# Patient Record
Sex: Female | Born: 1965
Health system: Southern US, Community
[De-identification: ages and names within clinical notes are randomized; demographics above are authoritative.]

## PROBLEM LIST (undated history)

## (undated) DIAGNOSIS — M48061 Spinal stenosis, lumbar region without neurogenic claudication: Secondary | ICD-10-CM

## (undated) DIAGNOSIS — E785 Hyperlipidemia, unspecified: Secondary | ICD-10-CM

## (undated) DIAGNOSIS — A159 Respiratory tuberculosis unspecified: Secondary | ICD-10-CM

## (undated) DIAGNOSIS — M1990 Unspecified osteoarthritis, unspecified site: Secondary | ICD-10-CM

## (undated) DIAGNOSIS — I10 Essential (primary) hypertension: Secondary | ICD-10-CM

## (undated) DIAGNOSIS — G709 Myoneural disorder, unspecified: Secondary | ICD-10-CM

## (undated) DIAGNOSIS — E119 Type 2 diabetes mellitus without complications: Secondary | ICD-10-CM

## (undated) DIAGNOSIS — K219 Gastro-esophageal reflux disease without esophagitis: Secondary | ICD-10-CM

## (undated) DIAGNOSIS — Z9109 Other allergy status, other than to drugs and biological substances: Secondary | ICD-10-CM

## (undated) DIAGNOSIS — I499 Cardiac arrhythmia, unspecified: Secondary | ICD-10-CM

## (undated) HISTORY — DX: Hyperlipidemia, unspecified: E78.5

## (undated) HISTORY — DX: Unspecified osteoarthritis, unspecified site: M19.90

## (undated) HISTORY — DX: Type 2 diabetes mellitus without complications: E11.9

## (undated) HISTORY — PX: HAND SURGERY: SHX662

## (undated) HISTORY — DX: Spinal stenosis, lumbar region without neurogenic claudication: M48.061

## (undated) HISTORY — PX: ANKLE SURGERY: SHX546

---

## 2001-04-02 ENCOUNTER — Emergency Department (HOSPITAL_COMMUNITY): Admission: EM | Admit: 2001-04-02 | Discharge: 2001-04-02 | Payer: Self-pay | Admitting: Emergency Medicine

## 2001-07-13 ENCOUNTER — Other Ambulatory Visit: Admission: RE | Admit: 2001-07-13 | Discharge: 2001-07-13 | Payer: Self-pay | Admitting: Family Medicine

## 2001-12-16 ENCOUNTER — Emergency Department (HOSPITAL_COMMUNITY): Admission: EM | Admit: 2001-12-16 | Discharge: 2001-12-16 | Payer: Self-pay | Admitting: Emergency Medicine

## 2001-12-16 ENCOUNTER — Encounter: Payer: Self-pay | Admitting: Emergency Medicine

## 2001-12-29 ENCOUNTER — Emergency Department (HOSPITAL_COMMUNITY): Admission: EM | Admit: 2001-12-29 | Discharge: 2001-12-29 | Payer: Self-pay | Admitting: Emergency Medicine

## 2002-01-26 ENCOUNTER — Emergency Department (HOSPITAL_COMMUNITY): Admission: EM | Admit: 2002-01-26 | Discharge: 2002-01-26 | Payer: Self-pay | Admitting: Emergency Medicine

## 2002-02-13 ENCOUNTER — Emergency Department (HOSPITAL_COMMUNITY): Admission: EM | Admit: 2002-02-13 | Discharge: 2002-02-13 | Payer: Self-pay | Admitting: Emergency Medicine

## 2002-02-25 ENCOUNTER — Emergency Department (HOSPITAL_COMMUNITY): Admission: EM | Admit: 2002-02-25 | Discharge: 2002-02-25 | Payer: Self-pay | Admitting: Emergency Medicine

## 2002-08-30 ENCOUNTER — Ambulatory Visit (HOSPITAL_COMMUNITY): Admission: RE | Admit: 2002-08-30 | Discharge: 2002-08-30 | Payer: Self-pay | Admitting: Neurology

## 2002-12-10 ENCOUNTER — Encounter: Payer: Self-pay | Admitting: Emergency Medicine

## 2002-12-10 ENCOUNTER — Emergency Department (HOSPITAL_COMMUNITY): Admission: EM | Admit: 2002-12-10 | Discharge: 2002-12-10 | Payer: Self-pay | Admitting: Emergency Medicine

## 2003-03-15 ENCOUNTER — Emergency Department (HOSPITAL_COMMUNITY): Admission: EM | Admit: 2003-03-15 | Discharge: 2003-03-15 | Payer: Self-pay | Admitting: Emergency Medicine

## 2003-06-10 ENCOUNTER — Emergency Department (HOSPITAL_COMMUNITY): Admission: EM | Admit: 2003-06-10 | Discharge: 2003-06-10 | Payer: Self-pay | Admitting: Emergency Medicine

## 2003-07-16 ENCOUNTER — Emergency Department (HOSPITAL_COMMUNITY): Admission: EM | Admit: 2003-07-16 | Discharge: 2003-07-16 | Payer: Self-pay | Admitting: Emergency Medicine

## 2003-08-07 ENCOUNTER — Emergency Department (HOSPITAL_COMMUNITY): Admission: EM | Admit: 2003-08-07 | Discharge: 2003-08-07 | Payer: Self-pay | Admitting: Emergency Medicine

## 2003-08-17 ENCOUNTER — Encounter: Admission: RE | Admit: 2003-08-17 | Discharge: 2003-11-15 | Payer: Self-pay | Admitting: *Deleted

## 2003-11-07 ENCOUNTER — Emergency Department (HOSPITAL_COMMUNITY): Admission: EM | Admit: 2003-11-07 | Discharge: 2003-11-07 | Payer: Self-pay | Admitting: Emergency Medicine

## 2003-11-19 ENCOUNTER — Emergency Department (HOSPITAL_COMMUNITY): Admission: EM | Admit: 2003-11-19 | Discharge: 2003-11-19 | Payer: Self-pay | Admitting: Emergency Medicine

## 2004-01-28 ENCOUNTER — Emergency Department (HOSPITAL_COMMUNITY): Admission: EM | Admit: 2004-01-28 | Discharge: 2004-01-28 | Payer: Self-pay | Admitting: Emergency Medicine

## 2004-06-11 ENCOUNTER — Emergency Department (HOSPITAL_COMMUNITY): Admission: EM | Admit: 2004-06-11 | Discharge: 2004-06-11 | Payer: Self-pay | Admitting: Emergency Medicine

## 2004-12-15 ENCOUNTER — Emergency Department (HOSPITAL_COMMUNITY): Admission: EM | Admit: 2004-12-15 | Discharge: 2004-12-15 | Payer: Self-pay | Admitting: Emergency Medicine

## 2005-11-28 ENCOUNTER — Encounter: Admission: RE | Admit: 2005-11-28 | Discharge: 2005-11-28 | Payer: Self-pay | Admitting: Otolaryngology

## 2005-12-03 ENCOUNTER — Encounter: Admission: RE | Admit: 2005-12-03 | Discharge: 2005-12-03 | Payer: Self-pay | Admitting: Otolaryngology

## 2006-01-11 ENCOUNTER — Emergency Department (HOSPITAL_COMMUNITY): Admission: EM | Admit: 2006-01-11 | Discharge: 2006-01-11 | Payer: Self-pay | Admitting: Emergency Medicine

## 2006-01-13 ENCOUNTER — Emergency Department (HOSPITAL_COMMUNITY): Admission: EM | Admit: 2006-01-13 | Discharge: 2006-01-13 | Payer: Self-pay | Admitting: Emergency Medicine

## 2006-03-04 HISTORY — PX: FOOT SURGERY: SHX648

## 2006-03-06 ENCOUNTER — Emergency Department (HOSPITAL_COMMUNITY): Admission: EM | Admit: 2006-03-06 | Discharge: 2006-03-06 | Payer: Self-pay | Admitting: Family Medicine

## 2006-03-17 ENCOUNTER — Emergency Department (HOSPITAL_COMMUNITY): Admission: EM | Admit: 2006-03-17 | Discharge: 2006-03-17 | Payer: Self-pay | Admitting: Emergency Medicine

## 2006-09-03 ENCOUNTER — Ambulatory Visit (HOSPITAL_BASED_OUTPATIENT_CLINIC_OR_DEPARTMENT_OTHER): Admission: RE | Admit: 2006-09-03 | Discharge: 2006-09-04 | Payer: Self-pay | Admitting: Orthopedic Surgery

## 2006-11-28 ENCOUNTER — Encounter: Admission: RE | Admit: 2006-11-28 | Discharge: 2006-11-28 | Payer: Self-pay | Admitting: Internal Medicine

## 2007-03-18 ENCOUNTER — Emergency Department (HOSPITAL_COMMUNITY): Admission: EM | Admit: 2007-03-18 | Discharge: 2007-03-18 | Payer: Self-pay | Admitting: *Deleted

## 2008-03-04 HISTORY — PX: KNEE SURGERY: SHX244

## 2008-04-07 ENCOUNTER — Encounter: Admission: RE | Admit: 2008-04-07 | Discharge: 2008-04-07 | Payer: Self-pay | Admitting: Cardiology

## 2008-06-02 ENCOUNTER — Encounter: Admission: RE | Admit: 2008-06-02 | Discharge: 2008-06-02 | Payer: Self-pay | Admitting: Orthopedic Surgery

## 2008-07-26 ENCOUNTER — Encounter: Admission: RE | Admit: 2008-07-26 | Discharge: 2008-10-24 | Payer: Self-pay | Admitting: Orthopedic Surgery

## 2009-03-01 ENCOUNTER — Ambulatory Visit (HOSPITAL_COMMUNITY): Admission: RE | Admit: 2009-03-01 | Discharge: 2009-03-01 | Payer: Self-pay | Admitting: Cardiology

## 2009-03-18 ENCOUNTER — Emergency Department (HOSPITAL_COMMUNITY): Admission: EM | Admit: 2009-03-18 | Discharge: 2009-03-18 | Payer: Self-pay | Admitting: Emergency Medicine

## 2009-05-26 ENCOUNTER — Encounter (HOSPITAL_COMMUNITY): Admission: RE | Admit: 2009-05-26 | Discharge: 2009-08-02 | Payer: Self-pay | Admitting: Cardiology

## 2009-10-25 ENCOUNTER — Emergency Department (HOSPITAL_COMMUNITY): Admission: EM | Admit: 2009-10-25 | Discharge: 2009-10-26 | Payer: Self-pay | Admitting: Emergency Medicine

## 2009-12-13 ENCOUNTER — Emergency Department (HOSPITAL_COMMUNITY): Admission: EM | Admit: 2009-12-13 | Discharge: 2009-12-13 | Payer: Self-pay | Admitting: Emergency Medicine

## 2010-03-17 ENCOUNTER — Emergency Department (HOSPITAL_COMMUNITY)
Admission: EM | Admit: 2010-03-17 | Discharge: 2010-03-17 | Payer: Self-pay | Source: Home / Self Care | Admitting: Emergency Medicine

## 2010-03-19 LAB — URINALYSIS, ROUTINE W REFLEX MICROSCOPIC
Bilirubin Urine: NEGATIVE
Ketones, ur: 15 mg/dL — AB
Leukocytes, UA: NEGATIVE
Nitrite: NEGATIVE
Protein, ur: NEGATIVE mg/dL
Specific Gravity, Urine: 1.028 (ref 1.005–1.030)
Urine Glucose, Fasting: >1000 mg/dL — AB
Urobilinogen, UA: 0.2 mg/dL (ref 0.0–1.0)
pH: 6 (ref 5.0–8.0)

## 2010-03-19 LAB — URINE MICROSCOPIC-ADD ON

## 2010-05-17 LAB — GLUCOSE, CAPILLARY: Glucose-Capillary: 145 mg/dL — ABNORMAL HIGH (ref 70–99)

## 2010-05-20 LAB — URINALYSIS, ROUTINE W REFLEX MICROSCOPIC
Bilirubin Urine: NEGATIVE
Glucose, UA: >1000 mg/dL — AB
Hgb urine dipstick: NEGATIVE
Ketones, ur: NEGATIVE mg/dL
Leukocytes, UA: NEGATIVE
Nitrite: NEGATIVE
Protein, ur: NEGATIVE mg/dL
Specific Gravity, Urine: 1.025 (ref 1.005–1.030)
Urobilinogen, UA: 1 mg/dL (ref 0.0–1.0)
pH: 7 (ref 5.0–8.0)

## 2010-05-20 LAB — DIFFERENTIAL
Basophils Absolute: 0 10*3/uL (ref 0.0–0.1)
Basophils Relative: 0 % (ref 0–1)
Eosinophils Relative: 2 % (ref 0–5)
Lymphocytes Relative: 23 % (ref 12–46)
Monocytes Absolute: 0.7 10*3/uL (ref 0.1–1.0)
Monocytes Relative: 5 % (ref 3–12)

## 2010-05-20 LAB — BASIC METABOLIC PANEL
CO2: 24 mEq/L (ref 19–32)
Glucose, Bld: 328 mg/dL — ABNORMAL HIGH (ref 70–99)
Potassium: 3.8 mEq/L (ref 3.5–5.1)
Sodium: 132 mEq/L — ABNORMAL LOW (ref 135–145)

## 2010-05-20 LAB — CBC
HCT: 40.8 % (ref 36.0–46.0)
Hemoglobin: 13.8 g/dL (ref 12.0–15.0)
MCHC: 33.8 g/dL (ref 30.0–36.0)
RDW: 13.6 % (ref 11.5–15.5)

## 2010-05-20 LAB — GLUCOSE, CAPILLARY
Glucose-Capillary: 209 mg/dL — ABNORMAL HIGH (ref 70–99)
Glucose-Capillary: 251 mg/dL — ABNORMAL HIGH (ref 70–99)
Glucose-Capillary: 313 mg/dL — ABNORMAL HIGH (ref 70–99)

## 2010-06-03 ENCOUNTER — Emergency Department (HOSPITAL_COMMUNITY)
Admission: EM | Admit: 2010-06-03 | Discharge: 2010-06-04 | Disposition: A | Discharge status: Home / Self Care | Payer: Managed Care, Other (non HMO) | Attending: Emergency Medicine | Admitting: Emergency Medicine

## 2010-06-03 ENCOUNTER — Emergency Department (HOSPITAL_COMMUNITY): Payer: Managed Care, Other (non HMO)

## 2010-06-03 DIAGNOSIS — M1990 Unspecified osteoarthritis, unspecified site: Secondary | ICD-10-CM | POA: Insufficient documentation

## 2010-06-03 DIAGNOSIS — M25469 Effusion, unspecified knee: Secondary | ICD-10-CM | POA: Insufficient documentation

## 2010-06-03 DIAGNOSIS — M25569 Pain in unspecified knee: Secondary | ICD-10-CM | POA: Insufficient documentation

## 2010-06-03 DIAGNOSIS — I10 Essential (primary) hypertension: Secondary | ICD-10-CM | POA: Insufficient documentation

## 2010-06-03 DIAGNOSIS — E119 Type 2 diabetes mellitus without complications: Secondary | ICD-10-CM | POA: Insufficient documentation

## 2010-06-03 DIAGNOSIS — M779 Enthesopathy, unspecified: Secondary | ICD-10-CM | POA: Insufficient documentation

## 2010-06-04 LAB — COMPREHENSIVE METABOLIC PANEL
ALT: 22 U/L (ref 0–35)
AST: 17 U/L (ref 0–37)
Albumin: 4.1 g/dL (ref 3.5–5.2)
Alkaline Phosphatase: 89 U/L (ref 39–117)
BUN: 10 mg/dL (ref 6–23)
Chloride: 100 mEq/L (ref 96–112)
Potassium: 4 mEq/L (ref 3.5–5.1)
Sodium: 137 mEq/L (ref 135–145)
Total Bilirubin: 0.6 mg/dL (ref 0.3–1.2)
Total Protein: 7.2 g/dL (ref 6.0–8.3)

## 2010-06-04 LAB — HEMOGLOBIN A1C: Mean Plasma Glucose: 200 mg/dL

## 2010-06-04 LAB — CBC
HCT: 41.3 % (ref 36.0–46.0)
Platelets: 379 10*3/uL (ref 150–400)
WBC: 14.3 10*3/uL — ABNORMAL HIGH (ref 4.0–10.5)

## 2010-06-04 LAB — LIPID PANEL
HDL: 35 mg/dL — ABNORMAL LOW (ref >39)
Total CHOL/HDL Ratio: 4.2 RATIO
Triglycerides: 102 mg/dL (ref <150)
VLDL: 20 mg/dL (ref 0–40)

## 2010-07-20 NOTE — Op Note (Signed)
Joy Solis, [name]               ACCOUNT NO.:  [id number]   MEDICAL RECORD NO.:  [id number]          PATIENT TYPE:  AMB   LOCATION:  DSC                          FACILITY:  [facility]   PHYSICIAN:  Leonides Grills, M.D.     DATE OF BIRTH:  Jun 07, 1965   DATE OF PROCEDURE:  08/04/2006  DATE OF DISCHARGE:                               OPERATIVE REPORT   PREOPERATIVE DIAGNOSES:  1. Right hindfoot valgus malalignment.  2. Right tarsal tunnel syndrome.  3. Right tight gastroc.  4. Right grade 2 posterior tibial tendonitis/insufficiency.   POSTOPERATIVE DIAGNOSES:  1. Right hindfoot valgus malalignment.  2. Right tarsal tunnel syndrome.  3. Right tight gastroc.  4. Right grade 2 posterior tibial tendonitis/insufficiency.   OPERATION:  1. Right tarsal tunnel release.  2. Right lengthening calcaneal osteotomy with femoral head allograft.  3. Right FDL to navicular tendon transfer.  4. Right gastroc slide.  5. Stress x-rays right foot.   ANESTHESIA:  General.   SURGEON:  Leonides Grills, MD.   ASSISTANT:  Evlyn Kanner, PA-C.   ESTIMATED BLOOD LOSS:  Minimal.   TOURNIQUET TIME:  Approximately 10 minutes.   COMPLICATIONS:  None.   DISPOSITION:  Stable to PR.   INDICATIONS:  This is a 44 year old female who is obese and has had  prolonged right posteromedial ankle pain that is interfering with her  life __________  despite conservative management.  She was consented for  the above procedure.  All risks which include infection, neurovessel  injury, nonunion, malunion, hardware rotation, hardware failure,  stiffness, arthritis, persistent pain, worsening pain, prolonged  recovery, lateral column pain, lateral foot numbness specifically were  all explained, questions were encouraged and answered.   DESCRIPTION OF PROCEDURE:  The patient was brought to the operating  room, placed in supine position initially after adequate general  endotracheal tube anesthesia was administered with  popliteal block as  well as Ancef 1 gram IV piggyback.  A bump was placed on the right  ipsilateral hip using a beanbag, all bony prominences well padded and  the right lower extremity was then prepped and draped in a sterile  manner over  a proximally placed thigh tourniquet. The limb was gravity  exsanguinated, tourniquet was elevated to 290 mmHg. A longitudinal  incision on the medial aspect of the gastrocnemius muscle tendon  junction was then made and dissection was carried down through the ski,  hemostasis was obtained. The conjoined region was then developed between  the gastroc and soleus, soft tissue was then elevated off the posterior  aspect of the gastrocnemius, sural nerve was identified and protected  throughout the case.  The gastrocnemius was then released with the  curved Mayo scissors.  This had excellent release tight gastroc due to  the venous tourniquet.  The tourniquet was deflated.  Hemostasis was  obtained.  Bleeding stopped immediately. The area was copiously  irrigated with normal saline, subcu was closed with 3-0 Vicryl.  Skin  was closed with 4-0 Monocryl subcuticular stitch.  Steri-Strips were  applied.  We then made a curvilinear incision over the posteromedial  aspect of the right ankle and dissection was carried down through the  skin and hemostasis was obtained.  Flexor retinaculum was identified and  this was carefully dissected out releasing the tarsal tunnel.  Once this  was completely released, we then identified the FDL tendon, incised the  sheath longitudinally and there was a large amount of synovitis in the  tendon sheath and a formal tenosynovectomy was then performed of the  posterior tibial tendon . the FDL tendon was then identified and traced  to the knot of Henry and then tenotomized as distal as possible. A 3.5  to 4.5 mm drill hole was then placed in the navicular tuberosity and  then using a Swanson suture passer with 2-0 fiber loop stitch  in the FDL  tendon, the FDL tendon was then transferred to the navicular from  plantar to dorsal through the drill hole.  This was sewn to the  posterior tibial tendon and reconstructed and transferred with 2-0  FiberWire stitch and this had outstanding repair. We then made a  longitudinal incision over the right calcaneal neck area.  Dissection  was carried down through the skin, hemostasis was obtained.  EDB was  then elevated off the superior aspect of the calcaneal neck area and CC  joint was identified. The peroneal tendons were also identified and  protected with Homan retractors.  1.2 cm proximal to the CC joint, this  was marked and an osteotomy was then made perpendicular to the lateral  wall of the calcaneus parallel to the CC joint using a sagittal saw.  Once the calcaneus was osteotomized, a  laminar spreader was then placed  and then we essentially dialed in the arch height and measured this to  be about 8 mm. Then using a femoral head allograft, an 8 mm based  trapezoidal shaped graft was then fashioned on the back table.  This was  then tamped into place with the aid of a lamina spreader and retractors.  The peroneal tendons were obviously out of harm's way.  Once this was  tamped into place and the distal aspect of the calcaneus was in an  anatomic position as well and the graft was flushed with the lateral  wall of the calcaneus and palpated through the sinus tarsi area to make  sure that there was no prominence superiorly.  This was then fixed with  a 3.5-mm fully threaded cortical set screw using a 2.5-mm drill hole  respectively.  This was drilled from the anterior process of the  calcaneus across the graft site.  This had excellent purchase and  maintenance of the lengthening.  Final stress x-rays were obtained in  the AP and lateral planes as well as axial views and show no gross  motion across the osteotomy site, fixation and proposition excellent  alignment as  well.  AP talocalcaneal angle was restored to normal.  We  also restored the medial coverage of the talar head and the calcaneal  pitch was also restored to normal as well.  The area was copiously  irrigated with  normal saline. The flexor retinaculum over the posterior  tibial tendon was closed with a 2-0 Vicryl, subcu was closed with 3-0  Vicryl over all wounds.  The skin was closed with 4-0 nylon.  A sterile  dressing was applied. A modified Jones dressing was applied with the  ankle in neutral dorsiflexion.  The patient was stable to the PR.      Leonides Grills, M.D.  Electronically Signed     PB/MEDQ  D:  09/03/2006  T:  09/03/2006  Job:  [id number]

## 2010-10-23 ENCOUNTER — Emergency Department (HOSPITAL_COMMUNITY)
Admission: EM | Admit: 2010-10-23 | Discharge: 2010-10-23 | Disposition: A | Discharge status: Home / Self Care | Payer: Managed Care, Other (non HMO) | Attending: Emergency Medicine | Admitting: Emergency Medicine

## 2010-10-23 DIAGNOSIS — M79609 Pain in unspecified limb: Secondary | ICD-10-CM | POA: Insufficient documentation

## 2010-10-23 DIAGNOSIS — Z794 Long term (current) use of insulin: Secondary | ICD-10-CM | POA: Insufficient documentation

## 2010-10-23 DIAGNOSIS — M1990 Unspecified osteoarthritis, unspecified site: Secondary | ICD-10-CM | POA: Insufficient documentation

## 2010-10-23 DIAGNOSIS — I10 Essential (primary) hypertension: Secondary | ICD-10-CM | POA: Insufficient documentation

## 2010-10-23 DIAGNOSIS — E7800 Pure hypercholesterolemia, unspecified: Secondary | ICD-10-CM | POA: Insufficient documentation

## 2010-10-23 DIAGNOSIS — E119 Type 2 diabetes mellitus without complications: Secondary | ICD-10-CM | POA: Insufficient documentation

## 2010-10-23 DIAGNOSIS — M25559 Pain in unspecified hip: Secondary | ICD-10-CM | POA: Insufficient documentation

## 2010-10-23 DIAGNOSIS — M76899 Other specified enthesopathies of unspecified lower limb, excluding foot: Secondary | ICD-10-CM | POA: Insufficient documentation

## 2010-11-23 ENCOUNTER — Emergency Department (HOSPITAL_COMMUNITY)
Admission: EM | Admit: 2010-11-23 | Discharge: 2010-11-23 | Disposition: A | Discharge status: Home / Self Care | Payer: Self-pay | Attending: Emergency Medicine | Admitting: Emergency Medicine

## 2010-11-23 DIAGNOSIS — Z794 Long term (current) use of insulin: Secondary | ICD-10-CM | POA: Insufficient documentation

## 2010-11-23 DIAGNOSIS — I10 Essential (primary) hypertension: Secondary | ICD-10-CM | POA: Insufficient documentation

## 2010-11-23 DIAGNOSIS — E119 Type 2 diabetes mellitus without complications: Secondary | ICD-10-CM | POA: Insufficient documentation

## 2010-11-23 DIAGNOSIS — M5430 Sciatica, unspecified side: Secondary | ICD-10-CM | POA: Insufficient documentation

## 2010-11-23 DIAGNOSIS — M5412 Radiculopathy, cervical region: Secondary | ICD-10-CM | POA: Insufficient documentation

## 2010-11-30 ENCOUNTER — Ambulatory Visit (HOSPITAL_COMMUNITY): Payer: Managed Care, Other (non HMO)

## 2010-11-30 ENCOUNTER — Inpatient Hospital Stay (INDEPENDENT_AMBULATORY_CARE_PROVIDER_SITE_OTHER)
Admission: RE | Admit: 2010-11-30 | Discharge: 2010-11-30 | Disposition: A | Discharge status: Home / Self Care | Payer: Self-pay | Source: Ambulatory Visit | Attending: Family Medicine | Admitting: Family Medicine

## 2010-11-30 DIAGNOSIS — L03019 Cellulitis of unspecified finger: Secondary | ICD-10-CM

## 2010-12-02 ENCOUNTER — Inpatient Hospital Stay (INDEPENDENT_AMBULATORY_CARE_PROVIDER_SITE_OTHER)
Admission: RE | Admit: 2010-12-02 | Discharge: 2010-12-02 | Disposition: A | Discharge status: Home / Self Care | Payer: Managed Care, Other (non HMO) | Source: Ambulatory Visit | Attending: Emergency Medicine | Admitting: Emergency Medicine

## 2010-12-02 DIAGNOSIS — IMO0002 Reserved for concepts with insufficient information to code with codable children: Secondary | ICD-10-CM

## 2010-12-02 DIAGNOSIS — L03019 Cellulitis of unspecified finger: Secondary | ICD-10-CM

## 2010-12-18 LAB — POCT HEMOGLOBIN-HEMACUE
Hemoglobin: 14.3
Operator id: 123881

## 2011-05-17 ENCOUNTER — Emergency Department (INDEPENDENT_AMBULATORY_CARE_PROVIDER_SITE_OTHER)
Admission: EM | Admit: 2011-05-17 | Discharge: 2011-05-17 | Disposition: A | Discharge status: Home / Self Care | Payer: Managed Care, Other (non HMO) | Source: Home / Self Care

## 2011-05-17 ENCOUNTER — Encounter (HOSPITAL_COMMUNITY): Payer: Self-pay | Admitting: *Deleted

## 2011-05-17 DIAGNOSIS — J019 Acute sinusitis, unspecified: Secondary | ICD-10-CM

## 2011-05-17 DIAGNOSIS — J209 Acute bronchitis, unspecified: Secondary | ICD-10-CM

## 2011-05-17 HISTORY — DX: Essential (primary) hypertension: I10

## 2011-05-17 HISTORY — DX: Other allergy status, other than to drugs and biological substances: Z91.09

## 2011-05-17 MED ORDER — GUAIFENESIN-CODEINE 100-10 MG/5ML PO SYRP: ORAL_SOLUTION | ORAL | Status: AC

## 2011-05-17 MED ORDER — AMOXICILLIN-POT CLAVULANATE 875-125 MG PO TABS: 1.0000 | ORAL_TABLET | Freq: Two times a day (BID) | ORAL | Status: AC

## 2011-05-17 NOTE — ED Provider Notes (Signed)
History     CSN: [id number]  Arrival date & time 05/17/11  1822   None     Chief Complaint  Patient presents with  . Cough  . Nasal Congestion  . Sinusitis  . Generalized Body Aches    (Consider location/radiation/quality/duration/timing/severity/associated sxs/prior treatment) HPI Comments: Patient presents today with complaints of 5 days of sinus pressure, and yellow nasal congestion. She states since onset  it has moved down to her chest and she now has a productive cough with yellowish sputum. She is also beginning to develop some right ear discomfort. She has myalgias and headache. No fever. She has tried various over-the-counter cough and cold medications without relief.   Past Medical History  Diagnosis Date  . Hypertension   . Diabetes mellitus   . Environmental allergies     Past Surgical History  Procedure Date  . Knee surgery   . Hand surgery   . Ankle surgery     History reviewed. No pertinent family history.  History  Substance Use Topics  . Smoking status: Current Everyday Smoker -- 1.0 packs/day  . Smokeless tobacco: Not on file  . Alcohol Use: No    OB History    Grav Para Term Preterm Abortions TAB SAB Ect Mult Living                  Review of Systems  Constitutional: Negative for fever, chills, appetite change and fatigue.  HENT: Positive for ear pain, congestion, rhinorrhea, postnasal drip and sinus pressure. Negative for sore throat.   Respiratory: Positive for cough. Negative for shortness of breath and wheezing.   Cardiovascular: Negative for chest pain.  Gastrointestinal: Negative for nausea, vomiting and diarrhea.    Allergies  Review of patient's allergies indicates no known allergies.  Home Medications   Current Outpatient Rx  Name Route Sig Dispense Refill  . ASPIRIN 81 MG PO TABS Oral Take 81 mg by mouth daily.    . OMEGA-3 FATTY ACIDS 1000 MG PO CAPS Oral Take 2 g by mouth daily.    Marland Kitchen GLIMEPIRIDE 2 MG PO TABS Oral Take 2  mg by mouth daily before breakfast.    . INSULIN GLARGINE 100 UNIT/ML [place] SOLN Subcutaneous Inject into the skin at bedtime.    Marland Kitchen LIRAGLUTIDE [place] Subcutaneous Inject into the skin.    Marland Kitchen METFORMIN HCL PO Oral Take by mouth.    Marland Kitchen PAROXETINE HCL 20 MG PO TABS Oral Take 20 mg by mouth every morning.    Marland Kitchen UNKNOWN TO PATIENT  HTN med    . AMOXICILLIN-POT CLAVULANATE 875-125 MG PO TABS Oral Take 1 tablet by mouth 2 (two) times daily with a meal. 20 tablet 0  . GUAIFENESIN-CODEINE 100-10 MG/5ML PO SYRP  1-2 tsp every 6 hrs prn cough 120 mL 0    BP 138/87  Pulse 119  Temp(Src) 99.6 F (37.6 C) (Oral)  Resp 18  SpO2 97%  LMP 05/05/2011  Physical Exam  Nursing note and vitals reviewed. Constitutional: She appears well-developed and well-nourished. No distress.  HENT:  Head: Normocephalic and atraumatic.  Right Ear: Tympanic membrane, external ear and ear canal normal.  Left Ear: Tympanic membrane, external ear and ear canal normal.  Nose: Right sinus exhibits maxillary sinus tenderness. Left sinus exhibits maxillary sinus tenderness.  Mouth/Throat: Uvula is midline, oropharynx is clear and moist and mucous membranes are normal. No oropharyngeal exudate, posterior oropharyngeal edema or posterior oropharyngeal erythema.  Neck: Neck supple.  Cardiovascular: Normal rate, regular  rhythm and normal heart sounds.   Pulmonary/Chest: Effort normal and breath sounds normal. No respiratory distress.  Lymphadenopathy:    She has no cervical adenopathy.  Neurological: She is alert.  Skin: Skin is warm and dry.  Psychiatric: She has a normal mood and affect.    ED Course  Procedures (including critical care time)  Labs Reviewed - No data to display No results found.   1. Acute sinusitis   2. Acute bronchitis       MDM          Melody Comas, [place] 05/17/11 2053

## 2011-05-17 NOTE — ED Notes (Signed)
C/O head congestion, HA, earache, eye pain x 5 days, which has progressed into sore throat, productive cough, body aches.  Has been taking Alka Seltzer Severe Sinus & Cold, Robitussin DM, phenylephrine, diphenhydramine without relief.

## 2011-05-17 NOTE — Discharge Instructions (Signed)
Increase fluids and rest. Avoid caffeinated beverages. Tylenol or Ibuprofen as needed for discomfort. Do not drive or operate machinery while taking the prescription cough medication.   Acute Bronchitis Bronchitis is when the organs and tissues involved in breathing get puffy (swollen) and can leak fluid. This makes it harder for air to get in and out of the lungs. You may cough a lot and produce thick spit (mucus). Acute means the illness started suddenly. HOME CARE  Rest.   Drink enough fluids to keep the pee (urine) clear or pale yellow.   Medicines may be given that will open up your airways to help you breathe better. Only take medicine as told by your doctor.   Use a cool mist vaporizer. This will help to thin any thick spit.   Do not smoke. Avoid secondhand smoke.  GET HELP RIGHT AWAY IF:   You have a temperature by mouth above 102 F (38.9 C), not controlled by medicine.   You have chills.   You develop severe shortness of breath or chest pain.   You have bloody spit mixed with mucus (sputum).   You throw up (vomit) often.   You lose too much body fluid (dehydrated).   You have a severe headache.   You feel faint.   You do not improve after 1 week of treatment.  MAKE SURE YOU:   Understand these instructions.   Will watch your condition.   Will get help right away if you are not doing well or get worse.  Document Released: 08/07/2007 Document Revised: 02/07/2011 Document Reviewed: 03/08/2009 [facility] Patient Information 2012 [place], [place]. Sinusitis Sinusitis an infection of the air pockets (sinuses) in your face. This can cause puffiness (swelling). It can also cause drainage from your sinuses.  HOME CARE   Only take medicine as told by your doctor.   Drink enough fluids to keep your pee (urine) clear or pale yellow.   Apply moist heat or ice packs for pain relief.   Use salt (saline) nose sprays. The spray will wet the thick fluid in the nose. This  can help the sinuses drain.  GET HELP RIGHT AWAY IF:   You have a fever.   Your baby is older than 3 months with a rectal temperature of 102 F (38.9 C) or higher.   Your baby is 99 months old or younger with a rectal temperature of 100.4 F (38 C) or higher.   The pain gets worse.   You get a very bad headache.   You keep throwing up (vomiting).   Your face gets puffy.  MAKE SURE YOU:   Understand these instructions.   Will watch your condition.   Will get help right away if you are not doing well or get worse.  Document Released: 08/07/2007 Document Revised: 02/07/2011 Document Reviewed: 08/07/2007 [facility] Patient Information 2012 [place], [place].

## 2011-05-18 NOTE — ED Provider Notes (Signed)
Medical screening examination/treatment/procedure(s) were performed by non-physician practitioner and as supervising physician I was immediately available for consultation/collaboration.   MORENO-COLL,Mariabelen Pressly; MD   Marthann Abshier Moreno-Coll, MD 05/18/11 1127 

## 2011-07-18 ENCOUNTER — Ambulatory Visit (HOSPITAL_BASED_OUTPATIENT_CLINIC_OR_DEPARTMENT_OTHER)
Admission: RE | Admit: 2011-07-18 | Payer: Managed Care, Other (non HMO) | Source: Ambulatory Visit | Admitting: Orthopedic Surgery

## 2011-07-18 ENCOUNTER — Encounter (HOSPITAL_BASED_OUTPATIENT_CLINIC_OR_DEPARTMENT_OTHER): Admission: RE | Payer: Self-pay | Source: Ambulatory Visit

## 2011-07-18 SURGERY — CARPAL TUNNEL RELEASE
Anesthesia: Monitor Anesthesia Care | Laterality: Left

## 2011-11-01 ENCOUNTER — Emergency Department (INDEPENDENT_AMBULATORY_CARE_PROVIDER_SITE_OTHER)
Admission: EM | Admit: 2011-11-01 | Discharge: 2011-11-01 | Disposition: A | Discharge status: Home / Self Care | Payer: 59 | Source: Home / Self Care | Attending: Emergency Medicine | Admitting: Emergency Medicine

## 2011-11-01 ENCOUNTER — Encounter (HOSPITAL_COMMUNITY): Payer: Self-pay

## 2011-11-01 ENCOUNTER — Emergency Department (INDEPENDENT_AMBULATORY_CARE_PROVIDER_SITE_OTHER): Payer: 59

## 2011-11-01 DIAGNOSIS — M5430 Sciatica, unspecified side: Secondary | ICD-10-CM

## 2011-11-01 DIAGNOSIS — M779 Enthesopathy, unspecified: Secondary | ICD-10-CM

## 2011-11-01 MED ORDER — TRAMADOL HCL 50 MG PO TABS
100.0000 mg | ORAL_TABLET | Freq: Three times a day (TID) | ORAL | Status: AC | PRN
Start: 2011-11-01 — End: 2011-11-11

## 2011-11-01 MED ORDER — METHYLPREDNISOLONE ACETATE 80 MG/ML IJ SUSP
INTRAMUSCULAR | Status: AC
  Filled 2011-11-01: qty 1

## 2011-11-01 MED ORDER — METHYLPREDNISOLONE ACETATE 80 MG/ML IJ SUSP
80.0000 mg | Freq: Once | INTRAMUSCULAR | Status: AC
  Administered 2011-11-01: 80 mg via INTRAMUSCULAR

## 2011-11-01 MED ORDER — PREDNISONE 5 MG PO KIT: 1.0000 | PACK | Freq: Every day | ORAL | Status: DC

## 2011-11-01 MED ORDER — MELOXICAM 15 MG PO TABS: 15.0000 mg | ORAL_TABLET | Freq: Every day | ORAL | Status: AC

## 2011-11-01 NOTE — ED Provider Notes (Signed)
Chief Complaint  Patient presents with  . Hip Pain  . Ankle Pain    History of Present Illness:   Joy Solis is a 46 year old female who has had a two-week history of left lateral ankle pain. She denies any injury. There has been swelling and numbness. It hurts when she walks and is better when she gets off her feet. She denies any pain or swelling medially over the foot. She also has a 3-4 day history of symptoms of sciatica with pain that begins in the left buttock and radiates down to the calf. She's had this off and on for the past year. She's been to the emergency room a couple times for it. She usually gets relief with a corticosteroid injection plus a prednisone taper. Her entire left leg feels weak and numb. She denies any bladder or bowel dysfunction. She's had no fever, chills, abdominal pain, unintentional weight loss. She does have a history of diabetes and high blood pressure.  Review of Systems:  Other than noted above, the patient denies any of the following symptoms: Systemic:  No fevers, chills, sweats, or aches.  No fatigue or tiredness. Musculoskeletal:  No joint pain, arthritis, bursitis, swelling, back pain, or neck pain. Neurological:  No muscular weakness, paresthesias, headache, or trouble with speech or coordination.  No dizziness.  PMFSH:  Past medical history, family history, social history, meds, and allergies were reviewed.  Physical Exam:   Vital signs:  BP 134/90  Pulse 90  Temp 98.5 F (36.9 C) (Oral)  Resp 20  SpO2 98%  LMP 10/20/2011 Gen:  Alert and oriented times 3.  In no distress. Musculoskeletal: Exam of the ankle reveals a little bit of puffiness over the lateral malleolus and pain over the malleolus and anterior to it but not posterior to it. The ankle has a full range of motion but with pain on movement. Otherwise, all joints had a full a ROM with no swelling, bruising or deformity.  No edema, pulses full. Extremities were warm and pink.  Capillary refill  was brisk.  Back: There is pain to palpation over the left buttock and straight leg raising was negative. Lasegue's sign and popliteal compression are negative. DTRs were normal for the knee reflexes been unobtainable for ankle reflexes. Muscle strength and sensation are normal. Skin:  Clear, warm and dry.  No rash. Neuro:  Alert and oriented times 3.  Muscle strength was normal.  Sensation was intact to light touch.   Radiology:  Dg Ankle Complete Left  11/01/2011  *RADIOLOGY REPORT*  Clinical Data: Left ankle pain laterally.  LEFT ANKLE COMPLETE - 3+ VIEW  Comparison: 04/07/2008 examination from [facility].  Findings: Small well corticated ossicles noted below the medial malleolus, better shown than on the prior exam.  No malleolar fracture noted.  Plafond and talar dome appear intact. Base the fifth metatarsal appears normal.  Achilles calcaneal spur noted.  IMPRESSION:  1.  Achilles calcaneal spur.   Otherwise, no significant abnormality identified.   Original Report Authenticated By: Dellia Cloud, M.D.    I reviewed the images independently and personally and concur with the radiologist's findings.  Course in Urgent Care Center:   She was given an ASO brace and crutches and told to avoid weight bearing and rest for the next 5 days. If no better I recommended she see Dr. Jerl Santos for followup. She was given Depo-Medrol 80 mg IM. She tolerated this well without any immediate side effects.  Assessment:  The  primary encounter diagnosis was Tendonitis. A diagnosis of Sciatica was also pertinent to this visit.  Plan:   1.  The following meds were prescribed:   New Prescriptions   MELOXICAM (MOBIC) 15 MG TABLET    Take 1 tablet (15 mg total) by mouth daily.   PREDNISONE 5 MG KIT    Take 1 kit (5 mg total) by mouth daily after breakfast. Prednisone 5 mg 6 day dosepack.  Take as directed.   TRAMADOL (ULTRAM) 50 MG TABLET    Take 2 tablets (100 mg total) by mouth every 8 (eight)  hours as needed for pain.   2.  The patient was instructed in symptomatic care, including rest and activity, elevation, application of ice and compression.  Appropriate handouts were given. 3.  The patient was told to return if becoming worse in any way, if no better in 3 or 4 days, and given some red flag symptoms that would indicate earlier return.   4.  The patient was told to follow up with Dr. Marcene Corning in 2 weeks if no better.   Reuben Likes, MD 11/01/11 2147

## 2011-11-01 NOTE — ED Notes (Signed)
C/o pain in hip for several weeks- states she has had this before.  States for several weeks she has had pain and swelling to lt ankle- states worse with weight bearing. Denies injury.

## 2012-03-17 ENCOUNTER — Emergency Department (INDEPENDENT_AMBULATORY_CARE_PROVIDER_SITE_OTHER)
Admission: EM | Admit: 2012-03-17 | Discharge: 2012-03-17 | Disposition: A | Discharge status: Home / Self Care | Payer: Self-pay | Source: Home / Self Care | Attending: Family Medicine | Admitting: Family Medicine

## 2012-03-17 ENCOUNTER — Encounter (HOSPITAL_COMMUNITY): Payer: Self-pay | Admitting: Emergency Medicine

## 2012-03-17 ENCOUNTER — Other Ambulatory Visit (HOSPITAL_COMMUNITY)
Admission: RE | Admit: 2012-03-17 | Discharge: 2012-03-17 | Disposition: A | Discharge status: Home / Self Care | Payer: Self-pay | Source: Ambulatory Visit | Attending: Family Medicine | Admitting: Family Medicine

## 2012-03-17 DIAGNOSIS — B3731 Acute candidiasis of vulva and vagina: Secondary | ICD-10-CM

## 2012-03-17 DIAGNOSIS — M549 Dorsalgia, unspecified: Secondary | ICD-10-CM

## 2012-03-17 DIAGNOSIS — N760 Acute vaginitis: Secondary | ICD-10-CM | POA: Insufficient documentation

## 2012-03-17 DIAGNOSIS — B373 Candidiasis of vulva and vagina: Secondary | ICD-10-CM

## 2012-03-17 LAB — POCT URINALYSIS DIP (DEVICE)
Nitrite: NEGATIVE
Protein, ur: 30 mg/dL — AB
Urobilinogen, UA: 0.2 mg/dL (ref 0.0–1.0)
pH: 5.5 (ref 5.0–8.0)

## 2012-03-17 MED ORDER — FLUCONAZOLE 150 MG PO TABS: 150.0000 mg | ORAL_TABLET | Freq: Once | ORAL | Status: DC

## 2012-03-17 MED ORDER — HYDROCODONE-ACETAMINOPHEN 5-325 MG PO TABS: 2.0000 | ORAL_TABLET | ORAL | Status: AC | PRN

## 2012-03-17 NOTE — ED Provider Notes (Signed)
History     CSN: [id number]  Arrival date & time 03/17/12  4098   First MD Initiated Contact with Patient 03/17/12 1834      Chief Complaint  Patient presents with  . Back Pain    (Consider location/radiation/quality/duration/timing/severity/associated sxs/prior treatment) Patient is a 47 y.o. female presenting with vaginal discharge.  Vaginal Discharge This is a new problem. The current episode started more than 1 week ago. The problem occurs constantly. The problem has been gradually worsening. Pertinent negatives include no abdominal pain. Nothing aggravates the symptoms. Nothing relieves the symptoms. Treatments tried: otc yeast medication. The treatment provided no relief.  Pt complains of low back pain.   Pt reports she has disc problems.  Past Medical History  Diagnosis Date  . Hypertension   . Diabetes mellitus   . Environmental allergies     Past Surgical History  Procedure Date  . Knee surgery   . Hand surgery   . Ankle surgery     No family history on file.  History  Substance Use Topics  . Smoking status: Current Every Day Smoker -- 1.0 packs/day  . Smokeless tobacco: Not on file  . Alcohol Use: No    OB History    Grav Para Term Preterm Abortions TAB SAB Ect Mult Living                  Review of Systems  Gastrointestinal: Negative for abdominal pain.  Genitourinary: Positive for vaginal discharge.  All other systems reviewed and are negative.    Allergies  Review of patient's allergies indicates no known allergies.  Home Medications   Current Outpatient Rx  Name  Route  Sig  Dispense  Refill  . GLIMEPIRIDE 2 MG PO TABS   Oral   Take 2 mg by mouth daily before breakfast.         . METFORMIN HCL PO   Oral   Take by mouth.         . ASPIRIN 81 MG PO TABS   Oral   Take 81 mg by mouth daily.         . OMEGA-3 FATTY ACIDS 1000 MG PO CAPS   Oral   Take 2 g by mouth daily.         . INSULIN GLARGINE 100 UNIT/ML [place] SOLN  Subcutaneous   Inject into the skin at bedtime.         Marland Kitchen LIRAGLUTIDE [place]   Subcutaneous   Inject into the skin.         Marland Kitchen MELOXICAM 15 MG PO TABS   Oral   Take 1 tablet (15 mg total) by mouth daily.   15 tablet   0   . PAROXETINE HCL 20 MG PO TABS   Oral   Take 20 mg by mouth every morning.         Marland Kitchen PREDNISONE 5 MG PO KIT   Oral   Take 1 kit (5 mg total) by mouth daily after breakfast. Prednisone 5 mg 6 day dosepack.  Take as directed.   1 kit   0   . UNKNOWN TO PATIENT      HTN med           BP 128/85  Pulse 92  Temp 97 F (36.1 C) (Oral)  Resp 16  SpO2 97%  LMP 03/07/2012  Physical Exam  Nursing note and vitals reviewed. Constitutional: She appears well-developed and well-nourished.  HENT:  Head: Normocephalic.  Right  Ear: External ear normal.  Mouth/Throat: Oropharynx is clear and moist.  Neck: Normal range of motion.  Cardiovascular: Normal rate and normal heart sounds.   Pulmonary/Chest: Effort normal and breath sounds normal.  Abdominal: Soft.  Genitourinary: Vaginal discharge found.       Thick white discharge,      ED Course  Procedures (including critical care time)  Labs Reviewed  POCT URINALYSIS DIP (DEVICE) - Abnormal; Notable for the following:    Glucose, UA 100 (*)     Bilirubin Urine SMALL (*)     Ketones, ur 15 (*)     Protein, ur 30 (*)     Leukocytes, UA TRACE (*)  Biochemical Testing Only. Please order routine urinalysis from main lab if confirmatory testing is needed.   All other components within normal limits   No results found.   1. Back pain   2. Yeast vaginitis       MDM  No std risk.  Wet prep sent.  Primary care referrals       Elson Areas, [place] 03/17/12 2029  Lonia Skinner [place], [place] 03/17/12 2029

## 2012-03-17 NOTE — ED Notes (Signed)
Pt c/o lower back x1 months Sx include: abd pain, foul urine odor, vag discharge, occasional discharge, vag irritation Denies: fevers, vomiting, nauseas, diarrhea Hx of pinched nerve on left side  She is alert w/no signs of acute distress.

## 2012-03-19 NOTE — ED Notes (Signed)
Affirm: Candida pos. Pt. adequately treated with Diflucan. Vassie Moselle 03/19/2012

## 2012-03-21 NOTE — ED Provider Notes (Signed)
Medical screening examination/treatment/procedure(s) were performed by resident physician or non-physician practitioner and as supervising physician I was immediately available for consultation/collaboration.   Barkley Bruns MD.    Linna Hoff, MD 03/21/12 1728

## 2012-08-03 ENCOUNTER — Encounter (HOSPITAL_COMMUNITY): Payer: Self-pay | Admitting: *Deleted

## 2012-08-03 ENCOUNTER — Emergency Department (INDEPENDENT_AMBULATORY_CARE_PROVIDER_SITE_OTHER)
Admission: EM | Admit: 2012-08-03 | Discharge: 2012-08-03 | Disposition: A | Discharge status: Home / Self Care | Payer: Self-pay | Source: Home / Self Care

## 2012-08-03 DIAGNOSIS — B029 Zoster without complications: Secondary | ICD-10-CM

## 2012-08-03 DIAGNOSIS — H6990 Unspecified Eustachian tube disorder, unspecified ear: Secondary | ICD-10-CM

## 2012-08-03 DIAGNOSIS — M25559 Pain in unspecified hip: Secondary | ICD-10-CM

## 2012-08-03 DIAGNOSIS — H9209 Otalgia, unspecified ear: Secondary | ICD-10-CM

## 2012-08-03 DIAGNOSIS — M25552 Pain in left hip: Secondary | ICD-10-CM

## 2012-08-03 DIAGNOSIS — H6980 Other specified disorders of Eustachian tube, unspecified ear: Secondary | ICD-10-CM

## 2012-08-03 DIAGNOSIS — H9202 Otalgia, left ear: Secondary | ICD-10-CM

## 2012-08-03 DIAGNOSIS — H6982 Other specified disorders of Eustachian tube, left ear: Secondary | ICD-10-CM

## 2012-08-03 DIAGNOSIS — G8929 Other chronic pain: Secondary | ICD-10-CM

## 2012-08-03 DIAGNOSIS — G500 Trigeminal neuralgia: Secondary | ICD-10-CM

## 2012-08-03 MED ORDER — METHYLPREDNISOLONE 4 MG PO KIT: PACK | ORAL | Status: DC

## 2012-08-03 MED ORDER — VALACYCLOVIR HCL 1 G PO TABS: 1000.0000 mg | ORAL_TABLET | Freq: Three times a day (TID) | ORAL | Status: AC

## 2012-08-03 MED ORDER — TRIAMCINOLONE ACETONIDE 40 MG/ML IJ SUSP
60.0000 mg | Freq: Once | INTRAMUSCULAR | Status: AC
  Administered 2012-08-03: 60 mg via INTRAMUSCULAR

## 2012-08-03 MED ORDER — GABAPENTIN 300 MG PO CAPS: ORAL_CAPSULE | ORAL | Status: DC

## 2012-08-03 MED ORDER — FEXOFENADINE HCL 180 MG PO TABS: 180.0000 mg | ORAL_TABLET | Freq: Every day | ORAL | Status: DC

## 2012-08-03 MED ORDER — HYDROCODONE-ACETAMINOPHEN 7.5-325 MG PO TABS: 1.0000 | ORAL_TABLET | ORAL | Status: DC | PRN

## 2012-08-03 MED ORDER — TRIAMCINOLONE ACETONIDE 40 MG/ML IJ SUSP
INTRAMUSCULAR | Status: AC
  Filled 2012-08-03: qty 5

## 2012-08-03 NOTE — ED Provider Notes (Signed)
Medical screening examination/treatment/procedure(s) were performed by non-physician practitioner and as supervising physician I was immediately available for consultation/collaboration.  Raynald Blend, MD 08/03/12 [phone]

## 2012-08-03 NOTE — ED Provider Notes (Signed)
History     CSN: [id number]  Arrival date & time 08/03/12  1109   First MD Initiated Contact with Patient 08/03/12 1244      Chief Complaint  Patient presents with  . Facial Pain    (Consider location/radiation/quality/duration/timing/severity/associated sxs/prior treatment) HPI Comments: 47 year old mildly obese female complains of pain in the right face intermittently for 10 years. She relates an extensive workup including MRI and nerve conduction studies. She states no one has diagnosed her pain. The pain is sharp but constant. Sometimes it is worse than other times. She rates the pain as a 9-10 out of 10. The pain follow saline from the preauricular area to the corner of the right mouth. Last p.m. she noticed an irritating rash to the posterior right neck. Her husband noticed for small red papules. She states these are tender and painful. Is also complaining of a sensation of water and discomfort of the left ear. She has symptoms of allergies with PND, cough and nasal congestion. She has a long history of chronic intermittent left hip pain which is also been evaluated in the past. 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   Past Medical History  Diagnosis Date  . Hypertension   . Diabetes mellitus   . Environmental allergies     Past Surgical History  Procedure Laterality Date  . Knee surgery    . Hand surgery    . Ankle surgery      History reviewed. No pertinent family history.  History  Substance Use Topics  . Smoking status: Current Every Day Smoker -- 1.00 packs/day  . Smokeless tobacco: Not on file  . Alcohol Use: No    OB History   Grav Para Term Preterm Abortions TAB SAB Ect Mult Living                  Review of Systems  Constitutional: Positive for activity change and fatigue.  HENT: Positive for ear pain, congestion, rhinorrhea, neck pain and postnasal drip. Negative for sore throat, facial swelling, dental problem, sinus pressure, tinnitus and ear discharge.   Eyes:  Negative.   Respiratory: Negative.   Cardiovascular: Negative.   Gastrointestinal: Negative.   Musculoskeletal: Positive for arthralgias.  Neurological: Negative for facial asymmetry and speech difficulty.    Allergies  Review of patient's allergies indicates no known allergies.  Home Medications   Current Outpatient Rx  Name  Route  Sig  Dispense  Refill  . aspirin 81 MG tablet   Oral   Take 81 mg by mouth daily.         . fish oil-omega-3 fatty acids 1000 MG capsule   Oral   Take 2 g by mouth daily.         . fluconazole (DIFLUCAN) 150 MG tablet   Oral   Take 1 tablet (150 mg total) by mouth once.   1 tablet   1   . gabapentin (NEURONTIN) 300 MG capsule      1 tab po at bedtime 1st day, 1 tablet bid second day, then 1 tablet tid   30 capsule   0   . glimepiride (AMARYL) 2 MG tablet   Oral   Take 2 mg by mouth daily before breakfast.         . HYDROcodone-acetaminophen (NORCO) 7.5-325 MG per tablet   Oral   Take 1 tablet by mouth every 4 (four) hours as needed for pain.   15 tablet   0   . insulin glargine (  LANTUS) 100 UNIT/ML injection   Subcutaneous   Inject into the skin at bedtime.         Marland Kitchen LIRAGLUTIDE Surprise   Subcutaneous   Inject into the skin.         . meloxicam (MOBIC) 15 MG tablet   Oral   Take 1 tablet (15 mg total) by mouth daily.   15 tablet   0   . METFORMIN HCL PO   Oral   Take by mouth.         . methylPREDNISolone (MEDROL DOSEPAK) 4 MG tablet      follow package directions   21 tablet   0   . PARoxetine (PAXIL) 20 MG tablet   Oral   Take 20 mg by mouth every morning.         . PredniSONE 5 MG KIT   Oral   Take 1 kit (5 mg total) by mouth daily after breakfast. Prednisone 5 mg 6 day dosepack.  Take as directed.   1 kit   0   . UNKNOWN TO PATIENT      HTN med         . valACYclovir (VALTREX) 1000 MG tablet   Oral   Take 1 tablet (1,000 mg total) by mouth 3 (three) times daily.   21 tablet   0      BP 148/79  Pulse 105  Temp(Src) 98.3 F (36.8 C) (Oral)  Resp 18  SpO2 99%  LMP 08/01/2012  Physical Exam  Nursing note and vitals reviewed. Constitutional: She is oriented to person, place, and time. She appears well-developed and well-nourished.  HENT:  Right TM is normal. Left TM some distortion. No erythema. There is a blue discoloration resembling a tympanostomy tube adjacent to the TM. No signs of infection or fluid in the ear canal or the TM. Oropharynx with cobblestoning and clear PND. Mild erythema. No swelling or inflammation of the time or other intraoral structures. No dental tenderness. Marked, severe tenderness to the right face. His pain includes the skin over the mandible. No lesions observed on the face. No discoloration or rash.  Eyes: Conjunctivae and EOM are normal.  Neck: Normal range of motion.  Cardiovascular: Normal rate, regular rhythm and normal heart sounds.   Pulmonary/Chest: Effort normal and breath sounds normal.  Musculoskeletal: She exhibits no edema.  Lymphadenopathy:    She has cervical adenopathy.  Neurological: She is alert and oriented to person, place, and time.  Skin: Skin is warm and dry.  4 red papules along the right side of the neck in a linear fashion just below the hairline.    ED Course  Procedures (including critical care time)  Labs Reviewed - No data to display No results found.   1. Trigeminal neuralgia pain   2. Herpes zoster   3. Chronic left hip pain       MDM  Regarding need for red papules on the right neck placed a linear fashion differential diagnosis would include a bug bite versus zoster rash. The type of pain that the patient is describing across the face along with the pain and tenderness is consistent with a type of neuralgia. Associating this pain with the 4 papules of erythema suggests that herpes zoster maybe in etiology. She is taking gabapentin in the past and this seemed to work but her prescription  ran out. Allegra 190 mg daily when necessary allergies Recommend Sudafed PE 10 mg every 4-6 hours when necessary congestion Kenalog  40 mg IM Medrol Dosepak start tomorrow Valacyclovir 1 g 3 times a day for 7 days Gabapentin 300 mg capsules escalating dose to 3 times a day. Norco 7.5 every 4 hours when necessary pain #15 Followup with PCP.        Hayden Rasmussen, NP 08/03/12 1319

## 2012-08-03 NOTE — ED Notes (Signed)
Pt  Reports  Pain  r  Side  Face      Since  Last  Pm  Radiating  Towards  r  Ear  -  She  Also  Reports   Pain l       Hip  Chronically  Pt  Also  Reports     Rash on  Back  Of  Neck        Poss  Bug  Bites  - she  Reports  The  Rash itches

## 2012-08-12 ENCOUNTER — Encounter: Payer: Self-pay | Admitting: Family Medicine

## 2012-08-12 ENCOUNTER — Ambulatory Visit: Payer: Self-pay | Attending: Family Medicine | Admitting: Family Medicine

## 2012-08-12 VITALS — BP 130/88 | HR 93 | Temp 98.4°F | Resp 18 | Wt 209.0 lb

## 2012-08-12 DIAGNOSIS — IMO0001 Reserved for inherently not codable concepts without codable children: Secondary | ICD-10-CM

## 2012-08-12 DIAGNOSIS — G500 Trigeminal neuralgia: Secondary | ICD-10-CM | POA: Insufficient documentation

## 2012-08-12 DIAGNOSIS — B029 Zoster without complications: Secondary | ICD-10-CM | POA: Insufficient documentation

## 2012-08-12 LAB — COMPREHENSIVE METABOLIC PANEL
ALT: 14 U/L (ref 0–35)
AST: 9 U/L (ref 0–37)
Alkaline Phosphatase: 91 U/L (ref 39–117)
Creat: 0.65 mg/dL (ref 0.50–1.10)
Total Bilirubin: 0.4 mg/dL (ref 0.3–1.2)

## 2012-08-12 LAB — LIPID PANEL
HDL: 38 mg/dL — ABNORMAL LOW (ref >39)
Total CHOL/HDL Ratio: 5.4 Ratio
VLDL: 34 mg/dL (ref 0–40)

## 2012-08-12 LAB — POCT GLYCOSYLATED HEMOGLOBIN (HGB A1C): Hemoglobin A1C: 9

## 2012-08-12 LAB — TSH: TSH: 1.368 u[IU]/mL (ref 0.350–4.500)

## 2012-08-12 MED ORDER — LISINOPRIL 5 MG PO TABS: 5.0000 mg | ORAL_TABLET | Freq: Every day | ORAL | Status: DC

## 2012-08-12 MED ORDER — KETOCONAZOLE 2 % EX CREA: TOPICAL_CREAM | Freq: Every day | CUTANEOUS | Status: DC

## 2012-08-12 MED ORDER — GABAPENTIN 400 MG PO CAPS: 400.0000 mg | ORAL_CAPSULE | Freq: Three times a day (TID) | ORAL | Status: DC

## 2012-08-12 MED ORDER — METFORMIN HCL ER 500 MG PO TB24: 1000.0000 mg | ORAL_TABLET | Freq: Two times a day (BID) | ORAL | Status: DC

## 2012-08-12 NOTE — Progress Notes (Signed)
Patient complains of right sided facial pain and rash on left and calf. Was diagnosed with shingles. States chronic pain issues.

## 2012-08-12 NOTE — Patient Instructions (Addendum)
Blood Sugar Monitoring, Adult  GLUCOSE METERS FOR SELF-MONITORING OF BLOOD GLUCOSE   It is important to be able to correctly measure your blood sugar (glucose). You can use a blood glucose monitor (a small battery-operated device) to check your glucose level at any time. This allows you and your caregiver to monitor your diabetes and to determine how well your treatment plan is working. The process of monitoring your blood glucose with a glucose meter is called self-monitoring of blood glucose (SMBG). When people with diabetes control their blood sugar, they have better health.  To test for glucose with a typical glucose meter, place the disposable strip in the meter. Then place a small sample of blood on the "test strip." The test strip is coated with chemicals that combine with glucose in blood. The meter measures how much glucose is present. The meter displays the glucose level as a number. Several new models can record and store a number of test results. Some models can connect to personal computers to store test results or print them out.   Newer meters are often easier to use than older models. Some meters allow you to get blood from places other than your fingertip. Some new models have automatic timing, error codes, signals, or barcode readers to help with proper adjustment (calibration). Some meters have a large display screen or spoken instructions for people with visual impairments.   INSTRUCTIONS FOR USING GLUCOSE METERS   Wash your hands with soap and warm water, or clean the area with alcohol. Dry your hands completely.   Prick the side of your fingertip with a lancet (a sharp-pointed tool used by hand).   Hold the hand down and gently milk the finger until a small drop of blood appears. Catch the blood with the test strip.    Follow the instructions for inserting the test strip and using the SMBG meter. Most meters require the meter to be turned on and the test strip to be inserted before applying the blood sample.   Record the test result.   Read the instructions carefully for both the meter and the test strips that go with it. Meter instructions are found in the user manual. Keep this manual to help you solve any problems that may arise. Many meters use "error codes" when there is a problem with the meter, the test strip, or the blood sample on the strip. You will need the manual to understand these error codes and fix the problem.   New devices are available such as laser lancets and meters that can test blood taken from "alternative sites" of the body, other than fingertips. However, you should use standard fingertip testing if your glucose changes rapidly. Also, use standard testing if:   You have eaten, exercised, or taken insulin in the past 2 hours.   You think your glucose is low.   You tend to not feel symptoms of low blood glucose (hypoglycemia).   You are ill or under stress.   Clean the meter as directed by the manufacturer.   Test the meter for accuracy as directed by the manufacturer.   Take your meter with you to your caregiver's office. This way, you can test your glucose in front of your caregiver to make sure you are using the meter correctly. Your caregiver can also take a sample of blood to test using a routine lab method. If values on the glucose meter are close to the lab results, you and your caregiver will see   that your meter is working well and you are using good technique. Your caregiver will advise you about what to do if the results do not match.  FREQUENCY OF TESTING    Your caregiver will tell you how often you should check your blood glucose. This will depend on your type of diabetes, your current level of diabetes control, and your types of medicines. The following are general guidelines, but your care plan may be different. Record all your readings and the time of day you took them for review with your caregiver.    Diabetes type 1.   When you are using insulin with good diabetic control (either multiple daily injections or via a pump), you should check your glucose 4 times a day.   If your diabetes is not well controlled, you may need to monitor more frequently, including before meals and 2 hours after meals, at bedtime, and occasionally between 2 a.m. and 3 a.m.   You should always check your glucose before a dose of insulin or before changing the rate on your insulin pump.   Diabetes type 2.   Guidelines for SMBG in diabetes type 2 are not as well defined.   If you are on insulin, follow the guidelines above.   If you are on medicines, but not insulin, and your glucose is not well controlled, you should test at least twice daily.   If you are not on insulin, and your diabetes is controlled with medicines or diet alone, you should test at least once daily, usually before breakfast.   A weekly profile will help your caregiver advise you on your care plan. The week before your visit, check your glucose before a meal and 2 hours after a meal at least daily. You may want to test before and after a different meal each day so you and your caregiver can tell how well controlled your blood sugars are throughout the course of a 24 hour period.   Gestational diabetes (diabetes during pregnancy).   Frequent testing is often necessary. Accurate timing is important.   If you are not on insulin, check your glucose 4 times a day. Check it before breakfast and 1 hour after the start of each meal.    If you are on insulin, check your glucose 6 times a day. Check it before each meal and 1 hour after the first bite of each meal.   General guidelines.   More frequent testing is required at the start of insulin treatment. Your caregiver will instruct you.   Test your glucose any time you suspect you have low blood sugar (hypoglycemia).   You should test more often when you change medicines, when you have unusual stress or illness, or in other unusual circumstances.  OTHER THINGS TO KNOW ABOUT GLUCOSE METERS   Measurement Range. Most glucose meters are able to read glucose levels over a broad range of values from as low as 0 to as high as 600 mg/dL. If you get an extremely high or low reading from your meter, you should first confirm it with another reading. Report very high or very low readings to your caregiver.   Whole Blood Glucose versus Plasma Glucose. Some older home glucose meters measure glucose in your whole blood. In a lab or when using some newer home glucose meters, the glucose is measured in your plasma (one component of blood). The difference can be important. It is important for you and your caregiver to know whether your meter   gives its results as "whole blood equivalent" or "plasma equivalent."   Display of High and Low Glucose Values. Part of learning how to operate a meter is understanding what the meter results mean. Know how high and low glucose concentrations are displayed on your meter.   Factors that Affect Glucose Meter Performance. The accuracy of your test results depends on many factors and varies depending on the brand and type of meter. These factors include:   Low red blood cell count (anemia).   Substances in your blood (such as uric acid, vitamin C, and others).   Environmental factors (temperature, humidity, altitude).   Name-brand versus generic test strips.    Calibration. Make sure your meter is set up properly. It is a good idea to do a calibration test with a control solution recommended by the manufacturer of your meter whenever you begin using a fresh bottle of test strips. This will help verify the accuracy of your meter.   Improperly stored, expired, or defective test strips. Keep your strips in a dry place with the lid on.   Soiled meter.   Inadequate blood sample.  NEW TECHNOLOGIES FOR GLUCOSE TESTING  Alternative site testing  Some glucose meters allow testing blood from alternative sites. These include the:   Upper arm.   Forearm.   Base of the thumb.   Thigh.  Sampling blood from alternative sites may be desirable. However, it may have some limitations. Blood in the fingertips show changes in glucose levels more quickly than blood in other parts of the body. This means that alternative site test results may be different from fingertip test results, not because of the meter's ability to test accurately, but because the actual glucose concentration can be different.   Continuous Glucose Monitoring  Devices to measure your blood glucose continuously are available, and others are in development. These methods can be more expensive than self-monitoring with a glucose meter. However, it is uncertain how effective and reliable these devices are. Your caregiver will advise you if this approach makes sense for you.  IF BLOOD SUGARS ARE CONTROLLED, PEOPLE WITH DIABETES REMAIN HEALTHIER.   SMBG is an important part of the treatment plan of patients with diabetes mellitus. Below are reasons for using SMBG:    It confirms that your glucose is at a specific, healthy level.   It detects hypoglycemia and severe hyperglycemia.   It allows you and your caregiver to make adjustments in response to changes in lifestyle for individuals requiring medicine.    It determines the need for starting insulin therapy in temporary diabetes that happens during pregnancy (gestational diabetes).  Document Released: 02/21/2003 Document Revised: 05/13/2011 Document Reviewed: 06/14/2010  [employer] Patient Information 2014 [employer].

## 2012-08-12 NOTE — Progress Notes (Signed)
Patient ID: Joy Solis, female   DOB: 1966/01/14, 47 y.o.   MRN: [id number]  CC: establish  HPI: Pt has trigeminal neuralgia and recently diagnosed with shingles.  Pt has not been seen by PCP in 2 years.  Pt has type 2 diabetes and hyperlipidemia and reports that she has a lesion on left leg that is itching.   No Known Allergies Past Medical History  Diagnosis Date  . Hypertension   . Diabetes mellitus   . Environmental allergies    Current Outpatient Prescriptions on File Prior to Visit  Medication Sig Dispense Refill  . fexofenadine (ALLEGRA) 180 MG tablet Take 1 tablet (180 mg total) by mouth daily.  20 tablet  0  . HYDROcodone-acetaminophen (NORCO) 7.5-325 MG per tablet Take 1 tablet by mouth every 4 (four) hours as needed for pain.  15 tablet  0  . insulin glargine (LANTUS) 100 UNIT/ML injection Inject into the skin at bedtime.      . meloxicam (MOBIC) 15 MG tablet Take 1 tablet (15 mg total) by mouth daily.  15 tablet  0  . valACYclovir (VALTREX) 1000 MG tablet Take 1 tablet (1,000 mg total) by mouth 3 (three) times daily.  21 tablet  0  . aspirin 81 MG tablet Take 81 mg by mouth daily.      . fish oil-omega-3 fatty acids 1000 MG capsule Take 2 g by mouth daily.      Marland Kitchen PARoxetine (PAXIL) 20 MG tablet Take 20 mg by mouth every morning.      . PredniSONE 5 MG KIT Take 1 kit (5 mg total) by mouth daily after breakfast. Prednisone 5 mg 6 day dosepack.  Take as directed.  1 kit  0   No current facility-administered medications on file prior to visit.   No family history on file. History   Social History  . Marital Status: Married    Spouse Name: N/A    Number of Children: N/A  . Years of Education: N/A   Occupational History  . Not on file.   Social History Main Topics  . Smoking status: Current Every Day Smoker -- 1.00 packs/day  . Smokeless tobacco: Not on file  . Alcohol Use: No  . Drug Use: No  . Sexually Active:    Other Topics Concern  . Not on file   Social  History Narrative  . No narrative on file    Review of Systems  Constitutional: Negative for fever, chills, diaphoresis, activity change, appetite change and fatigue.  HENT: Negative for ear pain, nosebleeds, congestion, facial swelling, rhinorrhea, neck pain, neck stiffness and ear discharge.   Eyes: Negative for pain, discharge, redness, itching and visual disturbance.  Respiratory: Negative for cough, choking, chest tightness, shortness of breath, wheezing and stridor.   Cardiovascular: Negative for chest pain, palpitations and leg swelling.  Gastrointestinal: Negative for abdominal distention.  Genitourinary: Negative for dysuria, urgency, frequency, hematuria, flank pain, decreased urine volume, difficulty urinating and dyspareunia.  Musculoskeletal: Negative for back pain, joint swelling, arthralgias and gait problem.  Neurological: Negative for dizziness, tremors, seizures, syncope, facial asymmetry, speech difficulty, weakness, light-headedness, numbness and headaches.  Hematological: Negative for adenopathy. Does not bruise/bleed easily.  Psychiatric/Behavioral: Negative for hallucinations, behavioral problems, confusion, dysphoric mood, decreased concentration and agitation.    Objective:   Filed Vitals:   08/12/12 1327  BP: 130/88  Pulse: 93  Temp: 98.4 F (36.9 C)  Resp: 18    Physical Exam  Constitutional: Appears well-developed and well-nourished.  No distress.  HENT: Normocephalic. External right and left ear normal. Oropharynx is clear and moist.  Eyes: Conjunctivae and EOM are normal. PERRLA, no scleral icterus.  Neck: Normal ROM. Neck supple. No JVD. No tracheal deviation. No thyromegaly.  CVS: RRR, S1/S2 +, no murmurs, no gallops, no carotid bruit.  Pulmonary: Effort and breath sounds normal, no stridor, rhonchi, wheezes, rales.  Abdominal: Soft. BS +,  no distension, tenderness, rebound or guarding.  Musculoskeletal: fungal like growth on left leg round and  excoriated like ringworm, Normal range of motion. No edema and no tenderness.  Lymphadenopathy: No lymphadenopathy noted, cervical, inguinal. Neuro: Alert. Normal reflexes, muscle tone coordination. No cranial nerve deficit. Skin: Skin is warm and dry. Not diaphoretic. No erythema. No pallor.  Psychiatric: Normal mood and affect. Behavior, judgment, thought content normal.   Lab Results  Component Value Date   WBC 12.4* 03/18/2009   HGB 13.8 03/18/2009   HCT 40.8 03/18/2009   MCV 88.2 03/18/2009   PLT 302 03/18/2009   Lab Results  Component Value Date   CREATININE 0.62 03/18/2009   BUN 10 03/18/2009   NA 132* 03/18/2009   K 3.8 03/18/2009   CL 99 03/18/2009   CO2 24 03/18/2009    Lab Results  Component Value Date   HGBA1C  Value: 8.6 (NOTE) The ADA recommends the following therapeutic goal for glycemic control related to Hgb A1c measurement: Goal of therapy: <6.5 Hgb A1c  Reference: American Diabetes Association: Clinical Practice Recommendations 2010, Diabetes Care, 2010, 33: (Suppl  1).* 03/01/2009   Lipid Panel     Component Value Date/Time   CHOL  Value: 148        ATP III CLASSIFICATION:  <200     mg/dL   Desirable  962-952  mg/dL   Borderline High  >=841    mg/dL   High        32/44/0102 1053   TRIG 102 03/01/2009 1053   HDL 35* 03/01/2009 1053   CHOLHDL 4.2 03/01/2009 1053   VLDL 20 03/01/2009 1053   LDLCALC  Value: 93        Total Cholesterol/HDL:CHD Risk Coronary Heart Disease Risk Table                     Men   Women  1/2 Average Risk   3.4   3.3  Average Risk       5.0   4.4  2 X Average Risk   9.6   7.1  3 X Average Risk  23.4   11.0        Use the calculated Patient Ratio above and the CHD Risk Table to determine the patient's CHD Risk.        ATP III CLASSIFICATION (LDL):  <100     mg/dL   Optimal  725-366  mg/dL   Near or Above                    Optimal  130-159  mg/dL   Borderline  440-347  mg/dL   High  >425     mg/dL   Very High 95/63/8756 1053       Assessment and  plan:   Patient Active Problem List   Diagnosis Date Noted  . Trigeminal neuralgia 08/12/2012  . Trigeminal neuralgia pain 08/12/2012  . Shingles 08/12/2012  . Type II or unspecified type diabetes mellitus without mention of complication, uncontrolled 08/12/2012   Gabapentin 400 mg po TID  prn Continue metformin but increase to 1000 mg po BID with meals Continue basal lantus 20 units daily at HS Monitor BS 3 to 4 times per day Check labs today and follow Send for old records Start lisinopril 5 mg po daily The patient was counseled on the dangers of tobacco use, and was advised to quit.  Reviewed strategies to maximize success, including removing cigarettes and smoking materials from environment and stress management.  RTC in 2 weeks   The patient was given clear instructions to go to ER or return to medical center if symptoms don't improve, worsen or new problems develop.  The patient verbalized understanding.  The patient was told to call to get lab results if they haven't heard anything in the next week.    Rodney Langton, MD, CDE, FAAFP [facility] [place], [place]

## 2012-08-13 ENCOUNTER — Encounter: Payer: Self-pay | Admitting: Family Medicine

## 2012-08-13 ENCOUNTER — Telehealth: Payer: Self-pay | Admitting: *Deleted

## 2012-08-13 DIAGNOSIS — E785 Hyperlipidemia, unspecified: Secondary | ICD-10-CM | POA: Insufficient documentation

## 2012-08-13 LAB — VITAMIN D 25 HYDROXY (VIT D DEFICIENCY, FRACTURES): Vit D, 25-Hydroxy: 38 ng/mL (ref 30–89)

## 2012-08-13 NOTE — Telephone Encounter (Signed)
08/13/12 Patient made aware of lab came back okay except cholesterol level elevated . Will need to start  Medication for this  Patient stated to call in prescription at [facility]  appt schedule for 11/13/12 for recheck. P.Jorryn Hershberger,RN BSN [facility]

## 2012-08-13 NOTE — Progress Notes (Signed)
Quick Note:  Please inform patient that labs came back okay except that her cholesterol was elevated. She needs to be on a statin medication to lower her cholesterol levels because of her diabetes mellitus. Please call and a prescription for pravastatin 40 mg take 1 by mouth daily in the evening, #30, refill x3. We should recheck her cholesterol levels again in 3 months.   Rodney Langton, MD, CDE, FAAFP [facility] [place], [place]   ______

## 2012-08-26 ENCOUNTER — Ambulatory Visit: Payer: Self-pay | Attending: Family Medicine | Admitting: Family Medicine

## 2012-08-26 ENCOUNTER — Telehealth: Payer: Self-pay | Admitting: *Deleted

## 2012-08-26 ENCOUNTER — Encounter: Payer: Self-pay | Admitting: Family Medicine

## 2012-08-26 VITALS — BP 135/83 | HR 84 | Temp 98.4°F | Resp 16 | Wt 212.0 lb

## 2012-08-26 DIAGNOSIS — E119 Type 2 diabetes mellitus without complications: Secondary | ICD-10-CM | POA: Insufficient documentation

## 2012-08-26 MED ORDER — LISINOPRIL 5 MG PO TABS: 5.0000 mg | ORAL_TABLET | Freq: Every day | ORAL | Status: DC

## 2012-08-26 MED ORDER — INSULIN NPH (HUMAN) (ISOPHANE) 100 UNIT/ML ~~LOC~~ SUSP: 15.0000 [IU] | Freq: Every day | SUBCUTANEOUS | Status: DC

## 2012-08-26 MED ORDER — "INSULIN SYRINGE-NEEDLE U-100 29G X 1/2"" 0.3 ML MISC": 1.0000 | Status: DC

## 2012-08-26 MED ORDER — INSULIN ASPART 100 UNIT/ML ~~LOC~~ SOLN
8.0000 [IU] | Freq: Once | SUBCUTANEOUS | Status: AC
  Administered 2012-08-26: 8 [IU] via SUBCUTANEOUS

## 2012-08-26 MED ORDER — METFORMIN HCL ER 500 MG PO TB24: 1000.0000 mg | ORAL_TABLET | Freq: Two times a day (BID) | ORAL | Status: DC

## 2012-08-26 NOTE — Progress Notes (Signed)
Patient ID: Joy Solis, female   DOB: 29-Dec-1965, 47 y.o.   MRN: [id number]  CC: follow up DM  HPI: Pt says that she has not been able to take her insulin because of not being able to afford it.  She is drinking regular sodas.  Pt having itchy eyes and sneezing.  She is not testing her blood glucose.  She has been taking her bp meds and her cholesterol medications.  Patient reports that she is been able to get her Lantus still because the cost of the medication has been too high.  She reports that she has been having blood sugars that have been in the high 200-300 range.  She reports that she has been drinking regular sodas regularly.  The patient also reports that she has been tired and having frequent urination.  She also reports that she has been urinating in the middle of the night.  No Known Allergies Past Medical History  Diagnosis Date  . Hypertension   . Diabetes mellitus   . Environmental allergies    Current Outpatient Prescriptions on File Prior to Visit  Medication Sig Dispense Refill  . aspirin 81 MG tablet Take 81 mg by mouth daily.      . fexofenadine (ALLEGRA) 180 MG tablet Take 1 tablet (180 mg total) by mouth daily.  20 tablet  0  . fish oil-omega-3 fatty acids 1000 MG capsule Take 2 g by mouth daily.      Marland Kitchen gabapentin (NEURONTIN) 400 MG capsule Take 1 capsule (400 mg total) by mouth 3 (three) times daily. 1 tab po at bedtime 1st day, 1 tablet bid second day, then 1 tablet tid  90 capsule  3  . HYDROcodone-acetaminophen (NORCO) 7.5-325 MG per tablet Take 1 tablet by mouth every 4 (four) hours as needed for pain.  15 tablet  0  . insulin glargine (LANTUS) 100 UNIT/ML injection Inject into the skin at bedtime.      Marland Kitchen ketoconazole (NIZORAL) 2 % cream Apply topically daily.  15 g  1  . lisinopril (PRINIVIL,ZESTRIL) 5 MG tablet Take 1 tablet (5 mg total) by mouth daily.  30 tablet  1  . meloxicam (MOBIC) 15 MG tablet Take 1 tablet (15 mg total) by mouth daily.  15 tablet  0  .  metFORMIN (GLUCOPHAGE XR) 500 MG 24 hr tablet Take 2 tablets (1,000 mg total) by mouth 2 (two) times daily with a meal.  120 tablet  3  . PARoxetine (PAXIL) 20 MG tablet Take 20 mg by mouth every morning.      . PredniSONE 5 MG KIT Take 1 kit (5 mg total) by mouth daily after breakfast. Prednisone 5 mg 6 day dosepack.  Take as directed.  1 kit  0   No current facility-administered medications on file prior to visit.   No family history on file. History   Social History  . Marital Status: Married    Spouse Name: N/A    Number of Children: N/A  . Years of Education: N/A   Occupational History  . Not on file.   Social History Main Topics  . Smoking status: Current Every Day Smoker -- 1.00 packs/day  . Smokeless tobacco: Not on file  . Alcohol Use: No  . Drug Use: No  . Sexually Active:    Other Topics Concern  . Not on file   Social History Narrative  . No narrative on file    Review of Systems  Constitutional: Negative for fever,  chills, diaphoresis, activity change, appetite change and fatigue.  HENT: Negative for ear pain, nosebleeds, congestion, facial swelling, rhinorrhea, neck pain, neck stiffness and ear discharge.   Eyes: Negative for pain, discharge, redness, itching and visual disturbance.  Respiratory: Negative for cough, choking, chest tightness, shortness of breath, wheezing and stridor.   Cardiovascular: Negative for chest pain, palpitations and leg swelling.  Gastrointestinal: Negative for abdominal distention.  Genitourinary: Negative for dysuria, urgency, frequency, hematuria, flank pain, decreased urine volume, difficulty urinating and dyspareunia.  Musculoskeletal: Negative for back pain, joint swelling, arthralgias and gait problem.  Neurological: Negative for dizziness, tremors, seizures, syncope, facial asymmetry, speech difficulty, weakness, light-headedness, numbness and headaches.  Hematological: Negative for adenopathy. Does not bruise/bleed easily.   Psychiatric/Behavioral: Negative for hallucinations, behavioral problems, confusion, dysphoric mood, decreased concentration and agitation.    Objective:   Filed Vitals:   08/26/12 0911  BP: 135/83  Pulse: 84  Temp: 98.4 F (36.9 C)  Resp: 16    Physical Exam  Constitutional: Appears well-developed and well-nourished. No distress.  HENT: Normocephalic. External right and left ear normal. Oropharynx is clear and moist.  Eyes: Conjunctivae and EOM are normal. PERRLA, no scleral icterus.  Neck: Normal ROM. Neck supple. No JVD. No tracheal deviation. No thyromegaly.  CVS: RRR, S1/S2 +, no murmurs, no gallops, no carotid bruit.  Pulmonary: Effort and breath sounds normal, no stridor, rhonchi, wheezes, rales.  Abdominal: Soft. BS +,  no distension, tenderness, rebound or guarding.  Musculoskeletal: Normal range of motion. No edema and no tenderness.  Lymphadenopathy: No lymphadenopathy noted, cervical, inguinal. Neuro: Alert. Normal reflexes, muscle tone coordination. No cranial nerve deficit. Skin: Skin is warm and dry. No rash noted. Not diaphoretic. No erythema. No pallor.  Psychiatric: Normal mood and affect. Behavior, judgment, thought content normal.   Lab Results  Component Value Date   WBC 12.4* 03/18/2009   HGB 13.8 03/18/2009   HCT 40.8 03/18/2009   MCV 88.2 03/18/2009   PLT 302 03/18/2009   Lab Results  Component Value Date   CREATININE 0.65 08/12/2012   BUN 13 08/12/2012   NA 138 08/12/2012   K 4.3 08/12/2012   CL 102 08/12/2012   CO2 24 08/12/2012    Lab Results  Component Value Date   HGBA1C 9.0% 08/12/2012   Lipid Panel     Component Value Date/Time   CHOL 205* 08/12/2012 1353   TRIG 168* 08/12/2012 1353   HDL 38* 08/12/2012 1353   CHOLHDL 5.4 08/12/2012 1353   VLDL 34 08/12/2012 1353   LDLCALC 133* 08/12/2012 1353       Assessment and plan:   Patient Active Problem List   Diagnosis Date Noted  . Dyslipidemia, goal LDL below 100 08/13/2012  . Trigeminal  neuralgia 08/12/2012  . Trigeminal neuralgia pain 08/12/2012  . Shingles 08/12/2012  . Type II or unspecified type diabetes mellitus without mention of complication, uncontrolled 08/12/2012   DC lantus because of cost. And switch to NPH.  Take 15 units QHS or as directed Monitor BS closely.  Hypoglycemia precautions discussed with the patient today. Check labs today.  Follow lab results.  Follow up in 3 months  The patient was given clear instructions to go to ER or return to medical center if symptoms don't improve, worsen or new problems develop.  The patient verbalized understanding.  The patient was told to call to get any lab results if not heard anything in the next week.    Rodney Langton, MD, CDE,  FAAFP [facility] [place], [place]

## 2012-08-26 NOTE — Patient Instructions (Addendum)
Try Visine Allergy and Claritin over the counter for allergy eyes and allergy symptoms   Allergic Rhinitis Allergic rhinitis is when the mucous membranes in the nose respond to allergens. Allergens are particles in the air that cause your body to have an allergic reaction. This causes you to release allergic antibodies. Through a chain of events, these eventually cause you to release histamine into the blood stream (hence the use of antihistamines). Although meant to be protective to the body, it is this release that causes your discomfort, such as frequent sneezing, congestion and an itchy runny nose.  CAUSES  The pollen allergens may come from grasses, trees, and weeds. This is seasonal allergic rhinitis, or "hay fever." Other allergens cause year-round allergic rhinitis (perennial allergic rhinitis) such as house dust mite allergen, pet dander and mold spores.  SYMPTOMS   Nasal stuffiness (congestion).  Runny, itchy nose with sneezing and tearing of the eyes.  There is often an itching of the mouth, eyes and ears. It cannot be cured, but it can be controlled with medications. DIAGNOSIS  If you are unable to determine the offending allergen, skin or blood testing may find it. TREATMENT   Avoid the allergen.  Medications and allergy shots (immunotherapy) can help.  Hay fever may often be treated with antihistamines in pill or nasal spray forms. Antihistamines block the effects of histamine. There are over-the-counter medicines that may help with nasal congestion and swelling around the eyes. Check with your caregiver before taking or giving this medicine. If the treatment above does not work, there are many new medications your caregiver can prescribe. Stronger medications may be used if initial measures are ineffective. Desensitizing injections can be used if medications and avoidance fails. Desensitization is when a patient is given ongoing shots until the body becomes less sensitive to  the allergen. Make sure you follow up with your caregiver if problems continue. SEEK MEDICAL CARE IF:   You develop fever (more than 100.5 F (38.1 C).  You develop a cough that does not stop easily (persistent).  You have shortness of breath.  You start wheezing.  Symptoms interfere with normal daily activities. Document Released: 11/13/2000 Document Revised: 05/13/2011 Document Reviewed: 05/25/2008 [facility] Patient Information 2014 [place], [place].   Hypoglycemia (Low Blood Sugar) Hypoglycemia is when the glucose (sugar) in your blood is too low. Hypoglycemia can happen for many reasons. It can happen to people with or without diabetes. Hypoglycemia can develop quickly and can be a medical emergency.  CAUSES  Having hypoglycemia does not mean that you will develop diabetes. Different causes include:  Missed or delayed meals or not enough carbohydrates eaten.  Medication overdose. This could be by accident or deliberate. If by accident, your medication may need to be adjusted or changed.  Exercise or increased activity without adjustments in carbohydrates or medications.  A nerve disorder that affects body functions like your heart rate, blood pressure and digestion (autonomic neuropathy).  A condition where the stomach muscles do not function properly (gastroparesis). Therefore, medications may not absorb properly.  The inability to recognize the signs of hypoglycemia (hypoglycemic unawareness).  Absorption of insulin  may be altered.  Alcohol consumption.  Pregnancy/menstrual cycles/postpartum. This may be due to hormones.  Certain kinds of tumors. This is very rare. SYMPTOMS   Sweating.  Hunger.  Dizziness.  Blurred vision.  Drowsiness.  Weakness.  Headache.  Rapid heart beat.  Shakiness.  Nervousness. DIAGNOSIS  Diagnosis is made by monitoring blood glucose in one or all  of the following ways:  Fingerstick blood glucose monitoring.  Laboratory  results. TREATMENT  If you think your blood glucose is low:  Check your blood glucose, if possible. If it is less than 70 mg/dl, take one of the following:  3-4 glucose tablets.   cup juice (prefer clear like apple).   cup "regular" soda pop.  1 cup milk.  -1 tube of glucose gel.  5-6 hard candies.  Do not over treat because your blood glucose (sugar) will only go too high.  Wait 15 minutes and recheck your blood glucose. If it is still less than 70 mg/dl (or below your target range), repeat treatment.  Eat a snack if it is more than one hour until your next meal. Sometimes, your blood glucose may go so low that you are unable to treat yourself. You may need someone to help you. You may even pass out or be unable to swallow. This may require you to get an injection of glucagon, which raises the blood glucose. HOME CARE INSTRUCTIONS  Check blood glucose as recommended by your caregiver.  Take medication as prescribed by your caregiver.  Follow your meal plan. Do not skip meals. Eat on time.  If you are going to drink alcohol, drink it only with meals.  Check your blood glucose before driving.  Check your blood glucose before and after exercise. If you exercise longer or different than usual, be sure to check blood glucose more frequently.  Always carry treatment with you. Glucose tablets are the easiest to carry.  Always wear medical alert jewelry or carry some form of identification that states that you have diabetes. This will alert people that you have diabetes. If you have hypoglycemia, they will have a better idea on what to do. SEEK MEDICAL CARE IF:   You are having problems keeping your blood sugar at target range.  You are having frequent episodes of hypoglycemia.  You feel you might be having side effects from your medicines.  You have symptoms of an illness that is not improving after 3-4 days.  You notice a change in vision or a new problem with your  vision. SEEK IMMEDIATE MEDICAL CARE IF:   You are a family member or friend of a person whose blood glucose goes below 70 mg/dl and is accompanied by:  Confusion.  A change in mental status.  The inability to swallow.  Passing out. Document Released: 02/18/2005 Document Revised: 05/13/2011 Document Reviewed: 06/17/2011 [facility] Patient Information 2014 [place], [place]. Blood Sugar Monitoring, Adult GLUCOSE METERS FOR SELF-MONITORING OF BLOOD GLUCOSE  It is important to be able to correctly measure your blood sugar (glucose). You can use a blood glucose monitor (a small battery-operated device) to check your glucose level at any time. This allows you and your caregiver to monitor your diabetes and to determine how well your treatment plan is working. The process of monitoring your blood glucose with a glucose meter is called self-monitoring of blood glucose (SMBG). When people with diabetes control their blood sugar, they have better health. To test for glucose with a typical glucose meter, place the disposable strip in the meter. Then place a small sample of blood on the "test strip." The test strip is coated with chemicals that combine with glucose in blood. The meter measures how much glucose is present. The meter displays the glucose level as a number. Several new models can record and store a number of test results. Some models can connect to personal computers  to store test results or print them out.  Newer meters are often easier to use than older models. Some meters allow you to get blood from places other than your fingertip. Some new models have automatic timing, error codes, signals, or barcode readers to help with proper adjustment (calibration). Some meters have a large display screen or spoken instructions for people with visual impairments.  INSTRUCTIONS FOR USING GLUCOSE METERS  Wash your hands with soap and warm water, or clean the area with alcohol. Dry your hands  completely.  Prick the side of your fingertip with a lancet (a sharp-pointed tool used by hand).  Hold the hand down and gently milk the finger until a small drop of blood appears. Catch the blood with the test strip.  Follow the instructions for inserting the test strip and using the SMBG meter. Most meters require the meter to be turned on and the test strip to be inserted before applying the blood sample.  Record the test result.  Read the instructions carefully for both the meter and the test strips that go with it. Meter instructions are found in the user manual. Keep this manual to help you solve any problems that may arise. Many meters use "error codes" when there is a problem with the meter, the test strip, or the blood sample on the strip. You will need the manual to understand these error codes and fix the problem.  New devices are available such as laser lancets and meters that can test blood taken from "alternative sites" of the body, other than fingertips. However, you should use standard fingertip testing if your glucose changes rapidly. Also, use standard testing if:  You have eaten, exercised, or taken insulin in the past 2 hours.  You think your glucose is low.  You tend to not feel symptoms of low blood glucose (hypoglycemia).  You are ill or under stress.  Clean the meter as directed by the manufacturer.  Test the meter for accuracy as directed by the manufacturer.  Take your meter with you to your caregiver's office. This way, you can test your glucose in front of your caregiver to make sure you are using the meter correctly. Your caregiver can also take a sample of blood to test using a routine lab method. If values on the glucose meter are close to the lab results, you and your caregiver will see that your meter is working well and you are using good technique. Your caregiver will advise you about what to do if the results do not match. FREQUENCY OF TESTING  Your  caregiver will tell you how often you should check your blood glucose. This will depend on your type of diabetes, your current level of diabetes control, and your types of medicines. The following are general guidelines, but your care plan may be different. Record all your readings and the time of day you took them for review with your caregiver.   Diabetes type 1.  When you are using insulin with good diabetic control (either multiple daily injections or via a pump), you should check your glucose 4 times a day.  If your diabetes is not well controlled, you may need to monitor more frequently, including before meals and 2 hours after meals, at bedtime, and occasionally between 2 a.m. and 3 a.m.  You should always check your glucose before a dose of insulin or before changing the rate on your insulin pump.  Diabetes type 2.  Guidelines for SMBG in diabetes  type 2 are not as well defined.  If you are on insulin, follow the guidelines above.  If you are on medicines, but not insulin, and your glucose is not well controlled, you should test at least twice daily.  If you are not on insulin, and your diabetes is controlled with medicines or diet alone, you should test at least once daily, usually before breakfast.  A weekly profile will help your caregiver advise you on your care plan. The week before your visit, check your glucose before a meal and 2 hours after a meal at least daily. You may want to test before and after a different meal each day so you and your caregiver can tell how well controlled your blood sugars are throughout the course of a 24 hour period.  Gestational diabetes (diabetes during pregnancy).  Frequent testing is often necessary. Accurate timing is important.  If you are not on insulin, check your glucose 4 times a day. Check it before breakfast and 1 hour after the start of each meal.  If you are on insulin, check your glucose 6 times a day. Check it before each meal  and 1 hour after the first bite of each meal.  General guidelines.  More frequent testing is required at the start of insulin treatment. Your caregiver will instruct you.  Test your glucose any time you suspect you have low blood sugar (hypoglycemia).  You should test more often when you change medicines, when you have unusual stress or illness, or in other unusual circumstances. OTHER THINGS TO KNOW ABOUT GLUCOSE METERS  Measurement Range. Most glucose meters are able to read glucose levels over a broad range of values from as low as 0 to as high as 600 mg/dL. If you get an extremely high or low reading from your meter, you should first confirm it with another reading. Report very high or very low readings to your caregiver.  Whole Blood Glucose versus Plasma Glucose. Some older home glucose meters measure glucose in your whole blood. In a lab or when using some newer home glucose meters, the glucose is measured in your plasma (one component of blood). The difference can be important. It is important for you and your caregiver to know whether your meter gives its results as "whole blood equivalent" or "plasma equivalent."  Display of High and Low Glucose Values. Part of learning how to operate a meter is understanding what the meter results mean. Know how high and low glucose concentrations are displayed on your meter.  Factors that Affect Glucose Meter Performance. The accuracy of your test results depends on many factors and varies depending on the brand and type of meter. These factors include:  Low red blood cell count (anemia).  Substances in your blood (such as uric acid, vitamin C, and others).  Environmental factors (temperature, humidity, altitude).  Name-brand versus generic test strips.  Calibration. Make sure your meter is set up properly. It is a good idea to do a calibration test with a control solution recommended by the manufacturer of your meter whenever you begin using a  fresh bottle of test strips. This will help verify the accuracy of your meter.  Improperly stored, expired, or defective test strips. Keep your strips in a dry place with the lid on.  Soiled meter.  Inadequate blood sample. NEW TECHNOLOGIES FOR GLUCOSE TESTING Alternative site testing Some glucose meters allow testing blood from alternative sites. These include the:  Upper arm.  Forearm.  Base of  the thumb.  Thigh. Sampling blood from alternative sites may be desirable. However, it may have some limitations. Blood in the fingertips show changes in glucose levels more quickly than blood in other parts of the body. This means that alternative site test results may be different from fingertip test results, not because of the meter's ability to test accurately, but because the actual glucose concentration can be different.  Continuous Glucose Monitoring Devices to measure your blood glucose continuously are available, and others are in development. These methods can be more expensive than self-monitoring with a glucose meter. However, it is uncertain how effective and reliable these devices are. Your caregiver will advise you if this approach makes sense for you. IF BLOOD SUGARS ARE CONTROLLED, PEOPLE WITH DIABETES REMAIN HEALTHIER.  SMBG is an important part of the treatment plan of patients with diabetes mellitus. Below are reasons for using SMBG:   It confirms that your glucose is at a specific, healthy level.  It detects hypoglycemia and severe hyperglycemia.  It allows you and your caregiver to make adjustments in response to changes in lifestyle for individuals requiring medicine.  It determines the need for starting insulin therapy in temporary diabetes that happens during pregnancy (gestational diabetes). Document Released: 02/21/2003 Document Revised: 05/13/2011 Document Reviewed: 06/14/2010 [facility] Patient Information 2014 [place], [place].

## 2012-08-26 NOTE — Progress Notes (Signed)
Patient here for follow up for DM Complains of having had shingles-today eyes are puffy

## 2012-10-06 ENCOUNTER — Telehealth: Payer: Self-pay | Admitting: Family Medicine

## 2012-10-06 NOTE — Telephone Encounter (Signed)
10/06/12 Patient requesting lower dose of Gabapentin 300mg  because it is much cheaper . Please sent prescription to [facility] on [address]. P.Ermine Stebbins,RN BSN [facility]

## 2012-10-06 NOTE — Telephone Encounter (Signed)
Pt says gabapentin (NEURONTIN) 400 MG capsule is expensive and is wondering if dosage can be lowered to 300 mg for lower cost.  Please f/u with pt.

## 2012-10-06 NOTE — Telephone Encounter (Signed)
Please call patient and find out why she wants to lower dose of gabapentin.  Please ask her if she is getting any benefit from the medication.  Please ask patient if the price would be any different if the dose was changed.   Maryln Manuel, MD

## 2012-10-12 ENCOUNTER — Ambulatory Visit: Payer: Self-pay | Attending: Family Medicine | Admitting: Internal Medicine

## 2012-10-12 ENCOUNTER — Encounter: Payer: Self-pay | Admitting: Internal Medicine

## 2012-10-12 VITALS — BP 136/85 | HR 86 | Temp 98.7°F | Ht 60.0 in | Wt 216.2 lb

## 2012-10-12 DIAGNOSIS — I10 Essential (primary) hypertension: Secondary | ICD-10-CM | POA: Insufficient documentation

## 2012-10-12 DIAGNOSIS — IMO0001 Reserved for inherently not codable concepts without codable children: Secondary | ICD-10-CM | POA: Insufficient documentation

## 2012-10-12 DIAGNOSIS — Z760 Encounter for issue of repeat prescription: Secondary | ICD-10-CM | POA: Insufficient documentation

## 2012-10-12 DIAGNOSIS — F329 Major depressive disorder, single episode, unspecified: Secondary | ICD-10-CM

## 2012-10-12 MED ORDER — GABAPENTIN 300 MG PO CAPS: 300.0000 mg | ORAL_CAPSULE | Freq: Three times a day (TID) | ORAL | Status: DC

## 2012-10-12 MED ORDER — GLIPIZIDE 5 MG PO TABS: 5.0000 mg | ORAL_TABLET | Freq: Two times a day (BID) | ORAL | Status: DC

## 2012-10-12 MED ORDER — DULOXETINE HCL 30 MG PO CPEP: 30.0000 mg | ORAL_CAPSULE | Freq: Every day | ORAL | Status: DC

## 2012-10-12 MED ORDER — HYDROCHLOROTHIAZIDE 25 MG PO TABS: 25.0000 mg | ORAL_TABLET | Freq: Every day | ORAL | Status: DC

## 2012-10-12 MED ORDER — METFORMIN HCL 1000 MG PO TABS: 1000.0000 mg | ORAL_TABLET | Freq: Two times a day (BID) | ORAL | Status: DC

## 2012-10-12 NOTE — Progress Notes (Signed)
Patient ID: Joy Solis, female   DOB: 07/10/1965, 47 y.o.   MRN: [id number]  CC: Medication refill  HPI: Patient was seen here today for medication refill. She has no specific complaint. She is now able to afford her medications because they're too expensive, she has not used her insulin she doesn't measure her blood sugar. She stopped taking lisinopril for the last one month because of cough that is disturbing. She found a place in [facility] now may help with medication refill with little or no co pay. Patient denies any headache, no chest pain, no visual impairment. No leg swelling.  No Known Allergies Past Medical History  Diagnosis Date  . Hypertension   . Diabetes mellitus   . Environmental allergies    Current Outpatient Prescriptions on File Prior to Visit  Medication Sig Dispense Refill  . aspirin 81 MG tablet Take 81 mg by mouth daily.      . fexofenadine (ALLEGRA) 180 MG tablet Take 1 tablet (180 mg total) by mouth daily.  20 tablet  0  . fish oil-omega-3 fatty acids 1000 MG capsule Take 2 g by mouth daily.      Marland Kitchen HYDROcodone-acetaminophen (NORCO) 7.5-325 MG per tablet Take 1 tablet by mouth every 4 (four) hours as needed for pain.  15 tablet  0  . insulin NPH (RELION N) 100 UNIT/ML injection Inject 15 Units into the skin at bedtime.  1 vial  12  . Insulin Syringe-Needle U-100 (RELION INSULIN SYR .3CC/29G) 29G X 1/2" 0.3 ML MISC 1 Syringe by Does not apply route as directed.  50 each  12  . ketoconazole (NIZORAL) 2 % cream Apply topically daily.  15 g  1  . lisinopril (PRINIVIL,ZESTRIL) 5 MG tablet Take 1 tablet (5 mg total) by mouth daily.  30 tablet  4  . meloxicam (MOBIC) 15 MG tablet Take 1 tablet (15 mg total) by mouth daily.  15 tablet  0  . PARoxetine (PAXIL) 20 MG tablet Take 20 mg by mouth every morning.      . PredniSONE 5 MG KIT Take 1 kit (5 mg total) by mouth daily after breakfast. Prednisone 5 mg 6 day dosepack.  Take as directed.  1 kit  0   No current  facility-administered medications on file prior to visit.   No family history on file. History   Social History  . Marital Status: Married    Spouse Name: N/A    Number of Children: N/A  . Years of Education: N/A   Occupational History  . Not on file.   Social History Main Topics  . Smoking status: Current Every Day Smoker -- 1.00 packs/day  . Smokeless tobacco: Not on file  . Alcohol Use: No  . Drug Use: No  . Sexually Active:    Other Topics Concern  . Not on file   Social History Narrative  . No narrative on file    Review of Systems: Constitutional: Negative for fever, chills, diaphoresis, activity change, appetite change and fatigue. HENT: Negative for ear pain, nosebleeds, congestion, facial swelling, rhinorrhea, neck pain, neck stiffness and ear discharge.  Eyes: Negative for pain, discharge, redness, itching and visual disturbance. Respiratory: Negative for cough, choking, chest tightness, shortness of breath, wheezing and stridor.  Cardiovascular: Negative for chest pain, palpitations and leg swelling. Gastrointestinal: Negative for abdominal distention. Genitourinary: Negative for dysuria, urgency, frequency, hematuria, flank pain, decreased urine volume, difficulty urinating and dyspareunia.  Musculoskeletal: Negative for back pain, joint swelling, arthralgias  and gait problem. Neurological: Negative for dizziness, tremors, seizures, syncope, facial asymmetry, speech difficulty, weakness, light-headedness, numbness and headaches.  Hematological: Negative for adenopathy. Does not bruise/bleed easily. Psychiatric/Behavioral: Negative for hallucinations, behavioral problems, confusion, dysphoric mood, decreased concentration and agitation.    Objective:   Filed Vitals:   10/12/12 1033  BP: 136/85  Pulse: 86  Temp: 98.7 F (37.1 C)    Physical Exam: Constitutional: Patient appears well-developed and well-nourished. No distress. HENT: Normocephalic,  atraumatic, External right and left ear normal. Oropharynx is clear and moist.  Eyes: Conjunctivae and EOM are normal. PERRLA, no scleral icterus. Neck: Normal ROM. Neck supple. No JVD. No tracheal deviation. No thyromegaly. CVS: RRR, S1/S2 +, no murmurs, no gallops, no carotid bruit.  Pulmonary: Effort and breath sounds normal, no stridor, rhonchi, wheezes, rales.  Abdominal: Soft. BS +,  no distension, tenderness, rebound or guarding.  Musculoskeletal: Normal range of motion. No edema and no tenderness.  Lymphadenopathy: No lymphadenopathy noted, cervical, inguinal or axillary Neuro: Alert. Normal reflexes, muscle tone coordination. No cranial nerve deficit. Skin: Skin is warm and dry. No rash noted. Not diaphoretic. No erythema. No pallor. Psychiatric: Normal mood and affect. Behavior, judgment, thought content normal.  Lab Results  Component Value Date   WBC 12.4* 03/18/2009   HGB 13.8 03/18/2009   HCT 40.8 03/18/2009   MCV 88.2 03/18/2009   PLT 302 03/18/2009   Lab Results  Component Value Date   CREATININE 0.65 08/12/2012   BUN 13 08/12/2012   NA 138 08/12/2012   K 4.3 08/12/2012   CL 102 08/12/2012   CO2 24 08/12/2012    Lab Results  Component Value Date   HGBA1C 9.0% 08/12/2012   Lipid Panel     Component Value Date/Time   CHOL 205* 08/12/2012 1353   TRIG 168* 08/12/2012 1353   HDL 38* 08/12/2012 1353   CHOLHDL 5.4 08/12/2012 1353   VLDL 34 08/12/2012 1353   LDLCALC 133* 08/12/2012 1353       Assessment and plan:   Patient Active Problem List   Diagnosis Date Noted  . Encounter for medication refill 10/12/2012  . Depression 10/12/2012  . HTN (hypertension) 10/12/2012  . Dyslipidemia, goal LDL below 100 08/13/2012  . Trigeminal neuralgia 08/12/2012  . Trigeminal neuralgia pain 08/12/2012  . Shingles 08/12/2012  . Type II or unspecified type diabetes mellitus without mention of complication, uncontrolled 08/12/2012    Gabapentin 300 mg by mouth 3 times a day for  neuropathic pain  Glucophage 1000 mg by mouth twice a day for diabetes  Glipizide 5 mg by mouth twice a day for diabetes, added because patient is not able to afford insulin  Hydrochlorothiazide 25 mg by mouth daily for hypertension  Cymbalta 30 mg by mouth daily for depression refill from old medication list   Patient to stop taking the lisinopril because of cough side effect Instead hydrochlorothiazide was prescribed and blood pressure is within normal Patient was counseled on medication compliance Smoking cessation counseling was reinforced She is to continue all her other medication including cholesterol meds  Leani Myron was given clear instructions to go to ER or return to the clinic if symptoms don't improve, worsen or new problems develop.  Joanie Coddington verbalized understanding.  Joaquina Nissen was told to call to get lab results if hasn't heard anything in the next week.    Jeanann Lewandowsky, MD [facility] [place], [place] [phone]   10/12/2012, 10:59 AM

## 2012-10-31 ENCOUNTER — Emergency Department (INDEPENDENT_AMBULATORY_CARE_PROVIDER_SITE_OTHER)
Admission: EM | Admit: 2012-10-31 | Discharge: 2012-10-31 | Disposition: A | Discharge status: Home / Self Care | Payer: Self-pay | Source: Home / Self Care

## 2012-10-31 ENCOUNTER — Encounter (HOSPITAL_COMMUNITY): Payer: Self-pay | Admitting: Emergency Medicine

## 2012-10-31 DIAGNOSIS — M545 Low back pain: Secondary | ICD-10-CM

## 2012-10-31 DIAGNOSIS — G8929 Other chronic pain: Secondary | ICD-10-CM

## 2012-10-31 MED ORDER — KETOROLAC TROMETHAMINE 30 MG/ML IJ SOLN
30.0000 mg | Freq: Once | INTRAMUSCULAR | Status: AC
  Administered 2012-10-31: 30 mg via INTRAMUSCULAR

## 2012-10-31 MED ORDER — KETOROLAC TROMETHAMINE 30 MG/ML IJ SOLN
INTRAMUSCULAR | Status: AC
  Filled 2012-10-31: qty 1

## 2012-10-31 MED ORDER — CYCLOBENZAPRINE HCL 5 MG PO TABS: 5.0000 mg | ORAL_TABLET | Freq: Three times a day (TID) | ORAL | Status: DC | PRN

## 2012-10-31 MED ORDER — KETOROLAC TROMETHAMINE 10 MG PO TABS: 10.0000 mg | ORAL_TABLET | Freq: Four times a day (QID) | ORAL | Status: DC | PRN

## 2012-10-31 NOTE — ED Notes (Signed)
Pt c/o lower back pain onset 2 weeks... Hx of pinched nerve and arthritis Pain is constant and increases w/activity... Radiates down left leg... Tingly numbness associated  Denies: recently inj/trauma Alert w/no signs of acute distress.

## 2012-10-31 NOTE — ED Provider Notes (Signed)
CSN: [id number]     Arrival date & time 10/31/12  1408 History   None    Chief Complaint  Patient presents with  . Back Pain   (Consider location/radiation/quality/duration/timing/severity/associated sxs/prior Treatment) Patient is a 47 y.o. female presenting with back pain. The history is provided by the patient.  Back Pain Location:  Lumbar spine Quality:  Shooting Radiates to:  L posterior upper leg, R posterior upper leg and L knee Pain severity:  Moderate Onset quality:  Gradual Progression:  Worsening Chronicity:  Chronic Relieved by:  None tried Worsened by:  Nothing tried Risk factors comment:  Chronic back and lower ext pain prob from obesity leading to djd knees.   Past Medical History  Diagnosis Date  . Hypertension   . Diabetes mellitus   . Environmental allergies    Past Surgical History  Procedure Laterality Date  . Knee surgery    . Hand surgery    . Ankle surgery     No family history on file. History  Substance Use Topics  . Smoking status: Current Every Day Smoker -- 1.00 packs/day  . Smokeless tobacco: Not on file  . Alcohol Use: No   OB History   Grav Para Term Preterm Abortions TAB SAB Ect Mult Living                 Review of Systems  Constitutional: Negative.   Gastrointestinal: Negative.   Musculoskeletal: Positive for back pain, joint swelling and gait problem. Negative for myalgias.  Skin: Negative.     Allergies  Review of patient's allergies indicates no known allergies.  Home Medications   Current Outpatient Rx  Name  Route  Sig  Dispense  Refill  . aspirin 81 MG tablet   Oral   Take 81 mg by mouth daily.         . cyclobenzaprine (FLEXERIL) 5 MG tablet   Oral   Take 1 tablet (5 mg total) by mouth 3 (three) times daily as needed for muscle spasms.   30 tablet   0   . DULoxetine (CYMBALTA) 30 MG capsule   Oral   Take 1 capsule (30 mg total) by mouth daily.   30 capsule   3   . fexofenadine (ALLEGRA) 180 MG  tablet   Oral   Take 1 tablet (180 mg total) by mouth daily.   20 tablet   0   . fish oil-omega-3 fatty acids 1000 MG capsule   Oral   Take 2 g by mouth daily.         Marland Kitchen gabapentin (NEURONTIN) 300 MG capsule   Oral   Take 1 capsule (300 mg total) by mouth 3 (three) times daily.   90 capsule   3   . glipiZIDE (GLUCOTROL) 5 MG tablet   Oral   Take 1 tablet (5 mg total) by mouth 2 (two) times daily before a meal.   60 tablet   3   . hydrochlorothiazide (HYDRODIURIL) 25 MG tablet   Oral   Take 1 tablet (25 mg total) by mouth daily.   90 tablet   3   . HYDROcodone-acetaminophen (NORCO) 7.5-325 MG per tablet   Oral   Take 1 tablet by mouth every 4 (four) hours as needed for pain.   15 tablet   0   . insulin NPH (RELION N) 100 UNIT/ML injection   Subcutaneous   Inject 15 Units into the skin at bedtime.   1 vial   12   .  Insulin Syringe-Needle U-100 (RELION INSULIN SYR .3CC/29G) 29G X 1/2" 0.3 ML MISC   Does not apply   1 Syringe by Does not apply route as directed.   50 each   12   . ketoconazole (NIZORAL) 2 % cream   Topical   Apply topically daily.   15 g   1   . ketorolac (TORADOL) 10 MG tablet   Oral   Take 1 tablet (10 mg total) by mouth every 6 (six) hours as needed for pain.   30 tablet   0   . lisinopril (PRINIVIL,ZESTRIL) 5 MG tablet   Oral   Take 1 tablet (5 mg total) by mouth daily.   30 tablet   4   . meloxicam (MOBIC) 15 MG tablet   Oral   Take 1 tablet (15 mg total) by mouth daily.   15 tablet   0   . metFORMIN (GLUCOPHAGE) 1000 MG tablet   Oral   Take 1 tablet (1,000 mg total) by mouth 2 (two) times daily with a meal.   180 tablet   3   . PARoxetine (PAXIL) 20 MG tablet   Oral   Take 20 mg by mouth every morning.         . PredniSONE 5 MG KIT   Oral   Take 1 kit (5 mg total) by mouth daily after breakfast. Prednisone 5 mg 6 day dosepack.  Take as directed.   1 kit   0    BP 133/73  Pulse 81  Temp(Src) 98.2 F (36.8  C) (Oral)  Resp 17  SpO2 99%  LMP 10/17/2012 Physical Exam  Nursing note and vitals reviewed. Constitutional: She is oriented to person, place, and time. She appears well-developed and well-nourished. She appears distressed.  Musculoskeletal: She exhibits tenderness.       Lumbar back: She exhibits decreased range of motion, tenderness, bony tenderness and pain. She exhibits normal pulse.       Back:  Neurological: She is alert and oriented to person, place, and time.  Skin: Skin is warm and dry.    ED Course  Procedures (including critical care time) Labs Review Labs Reviewed - No data to display Imaging Review No results found.  MDM   1. Chronic lower back pain       Linna Hoff, MD 10/31/12 [phone]

## 2012-11-13 ENCOUNTER — Ambulatory Visit: Payer: Self-pay | Attending: Family Medicine

## 2012-11-13 DIAGNOSIS — E7800 Pure hypercholesterolemia, unspecified: Secondary | ICD-10-CM

## 2012-11-13 LAB — LIPID PANEL
LDL Cholesterol: 147 mg/dL — ABNORMAL HIGH (ref 0–99)
Total CHOL/HDL Ratio: 5.4 Ratio
VLDL: 19 mg/dL (ref 0–40)

## 2012-11-26 ENCOUNTER — Encounter: Payer: Self-pay | Admitting: Family Medicine

## 2012-11-26 ENCOUNTER — Ambulatory Visit: Payer: Self-pay | Attending: Family Medicine | Admitting: Family Medicine

## 2012-11-26 VITALS — BP 150/97 | HR 69 | Temp 98.9°F | Ht 60.0 in | Wt 211.2 lb

## 2012-11-26 DIAGNOSIS — M5432 Sciatica, left side: Secondary | ICD-10-CM

## 2012-11-26 DIAGNOSIS — I10 Essential (primary) hypertension: Secondary | ICD-10-CM

## 2012-11-26 DIAGNOSIS — G8929 Other chronic pain: Secondary | ICD-10-CM | POA: Insufficient documentation

## 2012-11-26 DIAGNOSIS — M25559 Pain in unspecified hip: Secondary | ICD-10-CM | POA: Insufficient documentation

## 2012-11-26 DIAGNOSIS — E785 Hyperlipidemia, unspecified: Secondary | ICD-10-CM

## 2012-11-26 DIAGNOSIS — M545 Low back pain, unspecified: Secondary | ICD-10-CM | POA: Insufficient documentation

## 2012-11-26 DIAGNOSIS — Z23 Encounter for immunization: Secondary | ICD-10-CM

## 2012-11-26 DIAGNOSIS — M5430 Sciatica, unspecified side: Secondary | ICD-10-CM

## 2012-11-26 MED ORDER — KETOROLAC TROMETHAMINE 30 MG/ML IJ SOLN
30.0000 mg | Freq: Once | INTRAMUSCULAR | Status: AC
  Administered 2012-11-26: 30 mg via INTRAMUSCULAR

## 2012-11-26 MED ORDER — LOVASTATIN 40 MG PO TABS: 40.0000 mg | ORAL_TABLET | Freq: Every day | ORAL | Status: DC

## 2012-11-26 MED ORDER — TRAMADOL HCL 50 MG PO TABS: 50.0000 mg | ORAL_TABLET | Freq: Three times a day (TID) | ORAL | Status: DC | PRN

## 2012-11-26 NOTE — Patient Instructions (Addendum)
Cholesterol Cholesterol is a type of fat. Your body needs a small amount of cholesterol, but too much can cause health problems. Certain problems include heart attacks, strokes, and not enough blood flow to your heart, brain, kidneys, or feet. You get cholesterol in 2 ways:  Naturally.  By eating certain foods. HOME CARE  Eat a low-fat diet:  Eat less eggs, whole dairy products (whole milk, cheese, and butter), fatty meats, and fried foods.  Eat more fruits, vegetables, whole-wheat breads, lean chicken, and fish.  Follow your exercise program as told by your doctor.  Keep your weight at a healthy level. Talk to your doctor about what is right for you.  Only take medicine as told by your doctor.  Get your cholesterol checked once a year or as told by your doctor. MAKE SURE YOU:  Understand these instructions.  Will watch your condition.  Will get help right away if you are not doing well or get worse. Document Released: 05/17/2008 Document Revised: 05/13/2011 Document Reviewed: 05/17/2008 [facility] Patient Information 2014 [place], [place].    Hypertension As your heart beats, it forces blood through your arteries. This force is your blood pressure. If the pressure is too high, it is called hypertension (HTN) or high blood pressure. HTN is dangerous because you may have it and not know it. High blood pressure may mean that your heart has to work harder to pump blood. Your arteries may be narrow or stiff. The extra work puts you at risk for heart disease, stroke, and other problems.  Blood pressure consists of two numbers, a higher number over a lower, 110/72, for example. It is stated as "110 over 72." The ideal is below 120 for the top number (systolic) and under 80 for the bottom (diastolic). Write down your blood pressure today. You should pay close attention to your blood pressure if you have certain conditions such as:  Heart failure.  Prior heart  attack.  Diabetes  Chronic kidney disease.  Prior stroke.  Multiple risk factors for heart disease. To see if you have HTN, your blood pressure should be measured while you are seated with your arm held at the level of the heart. It should be measured at least twice. A one-time elevated blood pressure reading (especially in the Emergency Department) does not mean that you need treatment. There may be conditions in which the blood pressure is different between your right and left arms. It is important to see your caregiver soon for a recheck. Most people have essential hypertension which means that there is not a specific cause. This type of high blood pressure may be lowered by changing lifestyle factors such as:  Stress.  Smoking.  Lack of exercise.  Excessive weight.  Drug/tobacco/alcohol use.  Eating less salt. Most people do not have symptoms from high blood pressure until it has caused damage to the body. Effective treatment can often prevent, delay or reduce that damage. TREATMENT  When a cause has been identified, treatment for high blood pressure is directed at the cause. There are a large number of medications to treat HTN. These fall into several categories, and your caregiver will help you select the medicines that are best for you. Medications may have side effects. You should review side effects with your caregiver. If your blood pressure stays high after you have made lifestyle changes or started on medicines,   Your medication(s) may need to be changed.  Other problems may need to be addressed.  Be certain  you understand your prescriptions, and know how and when to take your medicine.  Be sure to follow up with your caregiver within the time frame advised (usually within two weeks) to have your blood pressure rechecked and to review your medications.  If you are taking more than one medicine to lower your blood pressure, make sure you know how and at what times they  should be taken. Taking two medicines at the same time can result in blood pressure that is too low. SEEK IMMEDIATE MEDICAL CARE IF:  You develop a severe headache, blurred or changing vision, or confusion.  You have unusual weakness or numbness, or a faint feeling.  You have severe chest or abdominal pain, vomiting, or breathing problems. MAKE SURE YOU:   Understand these instructions.  Will watch your condition.  Will get help right away if you are not doing well or get worse. Document Released: 02/18/2005 Document Revised: 05/13/2011 Document Reviewed: 10/09/2007 [facility] Patient Information 2014 [place], [place]. Blood Sugar Monitoring, Adult GLUCOSE METERS FOR SELF-MONITORING OF BLOOD GLUCOSE  It is important to be able to correctly measure your blood sugar (glucose). You can use a blood glucose monitor (a small battery-operated device) to check your glucose level at any time. This allows you and your caregiver to monitor your diabetes and to determine how well your treatment plan is working. The process of monitoring your blood glucose with a glucose meter is called self-monitoring of blood glucose (SMBG). When people with diabetes control their blood sugar, they have better health. To test for glucose with a typical glucose meter, place the disposable strip in the meter. Then place a small sample of blood on the "test strip." The test strip is coated with chemicals that combine with glucose in blood. The meter measures how much glucose is present. The meter displays the glucose level as a number. Several new models can record and store a number of test results. Some models can connect to personal computers to store test results or print them out.  Newer meters are often easier to use than older models. Some meters allow you to get blood from places other than your fingertip. Some new models have automatic timing, error codes, signals, or barcode readers to help with proper adjustment  (calibration). Some meters have a large display screen or spoken instructions for people with visual impairments.  INSTRUCTIONS FOR USING GLUCOSE METERS  Wash your hands with soap and warm water, or clean the area with alcohol. Dry your hands completely.  Prick the side of your fingertip with a lancet (a sharp-pointed tool used by hand).  Hold the hand down and gently milk the finger until a small drop of blood appears. Catch the blood with the test strip.  Follow the instructions for inserting the test strip and using the SMBG meter. Most meters require the meter to be turned on and the test strip to be inserted before applying the blood sample.  Record the test result.  Read the instructions carefully for both the meter and the test strips that go with it. Meter instructions are found in the user manual. Keep this manual to help you solve any problems that may arise. Many meters use "error codes" when there is a problem with the meter, the test strip, or the blood sample on the strip. You will need the manual to understand these error codes and fix the problem.  New devices are available such as laser lancets and meters that can test blood taken  from "alternative sites" of the body, other than fingertips. However, you should use standard fingertip testing if your glucose changes rapidly. Also, use standard testing if:  You have eaten, exercised, or taken insulin in the past 2 hours.  You think your glucose is low.  You tend to not feel symptoms of low blood glucose (hypoglycemia).  You are ill or under stress.  Clean the meter as directed by the manufacturer.  Test the meter for accuracy as directed by the manufacturer.  Take your meter with you to your caregiver's office. This way, you can test your glucose in front of your caregiver to make sure you are using the meter correctly. Your caregiver can also take a sample of blood to test using a routine lab method. If values on the  glucose meter are close to the lab results, you and your caregiver will see that your meter is working well and you are using good technique. Your caregiver will advise you about what to do if the results do not match. FREQUENCY OF TESTING  Your caregiver will tell you how often you should check your blood glucose. This will depend on your type of diabetes, your current level of diabetes control, and your types of medicines. The following are general guidelines, but your care plan may be different. Record all your readings and the time of day you took them for review with your caregiver.   Diabetes type 1.  When you are using insulin with good diabetic control (either multiple daily injections or via a pump), you should check your glucose 4 times a day.  If your diabetes is not well controlled, you may need to monitor more frequently, including before meals and 2 hours after meals, at bedtime, and occasionally between 2 a.m. and 3 a.m.  You should always check your glucose before a dose of insulin or before changing the rate on your insulin pump.  Diabetes type 2.  Guidelines for SMBG in diabetes type 2 are not as well defined.  If you are on insulin, follow the guidelines above.  If you are on medicines, but not insulin, and your glucose is not well controlled, you should test at least twice daily.  If you are not on insulin, and your diabetes is controlled with medicines or diet alone, you should test at least once daily, usually before breakfast.  A weekly profile will help your caregiver advise you on your care plan. The week before your visit, check your glucose before a meal and 2 hours after a meal at least daily. You may want to test before and after a different meal each day so you and your caregiver can tell how well controlled your blood sugars are throughout the course of a 24 hour period.  Gestational diabetes (diabetes during pregnancy).  Frequent testing is often necessary.  Accurate timing is important.  If you are not on insulin, check your glucose 4 times a day. Check it before breakfast and 1 hour after the start of each meal.  If you are on insulin, check your glucose 6 times a day. Check it before each meal and 1 hour after the first bite of each meal.  General guidelines.  More frequent testing is required at the start of insulin treatment. Your caregiver will instruct you.  Test your glucose any time you suspect you have low blood sugar (hypoglycemia).  You should test more often when you change medicines, when you have unusual stress or illness, or in  other unusual circumstances. OTHER THINGS TO KNOW ABOUT GLUCOSE METERS  Measurement Range. Most glucose meters are able to read glucose levels over a broad range of values from as low as 0 to as high as 600 mg/dL. If you get an extremely high or low reading from your meter, you should first confirm it with another reading. Report very high or very low readings to your caregiver.  Whole Blood Glucose versus Plasma Glucose. Some older home glucose meters measure glucose in your whole blood. In a lab or when using some newer home glucose meters, the glucose is measured in your plasma (one component of blood). The difference can be important. It is important for you and your caregiver to know whether your meter gives its results as "whole blood equivalent" or "plasma equivalent."  Display of High and Low Glucose Values. Part of learning how to operate a meter is understanding what the meter results mean. Know how high and low glucose concentrations are displayed on your meter.  Factors that Affect Glucose Meter Performance. The accuracy of your test results depends on many factors and varies depending on the brand and type of meter. These factors include:  Low red blood cell count (anemia).  Substances in your blood (such as uric acid, vitamin C, and others).  Environmental factors (temperature, humidity,  altitude).  Name-brand versus generic test strips.  Calibration. Make sure your meter is set up properly. It is a good idea to do a calibration test with a control solution recommended by the manufacturer of your meter whenever you begin using a fresh bottle of test strips. This will help verify the accuracy of your meter.  Improperly stored, expired, or defective test strips. Keep your strips in a dry place with the lid on.  Soiled meter.  Inadequate blood sample. NEW TECHNOLOGIES FOR GLUCOSE TESTING Alternative site testing Some glucose meters allow testing blood from alternative sites. These include the:  Upper arm.  Forearm.  Base of the thumb.  Thigh. Sampling blood from alternative sites may be desirable. However, it may have some limitations. Blood in the fingertips show changes in glucose levels more quickly than blood in other parts of the body. This means that alternative site test results may be different from fingertip test results, not because of the meter's ability to test accurately, but because the actual glucose concentration can be different.  Continuous Glucose Monitoring Devices to measure your blood glucose continuously are available, and others are in development. These methods can be more expensive than self-monitoring with a glucose meter. However, it is uncertain how effective and reliable these devices are. Your caregiver will advise you if this approach makes sense for you. IF BLOOD SUGARS ARE CONTROLLED, PEOPLE WITH DIABETES REMAIN HEALTHIER.  SMBG is an important part of the treatment plan of patients with diabetes mellitus. Below are reasons for using SMBG:   It confirms that your glucose is at a specific, healthy level.  It detects hypoglycemia and severe hyperglycemia.  It allows you and your caregiver to make adjustments in response to changes in lifestyle for individuals requiring medicine.  It determines the need for starting insulin therapy in  temporary diabetes that happens during pregnancy (gestational diabetes). Document Released: 02/21/2003 Document Revised: 05/13/2011 Document Reviewed: 06/14/2010 [facility] Patient Information 2014 [place], [place].

## 2012-11-26 NOTE — Progress Notes (Signed)
Patient ID: Joy Solis, female   DOB: 01-11-1966, 46 y.o.   MRN: [id number]  CC: follow up   HPI: Pt reports persistent left hip pain and sciatica and low back pain. She has not been taking her medications regularly.  She reports that she has been out of some of her medications and not being able to afford having the medications filled at the pharmacy.  She reports that she has not been able to take her blood sugar medication.  She says that she is willing to take it if she can get the prescription filled.     No Known Allergies Past Medical History  Diagnosis Date  . Hypertension   . Diabetes mellitus   . Environmental allergies    Current Outpatient Prescriptions on File Prior to Visit  Medication Sig Dispense Refill  . aspirin 81 MG tablet Take 81 mg by mouth daily.      . cyclobenzaprine (FLEXERIL) 5 MG tablet Take 1 tablet (5 mg total) by mouth 3 (three) times daily as needed for muscle spasms.  30 tablet  0  . DULoxetine (CYMBALTA) 30 MG capsule Take 1 capsule (30 mg total) by mouth daily.  30 capsule  3  . fexofenadine (ALLEGRA) 180 MG tablet Take 1 tablet (180 mg total) by mouth daily.  20 tablet  0  . fish oil-omega-3 fatty acids 1000 MG capsule Take 2 g by mouth daily.      Marland Kitchen gabapentin (NEURONTIN) 300 MG capsule Take 1 capsule (300 mg total) by mouth 3 (three) times daily.  90 capsule  3  . glipiZIDE (GLUCOTROL) 5 MG tablet Take 1 tablet (5 mg total) by mouth 2 (two) times daily before a meal.  60 tablet  3  . hydrochlorothiazide (HYDRODIURIL) 25 MG tablet Take 1 tablet (25 mg total) by mouth daily.  90 tablet  3  . HYDROcodone-acetaminophen (NORCO) 7.5-325 MG per tablet Take 1 tablet by mouth every 4 (four) hours as needed for pain.  15 tablet  0  . insulin NPH (RELION N) 100 UNIT/ML injection Inject 15 Units into the skin at bedtime.  1 vial  12  . Insulin Syringe-Needle U-100 (RELION INSULIN SYR .3CC/29G) 29G X 1/2" 0.3 ML MISC 1 Syringe by Does not apply route as directed.   50 each  12  . ketoconazole (NIZORAL) 2 % cream Apply topically daily.  15 g  1  . ketorolac (TORADOL) 10 MG tablet Take 1 tablet (10 mg total) by mouth every 6 (six) hours as needed for pain.  30 tablet  0  . lisinopril (PRINIVIL,ZESTRIL) 5 MG tablet Take 1 tablet (5 mg total) by mouth daily.  30 tablet  4  . metFORMIN (GLUCOPHAGE) 1000 MG tablet Take 1 tablet (1,000 mg total) by mouth 2 (two) times daily with a meal.  180 tablet  3  . PARoxetine (PAXIL) 20 MG tablet Take 20 mg by mouth every morning.      . PredniSONE 5 MG KIT Take 1 kit (5 mg total) by mouth daily after breakfast. Prednisone 5 mg 6 day dosepack.  Take as directed.  1 kit  0   No current facility-administered medications on file prior to visit.   No family history on file. History   Social History  . Marital Status: Married    Spouse Name: N/A    Number of Children: N/A  . Years of Education: N/A   Occupational History  . Not on file.   Social History Main  Topics  . Smoking status: Current Every Day Smoker -- 1.00 packs/day  . Smokeless tobacco: Not on file  . Alcohol Use: No  . Drug Use: No  . Sexual Activity:    Other Topics Concern  . Not on file   Social History Narrative  . No narrative on file    Review of Systems  Constitutional: Negative for fever, chills, diaphoresis, activity change, appetite change and fatigue.  HENT: Negative for ear pain, nosebleeds, congestion, facial swelling, rhinorrhea, neck pain, neck stiffness and ear discharge.   Eyes: Negative for pain, discharge, redness, itching and visual disturbance.  Respiratory: Negative for cough, choking, chest tightness, shortness of breath, wheezing and stridor.   Cardiovascular: Negative for chest pain, palpitations and leg swelling.  Gastrointestinal: Negative for abdominal distention.  Genitourinary: Negative for dysuria, urgency, frequency, hematuria, flank pain, decreased urine volume, difficulty urinating and dyspareunia.   Musculoskeletal: Negative for back pain, joint swelling, arthralgias and gait problem.  Neurological: Negative for dizziness, tremors, seizures, syncope, facial asymmetry, speech difficulty, weakness, light-headedness, numbness and headaches.  Hematological: Negative for adenopathy. Does not bruise/bleed easily.  Psychiatric/Behavioral: Negative for hallucinations, behavioral problems, confusion, dysphoric mood, decreased concentration and agitation.    Objective:   Filed Vitals:   11/26/12 0925  BP: 150/97  Pulse: 69  Temp: 98.9 F (37.2 C)    Physical Exam  Constitutional: Appears well-developed and well-nourished. No distress.  HENT: Normocephalic. External right and left ear normal. Oropharynx is clear and moist.  Eyes: Conjunctivae and EOM are normal. PERRLA, no scleral icterus.  Neck: Normal ROM. Neck supple. No JVD. No tracheal deviation. No thyromegaly.  CVS: RRR, S1/S2 +, no murmurs, no gallops, no carotid bruit.  Pulmonary: Effort and breath sounds normal, no stridor, rhonchi, wheezes, rales.  Abdominal: Soft. BS +,  no distension, tenderness, rebound or guarding.  Musculoskeletal:  tenderness in the lumbar spine and left side  Lymphadenopathy: No lymphadenopathy noted, cervical, inguinal. Neuro: Alert. Normal reflexes, muscle tone coordination. No cranial nerve deficit. Skin: Skin is warm and dry. No rash noted. Not diaphoretic. No erythema. No pallor.  Psychiatric: Normal mood and affect. Behavior, judgment, thought content normal.   Lab Results  Component Value Date   WBC 12.4* 03/18/2009   HGB 13.8 03/18/2009   HCT 40.8 03/18/2009   MCV 88.2 03/18/2009   PLT 302 03/18/2009   Lab Results  Component Value Date   CREATININE 0.65 08/12/2012   BUN 13 08/12/2012   NA 138 08/12/2012   K 4.3 08/12/2012   CL 102 08/12/2012   CO2 24 08/12/2012    Lab Results  Component Value Date   HGBA1C 8.9% 11/26/2012   Lipid Panel     Component Value Date/Time   CHOL 204*  11/13/2012 0916   TRIG 96 11/13/2012 0916   HDL 38* 11/13/2012 0916   CHOLHDL 5.4 11/13/2012 0916   VLDL 19 11/13/2012 0916   LDLCALC 147* 11/13/2012 0916      Assessment and plan:   Patient Active Problem List   Diagnosis Date Noted  . Chronic low back pain 11/26/2012  . Sciatica of left side 11/26/2012  . Encounter for medication refill 10/12/2012  . Depression 10/12/2012  . HTN (hypertension) 10/12/2012  . Dyslipidemia, goal LDL below 100 08/13/2012  . Trigeminal neuralgia 08/12/2012  . Trigeminal neuralgia pain 08/12/2012  . Shingles 08/12/2012  . Type II or unspecified type diabetes mellitus without mention of complication, uncontrolled 08/12/2012   Type II or unspecified type diabetes mellitus  without mention of complication, uncontrolled - Plan: HgB A1c  Chronic low back pain  Sciatica of left side  Dyslipidemia, goal LDL below 100  HTN (hypertension)  The patient was counseled on the dangers of tobacco use, and was advised to quit.  Reviewed strategies to maximize success, including removing cigarettes and smoking materials from environment.   Refer to pain clinic  Tramadol 50 mg prn severe pain, #45, no refills  Advised to please take medications as prescribed  Refill prescription for lovastatin 40 mg po daily   Monitor BS regularly  Flu vaccine  RTC in 3 weeks for recheck  The patient was given clear instructions to go to ER or return to medical center if symptoms don't improve, worsen or new problems develop.  The patient verbalized understanding.  The patient was told to call to get any lab results if not heard anything in the next week.    Rodney Langton, MD, CDE, FAAFP [facility] [place], [place]

## 2012-12-07 ENCOUNTER — Encounter: Payer: Self-pay | Admitting: Physical Medicine & Rehabilitation

## 2012-12-17 ENCOUNTER — Ambulatory Visit: Payer: Self-pay

## 2013-01-08 ENCOUNTER — Ambulatory Visit: Payer: Self-pay | Admitting: Physical Medicine & Rehabilitation

## 2013-02-02 ENCOUNTER — Ambulatory Visit: Payer: Private Health Insurance - Indemnity | Attending: Internal Medicine | Admitting: Internal Medicine

## 2013-02-02 VITALS — BP 168/96 | HR 80 | Temp 98.8°F | Resp 14 | Ht 60.0 in | Wt 209.8 lb

## 2013-02-02 DIAGNOSIS — IMO0001 Reserved for inherently not codable concepts without codable children: Secondary | ICD-10-CM

## 2013-02-02 LAB — COMPLETE METABOLIC PANEL WITH GFR
ALT: 18 U/L (ref 0–35)
Alkaline Phosphatase: 98 U/L (ref 39–117)
Creat: 0.52 mg/dL (ref 0.50–1.10)
GFR, Est African American: >89 mL/min
GFR, Est Non African American: >89 mL/min
Glucose, Bld: 244 mg/dL — ABNORMAL HIGH (ref 70–99)
Sodium: 134 mEq/L — ABNORMAL LOW (ref 135–145)
Total Bilirubin: 0.7 mg/dL (ref 0.3–1.2)
Total Protein: 6.7 g/dL (ref 6.0–8.3)

## 2013-02-02 LAB — LIPID PANEL
Cholesterol: 175 mg/dL (ref 0–200)
LDL Cholesterol: 114 mg/dL — ABNORMAL HIGH (ref 0–99)
Total CHOL/HDL Ratio: 4.6 Ratio
Triglycerides: 113 mg/dL (ref <150)
VLDL: 23 mg/dL (ref 0–40)

## 2013-02-02 LAB — CBC
HCT: 38.5 % (ref 36.0–46.0)
Hemoglobin: 12.5 g/dL (ref 12.0–15.0)
MCH: 26.7 pg (ref 26.0–34.0)
RBC: 4.69 MIL/uL (ref 3.87–5.11)

## 2013-02-02 LAB — TSH: TSH: 3.607 u[IU]/mL (ref 0.350–4.500)

## 2013-02-02 MED ORDER — CARVEDILOL 6.25 MG PO TABS: 6.2500 mg | ORAL_TABLET | Freq: Two times a day (BID) | ORAL | Status: DC

## 2013-02-02 MED ORDER — GLIPIZIDE 5 MG PO TABS: 5.0000 mg | ORAL_TABLET | Freq: Two times a day (BID) | ORAL | Status: DC

## 2013-02-02 MED ORDER — FREESTYLE SYSTEM KIT: 1.0000 | PACK | Freq: Three times a day (TID) | Status: DC

## 2013-02-02 MED ORDER — METFORMIN HCL 1000 MG PO TABS: 1000.0000 mg | ORAL_TABLET | Freq: Two times a day (BID) | ORAL | Status: DC

## 2013-02-02 MED ORDER — TRAMADOL HCL 50 MG PO TABS: 50.0000 mg | ORAL_TABLET | Freq: Three times a day (TID) | ORAL | Status: DC | PRN

## 2013-02-02 MED ORDER — LISINOPRIL 5 MG PO TABS: 5.0000 mg | ORAL_TABLET | Freq: Every day | ORAL | Status: DC

## 2013-02-02 MED ORDER — LOVASTATIN 40 MG PO TABS: 40.0000 mg | ORAL_TABLET | Freq: Every day | ORAL | Status: DC

## 2013-02-02 NOTE — Progress Notes (Unsigned)
Pt is here for medication refill, blood work and HBA1c.

## 2013-02-02 NOTE — Progress Notes (Unsigned)
Patient ID: Joy Solis, female   DOB: 28-Jul-1965, 47 y.o.   MRN: [id number] Patient Demographics  Joy Solis, is a 47 y.o. female  MVH:[id number]  [id number]:[id number]  DOB - Feb 25, 1966  Chief Complaint  Patient presents with  . office visit        Subjective:   Joy Solis with History of type 2 diabetes mellitus, dyslipidemia hypertension, chronic lower back pain and hip pain due to arthritis  is here for routine followup, she is run out of her medications except Glucophage for the last several weeks due to monetary constraints, no new subjective complaints. Sugars have been running in mid 300s at home.  Denies any subjective complaints except as above, no active headache, no chest abdominal pain at this time, not short of breath. No focal weakness which is new.   Objective:    Patient Active Problem List   Diagnosis Date Noted  . Chronic low back pain 11/26/2012  . Sciatica of left side 11/26/2012  . Encounter for medication refill 10/12/2012  . Depression 10/12/2012  . HTN (hypertension) 10/12/2012  . Dyslipidemia, goal LDL below 100 08/13/2012  . Trigeminal neuralgia 08/12/2012  . Trigeminal neuralgia pain 08/12/2012  . Shingles 08/12/2012  . Type II or unspecified type diabetes mellitus without mention of complication, uncontrolled 08/12/2012     Filed Vitals:   02/02/13 1103  BP: 168/96  Pulse: 80  Temp: 98.8 F (37.1 C)  TempSrc: Oral  Resp: 14  Height: 5' (1.524 m)  Weight: 209 lb 12.8 oz (95.165 kg)  SpO2: 99%     Exam   Awake Alert, Oriented X 3, No new F.N deficits, Normal affect [place].AT,PERRAL Supple Neck,No JVD, No cervical lymphadenopathy appriciated.  Symmetrical Chest wall movement, Good air movement bilaterally, CTAB RRR,No Gallops,Rubs or new Murmurs, No Parasternal Heave +ve B.Sounds, Abd Soft, Non tender, No organomegaly appriciated, No rebound - guarding or rigidity. No Cyanosis, Clubbing or edema, No new Rash or bruise      Data  Review   Lab Results  Component Value Date   WBC 12.4* 03/18/2009   HGB 13.8 03/18/2009   HCT 40.8 03/18/2009   MCV 88.2 03/18/2009   PLT 302 03/18/2009      Chemistry      Component Value Date/Time   NA 138 08/12/2012 1353   K 4.3 08/12/2012 1353   CL 102 08/12/2012 1353   CO2 24 08/12/2012 1353   BUN 13 08/12/2012 1353   CREATININE 0.65 08/12/2012 1353   CREATININE 0.62 03/18/2009 1418      Component Value Date/Time   CALCIUM 10.1 08/12/2012 1353   ALKPHOS 91 08/12/2012 1353   AST 9 08/12/2012 1353   ALT 14 08/12/2012 1353   BILITOT 0.4 08/12/2012 1353       Lab Results  Component Value Date   HGBA1C 8.9% 11/26/2012    Lab Results  Component Value Date   CHOL 204* 11/13/2012   HDL 38* 11/13/2012   LDLCALC 147* 11/13/2012   TRIG 96 11/13/2012   CHOLHDL 5.4 11/13/2012    Lab Results  Component Value Date   TSH 1.368 08/12/2012    No results found for this basename: PSA      Prior to Admission medications   Medication Sig Start Date End Date Taking? Authorizing Provider  glipiZIDE (GLUCOTROL) 5 MG tablet Take 1 tablet (5 mg total) by mouth 2 (two) times daily before a meal. 02/02/13  Yes Leroy Sea, MD  lovastatin (MEVACOR) 40 MG tablet  Take 1 tablet (40 mg total) by mouth at bedtime. 02/02/13  Yes Leroy Sea, MD  metFORMIN (GLUCOPHAGE) 1000 MG tablet Take 1 tablet (1,000 mg total) by mouth 2 (two) times daily with a meal. 02/02/13  Yes Leroy Sea, MD  traMADol (ULTRAM) 50 MG tablet Take 1 tablet (50 mg total) by mouth every 8 (eight) hours as needed. 02/02/13  Yes Leroy Sea, MD  aspirin 81 MG tablet Take 81 mg by mouth daily.    Historical Provider, MD  carvedilol (COREG) 6.25 MG tablet Take 1 tablet (6.25 mg total) by mouth 2 (two) times daily with a meal. 02/02/13   Leroy Sea, MD  DULoxetine (CYMBALTA) 30 MG capsule Take 1 capsule (30 mg total) by mouth daily. 10/12/12   Jeanann Lewandowsky, MD  fexofenadine (ALLEGRA) 180 MG tablet Take 1 tablet (180  mg total) by mouth daily. 08/03/12   Hayden Rasmussen, NP  fish oil-omega-3 fatty acids 1000 MG capsule Take 2 g by mouth daily.    Historical Provider, MD  gabapentin (NEURONTIN) 300 MG capsule Take 1 capsule (300 mg total) by mouth 3 (three) times daily. 10/12/12   Jeanann Lewandowsky, MD  glucose monitoring kit (FREESTYLE) monitoring kit 1 each by Does not apply route 4 (four) times daily - after meals and at bedtime. 1 month Diabetic Testing Supplies for QAC-QHS accuchecks. Any manufacturer is acceptable. 02/02/13   Leroy Sea, MD  hydrochlorothiazide (HYDRODIURIL) 25 MG tablet Take 1 tablet (25 mg total) by mouth daily. 10/12/12   Jeanann Lewandowsky, MD  insulin NPH (RELION N) 100 UNIT/ML injection Inject 15 Units into the skin at bedtime. 08/26/12   Clan[place] Cyndie Mull, MD  Insulin Syringe-Needle U-100 (RELION INSULIN SYR .3CC/29G) 29G X 1/2" 0.3 ML MISC 1 Syringe by Does not apply route as directed. 08/26/12   Clanford Cyndie Mull, MD  ketoconazole (NIZORAL) 2 % cream Apply topically daily. 08/12/12   Clanford Cyndie Mull, MD  lisinopril (PRINIVIL,ZESTRIL) 5 MG tablet Take 1 tablet (5 mg total) by mouth daily. 02/02/13   Leroy Sea, MD  PARoxetine (PAXIL) 20 MG tablet Take 20 mg by mouth every morning.    Historical Provider, MD     Assessment & Plan    Diabetes mellitus type II. Improved control, she is been out of her medications for the last few months, she's been taking Glucophage only, to his at home running in mid 300s. Have represcribed Glucophage, have represcribed Glucotrol 500 by mouth twice a day, given her testing supplies and asked her to do q. a.c. at bedtime Accu-Cheks maintain a logbook and come back in a week for further adjustment. A1c and CMP will be checked this visit. For now insulin has not been prescribed. One-time referral to moderately made. Referred to social work for assistance with medications   Hypertension in poor control but represcribed lisinopril, Coreg  added.   Chronic back pain. The represcribed tramadol.   Dyslipidemia. Check fasting lipid panel as she is fasting right now, continue on home dose Lipitor for now. She was out of it for a while    Routine health maintenance.  Screening labs. CBC, CMP, TSH, A1c, lipid panel  She is up-to-date on flu shot, OB referral made for mammogram and Pap smear   Leroy Sea M.D on 02/02/2013 at 11:52 AM    Patient was given clear instructions to go to ER or return to the clinic if symptoms don't improve, worsen or new problems develop. Patient  verbalized understanding. Patient was told to call to get lab results if hasn't heard anything in the next week.

## 2013-02-03 LAB — VITAMIN D 25 HYDROXY (VIT D DEFICIENCY, FRACTURES): Vit D, 25-Hydroxy: 19 ng/mL — ABNORMAL LOW (ref 30–89)

## 2013-02-10 ENCOUNTER — Telehealth: Payer: Self-pay | Admitting: Emergency Medicine

## 2013-02-10 ENCOUNTER — Telehealth: Payer: Self-pay | Admitting: Internal Medicine

## 2013-02-10 NOTE — Telephone Encounter (Signed)
Pt informed to schedule same day appt and if sx worsens seek urgent care

## 2013-02-10 NOTE — Telephone Encounter (Signed)
Pt is feeling pain on both sides of her lungs; Stomach is hurting at the top and all around; pt says it is painful for her to breathe; Pt has been constantly bleeding for three weeks; please f/u with pt to see if she needs urgent care [phone] pt says since she started her medication these symptoms have arrived

## 2013-02-10 NOTE — Telephone Encounter (Signed)
Spoke with pt regarding pain issues. Informed pt if experiencing lung pain and heavy bleeding, she needs to be seen at urgent care or be scheduled for same day appt. Pt states she will make appt to be seen

## 2013-02-23 ENCOUNTER — Telehealth: Payer: Self-pay | Admitting: *Deleted

## 2013-02-23 NOTE — Progress Notes (Signed)
Quick Note:  please have patient come back for another appointment within M.D. She is persistently high leukocytosis, low vitamin D levels. Needs evaluation for leukocytosis, and repeat vitamin D levels ______

## 2013-02-23 NOTE — Telephone Encounter (Signed)
Contacted pt to notify her of her lab results. Left a voicemail message for pt to give [place] a call back.

## 2013-03-04 HISTORY — PX: BACK SURGERY: SHX140

## 2013-03-12 ENCOUNTER — Ambulatory Visit: Payer: Self-pay | Admitting: Physical Medicine & Rehabilitation

## 2013-04-01 ENCOUNTER — Telehealth: Payer: Self-pay | Admitting: Internal Medicine

## 2013-04-01 NOTE — Telephone Encounter (Signed)
Pt came in today to see if she can get a call concerning her lab results; Pt also mentioned that she is unable to take her BP medication because she is experiencing side effects in her stomach; pt has been scheduled an appt in March; please advise pt on what to do concerning her medication reax; carvedilol (COREG) 6.25 MG tablet & lisinopril (PRINIVIL,ZESTRIL) 5 MG tablet

## 2013-04-05 NOTE — Telephone Encounter (Signed)
Patient was prescribed coreg and lisinopril States it upsets her stomach Has not taken it in a while Is there another medication we can prescribe for her Please advise

## 2013-04-09 ENCOUNTER — Encounter: Payer: Self-pay | Admitting: Internal Medicine

## 2013-04-09 ENCOUNTER — Ambulatory Visit: Payer: Self-pay | Attending: Internal Medicine | Admitting: Internal Medicine

## 2013-04-09 VITALS — BP 155/90 | HR 86 | Temp 98.6°F | Resp 16

## 2013-04-09 DIAGNOSIS — I10 Essential (primary) hypertension: Secondary | ICD-10-CM

## 2013-04-09 DIAGNOSIS — N390 Urinary tract infection, site not specified: Secondary | ICD-10-CM

## 2013-04-09 DIAGNOSIS — N3090 Cystitis, unspecified without hematuria: Secondary | ICD-10-CM

## 2013-04-09 DIAGNOSIS — E119 Type 2 diabetes mellitus without complications: Secondary | ICD-10-CM

## 2013-04-09 LAB — POCT URINALYSIS DIPSTICK
BILIRUBIN UA: NEGATIVE
Blood, UA: NEGATIVE
Glucose, UA: 250
LEUKOCYTES UA: NEGATIVE
Nitrite, UA: NEGATIVE
Protein, UA: 30
Spec Grav, UA: 1.025
Urobilinogen, UA: 0.2
pH, UA: 6

## 2013-04-09 LAB — GLUCOSE, POCT (MANUAL RESULT ENTRY): POC GLUCOSE: 194 mg/dL — AB (ref 70–99)

## 2013-04-09 MED ORDER — FREESTYLE SYSTEM KIT: 1.0000 | PACK | Freq: Three times a day (TID) | Status: AC

## 2013-04-09 MED ORDER — HYDROCHLOROTHIAZIDE 25 MG PO TABS: 25.0000 mg | ORAL_TABLET | Freq: Every day | ORAL | Status: DC

## 2013-04-09 NOTE — Addendum Note (Signed)
Addended by: Luberta Mutter R on: 04/09/2013 03:22 PM   Modules accepted: Orders

## 2013-04-09 NOTE — Progress Notes (Signed)
Pt here to f/u reaction to BP meds. States she was prescribed Coreg/Lisinopril x 1 month ago, now having abdominal pain. States both meds caused pain. BP today 155/90. C/o slight posterior tension h/a

## 2013-04-09 NOTE — Progress Notes (Signed)
Patient ID: Joy Solis, female   DOB: November 06, 1965, 48 y.o.   MRN: [id number]   CC:  HPI: 48 year old female with a history of dyslipidemia, hypertension, chronic low back pain who presents today because of a reaction to lisinopril. The patient had excruciating epigastric pain after taking 2 days worth of lisinopril. She has no history of angioedema. She alternated lisinopril and Coreg, however she cannot remember the sequence of taking these medications and ultimately decided to stop both medications and come back to the clinic for evaluation. Stomach pain has resolved, she does complain of left flank pain for the last 2 weeks in addition to her chronic back pain and wants to be investigated for UTI  No Known Allergies Past Medical History  Diagnosis Date  . Hypertension   . Diabetes mellitus   . Environmental allergies    Current Outpatient Prescriptions on File Prior to Visit  Medication Sig Dispense Refill  . aspirin 81 MG tablet Take 81 mg by mouth daily.      . carvedilol (COREG) 6.25 MG tablet Take 1 tablet (6.25 mg total) by mouth 2 (two) times daily with a meal.  60 tablet  3  . DULoxetine (CYMBALTA) 30 MG capsule Take 1 capsule (30 mg total) by mouth daily.  30 capsule  3  . fexofenadine (ALLEGRA) 180 MG tablet Take 1 tablet (180 mg total) by mouth daily.  20 tablet  0  . fish oil-omega-3 fatty acids 1000 MG capsule Take 2 g by mouth daily.      Marland Kitchen gabapentin (NEURONTIN) 300 MG capsule Take 1 capsule (300 mg total) by mouth 3 (three) times daily.  90 capsule  3  . glipiZIDE (GLUCOTROL) 5 MG tablet Take 1 tablet (5 mg total) by mouth 2 (two) times daily before a meal.  60 tablet  3  . glucose monitoring kit (FREESTYLE) monitoring kit 1 each by Does not apply route 4 (four) times daily - after meals and at bedtime. 1 month Diabetic Testing Supplies for QAC-QHS accuchecks. Any manufacturer is acceptable.  1 each  1  . hydrochlorothiazide (HYDRODIURIL) 25 MG tablet Take 1 tablet (25 mg  total) by mouth daily.  90 tablet  3  . insulin NPH (RELION N) 100 UNIT/ML injection Inject 15 Units into the skin at bedtime.  1 vial  12  . Insulin Syringe-Needle U-100 (RELION INSULIN SYR .3CC/29G) 29G X 1/2" 0.3 ML MISC 1 Syringe by Does not apply route as directed.  50 each  12  . ketoconazole (NIZORAL) 2 % cream Apply topically daily.  15 g  1  . lisinopril (PRINIVIL,ZESTRIL) 5 MG tablet Take 1 tablet (5 mg total) by mouth daily.  30 tablet  3  . lovastatin (MEVACOR) 40 MG tablet Take 1 tablet (40 mg total) by mouth at bedtime.  30 tablet  3  . metFORMIN (GLUCOPHAGE) 1000 MG tablet Take 1 tablet (1,000 mg total) by mouth 2 (two) times daily with a meal.  60 tablet  3  . PARoxetine (PAXIL) 20 MG tablet Take 20 mg by mouth every morning.      . traMADol (ULTRAM) 50 MG tablet Take 1 tablet (50 mg total) by mouth every 8 (eight) hours as needed.  60 tablet  1   No current facility-administered medications on file prior to visit.   No family history on file. History   Social History  . Marital Status: Married    Spouse Name: N/A    Number of Children: N/A  .  Years of Education: N/A   Occupational History  . Not on file.   Social History Main Topics  . Smoking status: Current Every Day Smoker -- 1.00 packs/day  . Smokeless tobacco: Not on file  . Alcohol Use: No  . Drug Use: No  . Sexual Activity:    Other Topics Concern  . Not on file   Social History Narrative  . No narrative on file    Review of Systems  Constitutional: Negative for fever, chills, diaphoresis, activity change, appetite change and fatigue.  HENT: Negative for ear pain, nosebleeds, congestion, facial swelling, rhinorrhea, neck pain, neck stiffness and ear discharge.   Eyes: Negative for pain, discharge, redness, itching and visual disturbance.  Respiratory: Negative for cough, choking, chest tightness, shortness of breath, wheezing and stridor.   Cardiovascular: Negative for chest pain, palpitations and  leg swelling.  Gastrointestinal: Negative for abdominal distention.  Genitourinary: Negative for dysuria, urgency, frequency, hematuria, flank pain, decreased urine volume, difficulty urinating and dyspareunia.  Musculoskeletal: Negative for back pain, joint swelling, arthralgias and gait problem.  Neurological: Negative for dizziness, tremors, seizures, syncope, facial asymmetry, speech difficulty, weakness, light-headedness, numbness and headaches.  Hematological: Negative for adenopathy. Does not bruise/bleed easily.  Psychiatric/Behavioral: Negative for hallucinations, behavioral problems, confusion, dysphoric mood, decreased concentration and agitation.    Objective:   Filed Vitals:   04/09/13 1448  BP: 155/90  Pulse: 86  Temp: 98.6 F (37 C)  Resp: 16    Physical Exam  Constitutional: Appears well-developed and well-nourished. No distress.  HENT: Normocephalic. External right and left ear normal. Oropharynx is clear and moist.  Eyes: Conjunctivae and EOM are normal. PERRLA, no scleral icterus.  Neck: Normal ROM. Neck supple. No JVD. No tracheal deviation. No thyromegaly.  CVS: RRR, S1/S2 +, no murmurs, no gallops, no carotid bruit.  Pulmonary: Effort and breath sounds normal, no stridor, rhonchi, wheezes, rales.  Abdominal: Soft. BS +,  no distension, tenderness, rebound or guarding.  Musculoskeletal: Normal range of motion. No edema and no tenderness.  Lymphadenopathy: No lymphadenopathy noted, cervical, inguinal. Neuro: Alert. Normal reflexes, muscle tone coordination. No cranial nerve deficit. Skin: Skin is warm and dry. No rash noted. Not diaphoretic. No erythema. No pallor.  Psychiatric: Normal mood and affect. Behavior, judgment, thought content normal.   Lab Results  Component Value Date   WBC 12.6* 02/02/2013   HGB 12.5 02/02/2013   HCT 38.5 02/02/2013   MCV 82.1 02/02/2013   PLT 400 02/02/2013   Lab Results  Component Value Date   CREATININE 0.52 02/02/2013   BUN  11 02/02/2013   NA 134* 02/02/2013   K 4.5 02/02/2013   CL 100 02/02/2013   CO2 25 02/02/2013    Lab Results  Component Value Date   HGBA1C 8.9% 11/26/2012   Lipid Panel     Component Value Date/Time   CHOL 175 02/02/2013 1153   TRIG 113 02/02/2013 1153   HDL 38* 02/02/2013 1153   CHOLHDL 4.6 02/02/2013 1153   VLDL 23 02/02/2013 1153   LDLCALC 114* 02/02/2013 1153       Assessment and plan:   Patient Active Problem List   Diagnosis Date Noted  . Chronic low back pain 11/26/2012  . Sciatica of left side 11/26/2012  . Encounter for medication refill 10/12/2012  . Depression 10/12/2012  . HTN (hypertension) 10/12/2012  . Dyslipidemia, goal LDL below 100 08/13/2012  . Trigeminal neuralgia 08/12/2012  . Trigeminal neuralgia pain 08/12/2012  . Shingles 08/12/2012  .  Type II or unspecified type diabetes mellitus without mention of complication, uncontrolled 08/12/2012   Hypertension Discontinue lisinopril Coreg Most likely intestinal angioedema secondary to lisinopril List ACE inhibitor as patient's allergy    Start HCTZ 25 Patient to followup in one week for blood pressure check   Cystitis?, Flank pain Check a urine dipstick   The patient was given clear instructions to go to ER or return to medical center if symptoms don't improve, worsen or new problems develop. The patient verbalized understanding. The patient was told to call to get any lab results if not heard anything in the next week.

## 2013-04-09 NOTE — Addendum Note (Signed)
Addended by: Allyson Sabal MD, Ascencion Dike on: 04/09/2013 03:20 PM   Modules accepted: Orders, Medications

## 2013-04-19 ENCOUNTER — Ambulatory Visit: Payer: Self-pay | Admitting: Internal Medicine

## 2013-04-21 ENCOUNTER — Other Ambulatory Visit: Payer: Self-pay | Admitting: Internal Medicine

## 2013-04-21 NOTE — Telephone Encounter (Signed)
Patient called requesting a refill on her tramadol

## 2013-05-13 ENCOUNTER — Ambulatory Visit: Payer: Private Health Insurance - Indemnity | Attending: Internal Medicine | Admitting: Internal Medicine

## 2013-05-13 ENCOUNTER — Encounter: Payer: Self-pay | Admitting: Internal Medicine

## 2013-05-13 VITALS — BP 137/83 | HR 87 | Temp 98.3°F | Resp 16 | Ht 60.0 in | Wt 208.0 lb

## 2013-05-13 DIAGNOSIS — E1110 Type 2 diabetes mellitus with ketoacidosis without coma: Secondary | ICD-10-CM

## 2013-05-13 DIAGNOSIS — E1310 Other specified diabetes mellitus with ketoacidosis without coma: Secondary | ICD-10-CM

## 2013-05-13 DIAGNOSIS — IMO0001 Reserved for inherently not codable concepts without codable children: Secondary | ICD-10-CM

## 2013-05-13 DIAGNOSIS — E1165 Type 2 diabetes mellitus with hyperglycemia: Secondary | ICD-10-CM

## 2013-05-13 LAB — POCT GLYCOSYLATED HEMOGLOBIN (HGB A1C): HEMOGLOBIN A1C: 9

## 2013-05-13 LAB — GLUCOSE, POCT (MANUAL RESULT ENTRY): POC Glucose: 281 mg/dl — AB (ref 70–99)

## 2013-05-13 MED ORDER — FLUCONAZOLE 150 MG PO TABS: 150.0000 mg | ORAL_TABLET | Freq: Once | ORAL | Status: DC

## 2013-05-13 MED ORDER — GLIPIZIDE 5 MG PO TABS: 5.0000 mg | ORAL_TABLET | Freq: Two times a day (BID) | ORAL | Status: DC

## 2013-05-13 MED ORDER — ACETAMINOPHEN-CODEINE #3 300-30 MG PO TABS: 1.0000 | ORAL_TABLET | ORAL | Status: DC | PRN

## 2013-05-13 MED ORDER — METFORMIN HCL 1000 MG PO TABS: 1000.0000 mg | ORAL_TABLET | Freq: Two times a day (BID) | ORAL | Status: DC

## 2013-05-13 NOTE — Progress Notes (Signed)
Patient ID: Joy Solis, female   DOB: 1965-09-07, 48 y.o.   MRN: [id number]   CC: Refill medicines  HPI: Patient is 48 year old female who presents to clinic requiring refill of medications. On the last visit she was also told she needs blood work on today's visit. She denies chest pain, shortness of breath, no abdominal or urinary concerns. She reports checking blood pressure regularly and the numbers are usually less than 140/90. Patient does report chronic lower back pain, throbbing and intermittent, 5/10 in severity, radiating to bilateral lower extremity, no specific aggravating or relieving factors, no recent trauma to the back area.  No Known Allergies Past Medical History  Diagnosis Date  . Hypertension   . Diabetes mellitus   . Environmental allergies    Current Outpatient Prescriptions on File Prior to Visit  Medication Sig Dispense Refill  . aspirin 81 MG tablet Take 81 mg by mouth daily.      . fish oil-omega-3 fatty acids 1000 MG capsule Take 2 g by mouth daily.      Marland Kitchen gabapentin (NEURONTIN) 300 MG capsule Take 1 capsule (300 mg total) by mouth 3 (three) times daily.  90 capsule  3  . hydrochlorothiazide (HYDRODIURIL) 25 MG tablet Take 1 tablet (25 mg total) by mouth daily.  90 tablet  3  . traMADol (ULTRAM) 50 MG tablet TAKE 1 TABLET BY MOUTH EVERY 8 HOURS AS NEEDED  60 tablet  PRN  . carvedilol (COREG) 6.25 MG tablet Take 1 tablet (6.25 mg total) by mouth 2 (two) times daily with a meal.  60 tablet  3  . DULoxetine (CYMBALTA) 30 MG capsule Take 1 capsule (30 mg total) by mouth daily.  30 capsule  3  . fexofenadine (ALLEGRA) 180 MG tablet Take 1 tablet (180 mg total) by mouth daily.  20 tablet  0  . glucose monitoring kit (FREESTYLE) monitoring kit 1 each by Does not apply route 4 (four) times daily - after meals and at bedtime. 1 month Diabetic Testing Supplies for QAC-QHS accuchecks. Any manufacturer is acceptable.  1 each  1  . hydrochlorothiazide (HYDRODIURIL) 25 MG tablet  Take 1 tablet (25 mg total) by mouth daily.  90 tablet  3  . insulin NPH (RELION N) 100 UNIT/ML injection Inject 15 Units into the skin at bedtime.  1 vial  12  . Insulin Syringe-Needle U-100 (RELION INSULIN SYR .3CC/29G) 29G X 1/2" 0.3 ML MISC 1 Syringe by Does not apply route as directed.  50 each  12  . ketoconazole (NIZORAL) 2 % cream Apply topically daily.  15 g  1  . lisinopril (PRINIVIL,ZESTRIL) 5 MG tablet Take 1 tablet (5 mg total) by mouth daily.  30 tablet  3  . PARoxetine (PAXIL) 20 MG tablet Take 20 mg by mouth every morning.       No current facility-administered medications on file prior to visit.   No known family medical history History   Social History  . Marital Status: Married    Spouse Name: N/A    Number of Children: N/A  . Years of Education: N/A   Occupational History  . Not on file.   Social History Main Topics  . Smoking status: Current Every Day Smoker -- 1.00 packs/day  . Smokeless tobacco: Not on file  . Alcohol Use: No  . Drug Use: No  . Sexual Activity:    Other Topics Concern  . Not on file   Social History Narrative  . No narrative on  file    Review of Systems  Constitutional: Negative for fever, chills, diaphoresis, activity change, appetite change and fatigue.  HENT: Negative for ear pain, nosebleeds, congestion, facial swelling, rhinorrhea, neck pain, neck stiffness and ear discharge.   Eyes: Negative for pain, discharge, redness, itching and visual disturbance.  Respiratory: Negative for cough, choking, chest tightness, shortness of breath, wheezing and stridor.   Cardiovascular: Negative for chest pain, palpitations and leg swelling.  Gastrointestinal: Negative for abdominal distention.  Genitourinary: Negative for dysuria, urgency, frequency, hematuria, flank pain, decreased urine volume, difficulty urinating and dyspareunia.  Musculoskeletal: Negative for back pain, joint swelling, arthralgias and gait problem.  Neurological:  Negative for dizziness, tremors, seizures, syncope, facial asymmetry, speech difficulty, weakness, light-headedness, numbness and headaches.  Hematological: Negative for adenopathy. Does not bruise/bleed easily.  Psychiatric/Behavioral: Negative for hallucinations, behavioral problems, confusion, dysphoric mood, decreased concentration and agitation.    Objective:   Filed Vitals:   05/13/13 1218  BP: 137/83  Pulse: 87  Temp: 98.3 F (36.8 C)  Resp: 16    Physical Exam  Constitutional: Appears well-developed and well-nourished. No distress.  HENT: Normocephalic. External right and left ear normal. Oropharynx is clear and moist.  Eyes: Conjunctivae and EOM are normal. PERRLA, no scleral icterus.  Neck: Normal ROM. Neck supple. No JVD. No tracheal deviation. No thyromegaly.  CVS: RRR, S1/S2 +, no murmurs, no gallops, no carotid bruit.  Pulmonary: Effort and breath sounds normal, no stridor, rhonchi, wheezes, rales.  Abdominal: Soft. BS +,  no distension, tenderness, rebound or guarding.  Musculoskeletal: Normal range of motion. No edema and no tenderness.  Lymphadenopathy: No lymphadenopathy noted, cervical, inguinal. Neuro: Alert. Normal reflexes, muscle tone coordination. No cranial nerve deficit. Skin: Skin is warm and dry. No rash noted. Not diaphoretic. No erythema. No pallor.  Psychiatric: Normal mood and affect. Behavior, judgment, thought content normal.   Lab Results  Component Value Date   WBC 12.6* 02/02/2013   HGB 12.5 02/02/2013   HCT 38.5 02/02/2013   MCV 82.1 02/02/2013   PLT 400 02/02/2013   Lab Results  Component Value Date   CREATININE 0.52 02/02/2013   BUN 11 02/02/2013   NA 134* 02/02/2013   K 4.5 02/02/2013   CL 100 02/02/2013   CO2 25 02/02/2013    Lab Results  Component Value Date   HGBA1C 9.0 05/13/2013   Lipid Panel     Component Value Date/Time   CHOL 175 02/02/2013 1153   TRIG 113 02/02/2013 1153   HDL 38* 02/02/2013 1153   CHOLHDL 4.6 02/02/2013 1153    VLDL 23 02/02/2013 1153   LDLCALC 114* 02/02/2013 1153       Assessment and plan:   Patient Active Problem List   Diagnosis Date Noted  .  continue analgesia as needed, apply warm or cold compresses whichever offers more relief    . HTN (hypertension) - stable and appears well controlled, provide refills on medicines  10/12/2012  . Dyslipidemia, goal LDL below 100 08/13/2012  . Type II or unspecified type diabetes mellitus without mention of complication, uncontrolled - we have spent time discussing diabetic diet, importance of regular CBG checks, foot care and importance of yearly eye examinations by ophthalmologist. Continue current medical regimen and come back in 3 months for reevaluation.  08/12/2012

## 2013-05-13 NOTE — Patient Instructions (Signed)
Sciatica °Sciatica is pain, weakness, numbness, or tingling along the path of the sciatic nerve. The nerve starts in the lower back and runs down the back of each leg. The nerve controls the muscles in the lower leg and in the back of the knee, while also providing sensation to the back of the thigh, lower leg, and the sole of your foot. Sciatica is a symptom of another medical condition. For instance, nerve damage or certain conditions, such as a herniated disk or bone spur on the spine, pinch or put pressure on the sciatic nerve. This causes the pain, weakness, or other sensations normally associated with sciatica. Generally, sciatica only affects one side of the body. °CAUSES  °· Herniated or slipped disc. °· Degenerative disk disease. °· A pain disorder involving the narrow muscle in the buttocks (piriformis syndrome). °· Pelvic injury or fracture. °· Pregnancy. °· Tumor (rare). °SYMPTOMS  °Symptoms can vary from mild to very severe. The symptoms usually travel from the low back to the buttocks and down the back of the leg. Symptoms can include: °· Mild tingling or dull aches in the lower back, leg, or hip. °· Numbness in the back of the calf or sole of the foot. °· Burning sensations in the lower back, leg, or hip. °· Sharp pains in the lower back, leg, or hip. °· Leg weakness. °· Severe back pain inhibiting movement. °These symptoms may get worse with coughing, sneezing, laughing, or prolonged sitting or standing. Also, being overweight may worsen symptoms. °DIAGNOSIS  °Your caregiver will perform a physical exam to look for common symptoms of sciatica. He or she may ask you to do certain movements or activities that would trigger sciatic nerve pain. Other tests may be performed to find the cause of the sciatica. These may include: °· Blood tests. °· X-rays. °· Imaging tests, such as an MRI or CT scan. °TREATMENT  °Treatment is directed at the cause of the sciatic pain. Sometimes, treatment is not necessary  and the pain and discomfort goes away on its own. If treatment is needed, your caregiver may suggest: °· Over-the-counter medicines to relieve pain. °· Prescription medicines, such as anti-inflammatory medicine, muscle relaxants, or narcotics. °· Applying heat or ice to the painful area. °· Steroid injections to lessen pain, irritation, and inflammation around the nerve. °· Reducing activity during periods of pain. °· Exercising and stretching to strengthen your abdomen and improve flexibility of your spine. Your caregiver may suggest losing weight if the extra weight makes the back pain worse. °· Physical therapy. °· Surgery to eliminate what is pressing or pinching the nerve, such as a bone spur or part of a herniated disk. °HOME CARE INSTRUCTIONS  °· Only take over-the-counter or prescription medicines for pain or discomfort as directed by your caregiver. °· Apply ice to the affected area for 20 minutes, 3 4 times a day for the first 48 72 hours. Then try heat in the same way. °· Exercise, stretch, or perform your usual activities if these do not aggravate your pain. °· Attend physical therapy sessions as directed by your caregiver. °· Keep all follow-up appointments as directed by your caregiver. °· Do not wear high heels or shoes that do not provide proper support. °· Check your mattress to see if it is too soft. A firm mattress may lessen your pain and discomfort. °SEEK IMMEDIATE MEDICAL CARE IF:  °· You lose control of your bowel or bladder (incontinence). °· You have increasing weakness in the lower back,   pelvis, buttocks, or legs. °· You have redness or swelling of your back. °· You have a burning sensation when you urinate. °· You have pain that gets worse when you lie down or awakens you at night. °· Your pain is worse than you have experienced in the past. °· Your pain is lasting longer than 4 weeks. °· You are suddenly losing weight without reason. °MAKE SURE YOU: °· Understand these  instructions. °· Will watch your condition. °· Will get help right away if you are not doing well or get worse. °Document Released: 02/12/2001 Document Revised: 08/20/2011 Document Reviewed: 06/30/2011 °[employer]® Patient Information ©2014 [employer]. ° °

## 2013-05-13 NOTE — Progress Notes (Signed)
Pt here to f/u issues with pain in leg. States Tramadol not working Pt taking meds daily

## 2013-05-28 ENCOUNTER — Ambulatory Visit (HOSPITAL_COMMUNITY)
Admission: RE | Admit: 2013-05-28 | Discharge: 2013-05-28 | Disposition: A | Discharge status: Home / Self Care | Payer: Managed Care, Other (non HMO) | Source: Ambulatory Visit | Attending: Internal Medicine | Admitting: Internal Medicine

## 2013-05-28 DIAGNOSIS — IMO0001 Reserved for inherently not codable concepts without codable children: Secondary | ICD-10-CM

## 2013-05-28 DIAGNOSIS — E1310 Other specified diabetes mellitus with ketoacidosis without coma: Secondary | ICD-10-CM | POA: Insufficient documentation

## 2013-05-28 DIAGNOSIS — E1165 Type 2 diabetes mellitus with hyperglycemia: Secondary | ICD-10-CM

## 2013-05-28 DIAGNOSIS — E1110 Type 2 diabetes mellitus with ketoacidosis without coma: Secondary | ICD-10-CM

## 2013-05-31 ENCOUNTER — Ambulatory Visit: Payer: Managed Care, Other (non HMO) | Attending: Internal Medicine | Admitting: Internal Medicine

## 2013-05-31 ENCOUNTER — Other Ambulatory Visit (HOSPITAL_COMMUNITY)
Admission: RE | Admit: 2013-05-31 | Discharge: 2013-05-31 | Disposition: A | Discharge status: Home / Self Care | Payer: Managed Care, Other (non HMO) | Source: Ambulatory Visit | Attending: Internal Medicine | Admitting: Internal Medicine

## 2013-05-31 ENCOUNTER — Encounter: Payer: Self-pay | Admitting: Internal Medicine

## 2013-05-31 VITALS — BP 130/85 | HR 96 | Temp 98.8°F | Resp 16 | Ht 60.0 in | Wt 202.0 lb

## 2013-05-31 DIAGNOSIS — Z1239 Encounter for other screening for malignant neoplasm of breast: Secondary | ICD-10-CM | POA: Insufficient documentation

## 2013-05-31 DIAGNOSIS — I10 Essential (primary) hypertension: Secondary | ICD-10-CM | POA: Insufficient documentation

## 2013-05-31 DIAGNOSIS — Z113 Encounter for screening for infections with a predominantly sexual mode of transmission: Secondary | ICD-10-CM | POA: Insufficient documentation

## 2013-05-31 DIAGNOSIS — M48062 Spinal stenosis, lumbar region with neurogenic claudication: Secondary | ICD-10-CM | POA: Insufficient documentation

## 2013-05-31 DIAGNOSIS — Z1151 Encounter for screening for human papillomavirus (HPV): Secondary | ICD-10-CM | POA: Insufficient documentation

## 2013-05-31 DIAGNOSIS — M545 Low back pain, unspecified: Secondary | ICD-10-CM | POA: Insufficient documentation

## 2013-05-31 DIAGNOSIS — Z124 Encounter for screening for malignant neoplasm of cervix: Secondary | ICD-10-CM | POA: Insufficient documentation

## 2013-05-31 DIAGNOSIS — N760 Acute vaginitis: Secondary | ICD-10-CM | POA: Insufficient documentation

## 2013-05-31 DIAGNOSIS — M48061 Spinal stenosis, lumbar region without neurogenic claudication: Secondary | ICD-10-CM

## 2013-05-31 DIAGNOSIS — E119 Type 2 diabetes mellitus without complications: Secondary | ICD-10-CM | POA: Insufficient documentation

## 2013-05-31 DIAGNOSIS — R7309 Other abnormal glucose: Secondary | ICD-10-CM | POA: Insufficient documentation

## 2013-05-31 DIAGNOSIS — G8929 Other chronic pain: Secondary | ICD-10-CM | POA: Insufficient documentation

## 2013-05-31 DIAGNOSIS — Z760 Encounter for issue of repeat prescription: Secondary | ICD-10-CM | POA: Insufficient documentation

## 2013-05-31 DIAGNOSIS — R739 Hyperglycemia, unspecified: Secondary | ICD-10-CM

## 2013-05-31 LAB — GLUCOSE, POCT (MANUAL RESULT ENTRY)
POC Glucose: 338 mg/dl — AB (ref 70–99)
POC Glucose: 414 mg/dl — AB (ref 70–99)

## 2013-05-31 MED ORDER — DAPAGLIFLOZIN PROPANEDIOL 10 MG PO TABS: 10.0000 mg | ORAL_TABLET | Freq: Every day | ORAL | Status: DC

## 2013-05-31 MED ORDER — INSULIN ASPART 100 UNIT/ML ~~LOC~~ SOLN
20.0000 [IU] | Freq: Once | SUBCUTANEOUS | Status: AC
  Administered 2013-05-31: 20 [IU] via SUBCUTANEOUS

## 2013-05-31 MED ORDER — TRAMADOL HCL 50 MG PO TABS: 50.0000 mg | ORAL_TABLET | Freq: Two times a day (BID) | ORAL | Status: DC | PRN

## 2013-05-31 MED ORDER — DAPAGLIFLOZIN PROPANEDIOL 5 MG PO TABS: 5.0000 mg | ORAL_TABLET | Freq: Every day | ORAL | Status: DC

## 2013-05-31 MED ORDER — GABAPENTIN 300 MG PO CAPS: 300.0000 mg | ORAL_CAPSULE | Freq: Three times a day (TID) | ORAL | Status: DC

## 2013-05-31 NOTE — Progress Notes (Signed)
Pt is here following up on her diabetes and HTN. Pt is here to get a Pap smear.

## 2013-05-31 NOTE — Progress Notes (Signed)
Patient ID: Joy Solis, female   DOB: 02-04-1966, 48 y.o.   MRN: [id number]   Joy Solis, is a 48 y.o. female  [id number]  [id number]  DOB - [birth year]  Chief Complaint  Patient presents with  . Follow-up        Subjective:   Joy Solis is a 48 y.o. female here today for a follow up visit. Patient is known to have hypertension, type 2 diabetes, obesity, dyslipidemia, and chronic low back pain. She was recently seen in the clinic for medication refills, here today for Pap smear. Her CBG in the clinic today is about 400 and her hemoglobin A1c recently was 9.0%. She has no complaint today. She does not check her blood sugar at home, she claims her blood pressure is controlled however. She used to be on insulin regimen but she was weaned off and started on oral hypoglycemic, she reports no side effects to medications. She claims compliant. She continues to smoke cigarette about one pack per day, she denies the use of alcohol. Patient has No headache, No chest pain, No abdominal pain - No Nausea, No new weakness tingling or numbness, No Cough - SOB.  Problem  Pap Smear for Cervical Cancer Screening    ALLERGIES: No Known Allergies  PAST MEDICAL HISTORY: Past Medical History  Diagnosis Date  . Hypertension   . Diabetes mellitus   . Environmental allergies     MEDICATIONS AT HOME: Prior to Admission medications   Medication Sig Start Date End Date Taking? Authorizing Provider  acetaminophen-codeine (TYLENOL #3) 300-30 MG per tablet Take 1 tablet by mouth every 4 (four) hours as needed for moderate pain. 05/13/13  Yes Theodis Blaze, MD  aspirin 81 MG tablet Take 81 mg by mouth daily.   Yes Historical Provider, MD  atorvastatin (LIPITOR) 20 MG tablet Take 20 mg by mouth daily.   Yes Historical Provider, MD  glipiZIDE (GLUCOTROL) 5 MG tablet Take 1 tablet (5 mg total) by mouth 2 (two) times daily before a meal. 05/13/13  Yes Theodis Blaze, MD  hydrochlorothiazide  (HYDRODIURIL) 25 MG tablet Take 1 tablet (25 mg total) by mouth daily. 10/12/12  Yes Angelica Chessman, MD  metFORMIN (GLUCOPHAGE) 1000 MG tablet Take 1 tablet (1,000 mg total) by mouth 2 (two) times daily with a meal. 05/13/13  Yes Theodis Blaze, MD  carvedilol (COREG) 6.25 MG tablet Take 1 tablet (6.25 mg total) by mouth 2 (two) times daily with a meal. 02/02/13   Thurnell Lose, MD  Dapagliflozin Propanediol (FARXIGA) 5 MG TABS Take 5 mg by mouth daily. 05/31/13   Angelica Chessman, MD  DULoxetine (CYMBALTA) 30 MG capsule Take 1 capsule (30 mg total) by mouth daily. 10/12/12   Angelica Chessman, MD  fexofenadine (ALLEGRA) 180 MG tablet Take 1 tablet (180 mg total) by mouth daily. 08/03/12   Janne Napoleon, NP  fish oil-omega-3 fatty acids 1000 MG capsule Take 2 g by mouth daily.    Historical Provider, MD  fluconazole (DIFLUCAN) 150 MG tablet Take 1 tablet (150 mg total) by mouth once. 05/13/13   Theodis Blaze, MD  gabapentin (NEURONTIN) 300 MG capsule Take 1 capsule (300 mg total) by mouth 3 (three) times daily. 05/31/13   Angelica Chessman, MD  glucose monitoring kit (FREESTYLE) monitoring kit 1 each by Does not apply route 4 (four) times daily - after meals and at bedtime. 1 month Diabetic Testing Supplies for QAC-QHS accuchecks. Any manufacturer is acceptable. 04/09/13   Reyne Dumas,  MD  hydrochlorothiazide (HYDRODIURIL) 25 MG tablet Take 1 tablet (25 mg total) by mouth daily. 04/09/13   Richarda Overlie, MD  insulin NPH (RELION N) 100 UNIT/ML injection Inject 15 Units into the skin at bedtime. 08/26/12   [place] Cyndie Mull, MD  Insulin Syringe-Needle U-100 (RELION INSULIN SYR .3CC/29G) 29G X 1/2" 0.3 ML MISC 1 Syringe by Does not apply route as directed. 08/26/12   Clanford Cyndie Mull, MD  ketoconazole (NIZORAL) 2 % cream Apply topically daily. 08/12/12   Clanford Cyndie Mull, MD  lisinopril (PRINIVIL,ZESTRIL) 5 MG tablet Take 1 tablet (5 mg total) by mouth daily. 02/02/13   Leroy Sea, MD  PARoxetine (PAXIL) 20  MG tablet Take 20 mg by mouth every morning.    Historical Provider, MD  traMADol (ULTRAM) 50 MG tablet Take 1 tablet (50 mg total) by mouth every 12 (twelve) hours as needed. 05/31/13   Jeanann Lewandowsky, MD     Objective:   Filed Vitals:   05/31/13 1000  BP: 130/85  Pulse: 96  Temp: 98.8 F (37.1 C)  TempSrc: Oral  Resp: 16  Height: 5' (1.524 m)  Weight: 202 lb (91.627 kg)  SpO2: 96%    Exam General appearance : Awake, alert, not in any distress. Speech Clear. Not toxic looking, morbidly obese HEENT: Atraumatic and Normocephalic, pupils equally reactive to light and accomodation Neck: supple, no JVD. No cervical lymphadenopathy.  Chest:Good air entry bilaterally, no added sounds  CVS: S1 S2 regular, no murmurs.  Abdomen: Bowel sounds present, Non tender and not distended with no gaurding, rigidity or rebound. Extremities: B/L Lower Ext shows no edema, both legs are warm to touch Neurology: Awake alert, and oriented X 3, CN II-XII intact, Non focal Skin:No Rash Pelvic Examination: Normal female external genitalia, central cervix, copious introitus discharge, negative cervical motion tenderness  Data Review Lab Results  Component Value Date   HGBA1C 9.0 05/13/2013   HGBA1C 8.9% 11/26/2012   HGBA1C 9.0% 08/12/2012     Assessment & Plan   1. Uncontrolled type 2 diabetes For severe hyperglycemia, patient was given IV fluid normal saline 1 L in the clinic And subacute regular insulin 20 units, blood sugar came down from about 400 to 240 Patient was discharged with instructions to monitor blood glucose very closely her to adhere strictly with nutritional advice including low carbohydrate diet  Dapagliflozin 10 mg tablet by mouth daily with a distal anti-diabetic regimen Patient will monitor glucose at least twice daily for the next one month, record blood sugar and bring blood sugar log to the clinic next visit  2. Pap smear for cervical cancer screening  - Cytology -  PAP - Cervicovaginal ancillary only - Glucose (CBG)  3. Chronic low back pain Tylenol No. 3 30-300 mg tablet by mouth every 6 hour when necessary pain  4. HTN (hypertension) Continue hydrochlorothiazide 25 mg tablet by mouth daily Continue carvedilol 6.25 mg tablet by mouth twice a day  5. Encounter for medication refill  - gabapentin (NEURONTIN) 300 MG capsule; Take 1 capsule (300 mg total) by mouth 3 (three) times daily.  Dispense: 180 capsule; Refill: 3  Patient was extensively counseled on nutrition and exercise Patient was again counseled extensively on smoking cessation   Return for Hemoglobin A1C and Follow up, DM, Follow up HTN.  The patient was given clear instructions to go to ER or return to medical center if symptoms don't improve, worsen or new problems develop. The patient verbalized understanding. The patient was  told to call to get lab results if they haven't heard anything in the next week.   This note has been created with Surveyor, quantity. Any transcriptional errors are unintentional.    Angelica Chessman, MD, [place], [place], [place] and [facility] [place], [place]   05/31/2013, 10:44 AM

## 2013-05-31 NOTE — Patient Instructions (Signed)
Diabetes Meal Planning Guide The diabetes meal planning guide is a tool to help you plan your meals and snacks. It is important for people with diabetes to manage their blood glucose (sugar) levels. Choosing the right foods and the right amounts throughout your day will help control your blood glucose. Eating right can even help you improve your blood pressure and reach or maintain a healthy weight. CARBOHYDRATE COUNTING MADE EASY When you eat carbohydrates, they turn to sugar. This raises your blood glucose level. Counting carbohydrates can help you control this level so you feel better. When you plan your meals by counting carbohydrates, you can have more flexibility in what you eat and balance your medicine with your food intake. Carbohydrate counting simply means adding up the total amount of carbohydrate grams in your meals and snacks. Try to eat about the same amount at each meal. Foods with carbohydrates are listed below. Each portion below is 1 carbohydrate serving or 15 grams of carbohydrates. Ask your dietician how many grams of carbohydrates you should eat at each meal or snack. Grains and Starches  1 slice bread.   English muffin or hotdog/hamburger bun.   cup cold cereal (unsweetened).   cup cooked pasta or rice.   cup starchy vegetables (corn, potatoes, peas, beans, winter squash).  1 tortilla (6 inches).   bagel.  1 waffle or pancake (size of a CD).   cup cooked cereal.  4 to 6 small crackers. *Whole grain is recommended. Fruit  1 cup fresh unsweetened berries, melon, papaya, pineapple.  1 small fresh fruit.   banana or mango.   cup fruit juice (4 oz unsweetened).   cup canned fruit in natural juice or water.  2 tbs dried fruit.  12 to 15 grapes or cherries. Milk and Yogurt  1 cup fat-free or 1% milk.  1 cup soy milk.  6 oz light yogurt with sugar-free sweetener.  6 oz low-fat soy yogurt.  6 oz plain yogurt. Vegetables  1 cup raw or  cup  cooked is counted as 0 carbohydrates or a "free" food.  If you eat 3 or more servings at 1 meal, count them as 1 carbohydrate serving. Other Carbohydrates   oz chips or pretzels.   cup ice cream or frozen yogurt.   cup sherbet or sorbet.  2 inch square cake, no frosting.  1 tbs honey, sugar, jam, jelly, or syrup.  2 small cookies.  3 squares of graham crackers.  3 cups popcorn.  6 crackers.  1 cup broth-based soup.  Count 1 cup casserole or other mixed foods as 2 carbohydrate servings.  Foods with less than 20 calories in a serving may be counted as 0 carbohydrates or a "free" food. You may want to purchase a book or computer software that lists the carbohydrate gram counts of different foods. In addition, the nutrition facts panel on the labels of the foods you eat are a good source of this information. The label will tell you how big the serving size is and the total number of carbohydrate grams you will be eating per serving. Divide this number by 15 to obtain the number of carbohydrate servings in a portion. Remember, 1 carbohydrate serving equals 15 grams of carbohydrate. SERVING SIZES Measuring foods and serving sizes helps you make sure you are getting the right amount of food. The list below tells how big or small some common serving sizes are.  1 oz.........4 stacked dice.  3 oz.........Deck of cards.  1 tsp........Tip   of little finger.  1 tbs........Thumb.  2 tbs........Golf ball.   cup.......Half of a fist.  1 cup........A fist. SAMPLE DIABETES MEAL PLAN Below is a sample meal plan that includes foods from the grain and starches, dairy, vegetable, fruit, and meat groups. A dietician can individualize a meal plan to fit your calorie needs and tell you the number of servings needed from each food group. However, controlling the total amount of carbohydrates in your meal or snack is more important than making sure you include all of the food groups at every  meal. You may interchange carbohydrate containing foods (dairy, starches, and fruits). The meal plan below is an example of a 2000 calorie diet using carbohydrate counting. This meal plan has 17 carbohydrate servings. Breakfast  1 cup oatmeal (2 carb servings).   cup light yogurt (1 carb serving).  1 cup blueberries (1 carb serving).   cup almonds. Snack  1 large apple (2 carb servings).  1 low-fat string cheese stick. Lunch  Chicken breast salad.  1 cup spinach.   cup chopped tomatoes.  2 oz chicken breast, sliced.  2 tbs low-fat Italian dressing.  12 whole-wheat crackers (2 carb servings).  12 to 15 grapes (1 carb serving).  1 cup low-fat milk (1 carb serving). Snack  1 cup carrots.   cup hummus (1 carb serving). Dinner  3 oz broiled salmon.  1 cup brown rice (3 carb servings). Snack  1  cups steamed broccoli (1 carb serving) drizzled with 1 tsp olive oil and lemon juice.  1 cup light pudding (2 carb servings). DIABETES MEAL PLANNING WORKSHEET Your dietician can use this worksheet to help you decide how many servings of foods and what types of foods are right for you.  BREAKFAST Food Group and Servings / Carb Servings Grain/Starches __________________________________ Dairy __________________________________________ Vegetable ______________________________________ Fruit ___________________________________________ Meat __________________________________________ Fat ____________________________________________ LUNCH Food Group and Servings / Carb Servings Grain/Starches ___________________________________ Dairy ___________________________________________ Fruit ____________________________________________ Meat ___________________________________________ Fat _____________________________________________ DINNER Food Group and Servings / Carb Servings Grain/Starches ___________________________________ Dairy  ___________________________________________ Fruit ____________________________________________ Meat ___________________________________________ Fat _____________________________________________ SNACKS Food Group and Servings / Carb Servings Grain/Starches ___________________________________ Dairy ___________________________________________ Vegetable _______________________________________ Fruit ____________________________________________ Meat ___________________________________________ Fat _____________________________________________ DAILY TOTALS Starches _________________________ Vegetable ________________________ Fruit ____________________________ Dairy ____________________________ Meat ____________________________ Fat ______________________________ Document Released: 11/15/2004 Document Revised: 05/13/2011 Document Reviewed: 09/26/2008 [employer] Patient Information 2014 [employer]. DASH Diet The DASH diet stands for "Dietary Approaches to Stop Hypertension." It is a healthy eating plan that has been shown to reduce high blood pressure (hypertension) in as little as 14 days, while also possibly providing other significant health benefits. These other health benefits include reducing the risk of breast cancer after menopause and reducing the risk of type 2 diabetes, heart disease, colon cancer, and stroke. Health benefits also include weight loss and slowing kidney failure in patients with chronic kidney disease.  DIET GUIDELINES  Limit salt (sodium). Your diet should contain less than 1500 mg of sodium daily.  Limit refined or processed carbohydrates. Your diet should include mostly whole grains. Desserts and added sugars should be used sparingly.  Include small amounts of heart-healthy fats. These types of fats include nuts, oils, and tub margarine. Limit saturated and trans fats. These fats have been shown to be harmful in the body. CHOOSING FOODS  The following food groups  are based on a 2000 calorie diet. See your Registered Dietitian for individual calorie needs. Grains and Grain Products (6 to 8 servings daily)  Eat More Often:   Whole-wheat bread, brown rice, whole-grain or wheat pasta, quinoa, popcorn without added fat or salt (air popped).  Eat Less Often: White bread, white pasta, white rice, cornbread. Vegetables (4 to 5 servings daily)  Eat More Often: Fresh, frozen, and canned vegetables. Vegetables may be raw, steamed, roasted, or grilled with a minimal amount of fat.  Eat Less Often/Avoid: Creamed or fried vegetables. Vegetables in a cheese sauce. Fruit (4 to 5 servings daily)  Eat More Often: All fresh, canned (in natural juice), or frozen fruits. Dried fruits without added sugar. One hundred percent fruit juice ( cup [237 mL] daily).  Eat Less Often: Dried fruits with added sugar. Canned fruit in light or heavy syrup. [employer], Fish, and Poultry (2 servings or less daily. One serving is 3 to 4 oz [85-114 g]).  Eat More Often: Ninety percent or leaner ground beef, tenderloin, sirloin. Round cuts of beef, chicken breast, [place] breast. All fish. Grill, bake, or broil your meat. Nothing should be fried.  Eat Less Often/Avoid: Fatty cuts of meat, [place], or chicken leg, thigh, or wing. Fried cuts of meat or fish. Dairy (2 to 3 servings)  Eat More Often: Low-fat or fat-free milk, low-fat plain or light yogurt, reduced-fat or part-skim cheese.  Eat Less Often/Avoid: Milk (whole, 2%).Whole milk yogurt. Full-fat cheeses. Nuts, Seeds, and Legumes (4 to 5 servings per week)  Eat More Often: All without added salt.  Eat Less Often/Avoid: Salted nuts and seeds, canned beans with added salt. Fats and Sweets (limited)  Eat More Often: Vegetable oils, tub margarines without trans fats, sugar-free gelatin. Mayonnaise and salad dressings.  Eat Less Often/Avoid: Coconut oils, palm oils, butter, stick margarine, cream, half and half, cookies, candy,  pie. FOR MORE INFORMATION The Dash Diet Eating Plan: www.dashdiet.org Document Released: 02/07/2011 Document Revised: 05/13/2011 Document Reviewed: 02/07/2011 [facility] Patient Information 2014 [place], [place]. Diabetes and Exercise Exercising regularly is important. It is not just about losing weight. It has many health benefits, such as:  Improving your overall fitness, flexibility, and endurance.  Increasing your bone density.  Helping with weight control.  Decreasing your body fat.  Increasing your muscle strength.  Reducing stress and tension.  Improving your overall health. People with diabetes who exercise gain additional benefits because exercise:  Reduces appetite.  Improves the body's use of blood sugar (glucose).  Helps lower or control blood glucose.  Decreases blood pressure.  Helps control blood lipids (such as cholesterol and triglycerides).  Improves the body's use of the hormone insulin by:  Increasing the body's insulin sensitivity.  Reducing the body's insulin needs.  Decreases the risk for heart disease because exercising:  Lowers cholesterol and triglycerides levels.  Increases the levels of good cholesterol (such as high-density lipoproteins [HDL]) in the body.  Lowers blood glucose levels. YOUR ACTIVITY PLAN  Choose an activity that you enjoy and set realistic goals. Your health care provider or diabetes educator can help you make an activity plan that works for you. You can break activities into 2 or 3 sessions throughout the day. Doing so is as good as one long session. Exercise ideas include:  Taking the dog for a walk.  Taking the stairs instead of the elevator.  Dancing to your favorite song.  Doing your favorite exercise with a friend. RECOMMENDATIONS FOR EXERCISING WITH TYPE 1 OR TYPE 2 DIABETES   Check your blood glucose before exercising. If blood glucose levels are greater than 240 mg/dL, check for urine ketones. Do not  exercise if  ketones are present.  Avoid injecting insulin into areas of the body that are going to be exercised. For example, avoid injecting insulin into:  The arms when playing tennis.  The legs when jogging.  Keep a record of:  Food intake before and after you exercise.  Expected peak times of insulin action.  Blood glucose levels before and after you exercise.  The type and amount of exercise you have done.  Review your records with your health care provider. Your health care provider will help you to develop guidelines for adjusting food intake and insulin amounts before and after exercising.  If you take insulin or oral hypoglycemic agents, watch for signs and symptoms of hypoglycemia. They include:  Dizziness.  Shaking.  Sweating.  Chills.  Confusion.  Drink plenty of water while you exercise to prevent dehydration or heat stroke. Body water is lost during exercise and must be replaced.  Talk to your health care provider before starting an exercise program to make sure it is safe for you. Remember, almost any type of activity is better than none. Document Released: 05/11/2003 Document Revised: 10/21/2012 Document Reviewed: 07/28/2012 [facility] Patient Information 2014 [place]. Chronic Back Pain  When back pain lasts longer than 3 months, it is called chronic back pain.People with chronic back pain often go through certain periods that are more intense (flare-ups).  CAUSES Chronic back pain can be caused by wear and tear (degeneration) on different structures in your back. These structures include:  The bones of your spine (vertebrae) and the joints surrounding your spinal cord and nerve roots (facets).  The strong, fibrous tissues that connect your vertebrae (ligaments). Degeneration of these structures may result in pressure on your nerves. This can lead to constant pain. HOME CARE INSTRUCTIONS  Avoid bending, heavy lifting, prolonged sitting, and  activities which make the problem worse.  Take brief periods of rest throughout the day to reduce your pain. Lying down or standing usually is better than sitting while you are resting.  Take over-the-counter or prescription medicines only as directed by your caregiver. SEEK IMMEDIATE MEDICAL CARE IF:   You have weakness or numbness in one of your legs or feet.  You have trouble controlling your bladder or bowels.  You have nausea, vomiting, abdominal pain, shortness of breath, or fainting. Document Released: 03/28/2004 Document Revised: 05/13/2011 Document Reviewed: 02/02/2011 [facility] Patient Information 2014 [place], [place].

## 2013-06-01 ENCOUNTER — Ambulatory Visit (INDEPENDENT_AMBULATORY_CARE_PROVIDER_SITE_OTHER): Payer: Managed Care, Other (non HMO) | Admitting: Home Health Services

## 2013-06-01 DIAGNOSIS — E119 Type 2 diabetes mellitus without complications: Secondary | ICD-10-CM

## 2013-06-01 LAB — CERVICOVAGINAL ANCILLARY ONLY
CHLAMYDIA, DNA PROBE: NEGATIVE
Neisseria Gonorrhea: NEGATIVE
TRICH (WINDOWPATH): POSITIVE — AB

## 2013-06-01 LAB — GLUCOSE, POCT (MANUAL RESULT ENTRY): POC Glucose: 218 mg/dl — AB (ref 70–99)

## 2013-06-01 MED ORDER — SODIUM CHLORIDE 0.9 % IJ SOLN: 999.0000 mL | Freq: Once | INTRAMUSCULAR | Status: DC

## 2013-06-01 NOTE — Progress Notes (Signed)
DIABETES Pt came in to have a retinal scan per diabetic care.   Image was taken and submitted to [facility]-DR. Cathren Laine for reading.    Results will be available in 1-2 weeks.  Results will be given to PCP for review and to contact patient.  Vinnie Level

## 2013-06-02 ENCOUNTER — Telehealth: Payer: Self-pay | Admitting: Emergency Medicine

## 2013-06-02 ENCOUNTER — Other Ambulatory Visit: Payer: Self-pay | Admitting: Emergency Medicine

## 2013-06-02 LAB — CERVICOVAGINAL ANCILLARY ONLY
Bacterial vaginitis: POSITIVE — AB
Candida vaginitis: NEGATIVE

## 2013-06-02 MED ORDER — METRONIDAZOLE 500 MG PO TABS: ORAL_TABLET | ORAL | Status: DC

## 2013-06-02 NOTE — Telephone Encounter (Signed)
Pt notified of positive Trichomonas results Pt instructed to take Flagyl 500 mg TIB x 7 dys Also informed partner needs treated Script sent to [facility] Pt verbalized understanding

## 2013-06-09 ENCOUNTER — Telehealth: Payer: Self-pay | Admitting: *Deleted

## 2013-06-09 ENCOUNTER — Telehealth: Payer: Self-pay | Admitting: Internal Medicine

## 2013-06-09 ENCOUNTER — Telehealth: Payer: Self-pay | Admitting: Emergency Medicine

## 2013-06-09 NOTE — Telephone Encounter (Signed)
Cytology report updated and sent to [facility]

## 2013-06-09 NOTE — Telephone Encounter (Signed)
Left message

## 2013-06-09 NOTE — Telephone Encounter (Signed)
Message copied by Joan Mayans on Wed Jun 09, 2013  4:35 PM ------      Message from: Angelica Chessman E      Created: Wed Jun 09, 2013  4:21 PM       Please inform patient that her Pap smear is negative for malignancy ------

## 2013-06-09 NOTE — Telephone Encounter (Signed)
Pt. Would like to know if it would be possible to get a letter for work stating health condition and list of medications..please call pt.

## 2013-06-10 ENCOUNTER — Telehealth: Payer: Self-pay | Admitting: *Deleted

## 2013-06-10 NOTE — Telephone Encounter (Signed)
Message copied by Joan Mayans on Thu Jun 10, 2013  3:18 PM ------      Message from: Joy Solis E      Created: Wed Jun 09, 2013  4:21 PM       Please inform patient that her Pap smear is negative for malignancy ------

## 2013-06-10 NOTE — Telephone Encounter (Signed)
Pt is aware of her Pap results. 

## 2013-06-14 ENCOUNTER — Other Ambulatory Visit: Payer: Managed Care, Other (non HMO)

## 2013-06-30 ENCOUNTER — Encounter: Payer: Self-pay | Admitting: Podiatry

## 2013-06-30 ENCOUNTER — Ambulatory Visit (INDEPENDENT_AMBULATORY_CARE_PROVIDER_SITE_OTHER): Payer: Managed Care, Other (non HMO) | Admitting: Podiatry

## 2013-06-30 VITALS — BP 132/81 | HR 80 | Resp 18 | Ht 60.0 in | Wt 198.0 lb

## 2013-06-30 DIAGNOSIS — M79609 Pain in unspecified limb: Secondary | ICD-10-CM

## 2013-06-30 DIAGNOSIS — B351 Tinea unguium: Secondary | ICD-10-CM

## 2013-06-30 NOTE — Patient Instructions (Signed)
Diabetes and Foot Care Diabetes may cause you to have problems because of poor blood supply (circulation) to your feet and legs. This may cause the skin on your feet to become thinner, break easier, and heal more slowly. Your skin may become dry, and the skin may peel and crack. You may also have nerve damage in your legs and feet causing decreased feeling in them. You may not notice minor injuries to your feet that could lead to infections or more serious problems. Taking care of your feet is one of the most important things you can do for yourself.  HOME CARE INSTRUCTIONS  Wear shoes at all times, even in the house. Do not go barefoot. Bare feet are easily injured.  Check your feet daily for blisters, cuts, and redness. If you cannot see the bottom of your feet, use a mirror or ask someone for help.  Wash your feet with warm water (do not use hot water) and mild soap. Then pat your feet and the areas between your toes until they are completely dry. Do not soak your feet as this can dry your skin.  Apply a moisturizing lotion or petroleum jelly (that does not contain alcohol and is unscented) to the skin on your feet and to dry, brittle toenails. Do not apply lotion between your toes.  Trim your toenails straight across. Do not dig under them or around the cuticle. File the edges of your nails with an emery board or nail file.  Do not cut corns or calluses or try to remove them with medicine.  Wear clean socks or stockings every day. Make sure they are not too tight. Do not wear knee-high stockings since they may decrease blood flow to your legs.  Wear shoes that fit properly and have enough cushioning. To break in new shoes, wear them for just a few hours a day. This prevents you from injuring your feet. Always look in your shoes before you put them on to be sure there are no objects inside.  Do not cross your legs. This may decrease the blood flow to your feet.  If you find a minor scrape,  cut, or break in the skin on your feet, keep it and the skin around it clean and dry. These areas may be cleansed with mild soap and water. Do not cleanse the area with peroxide, alcohol, or iodine.  When you remove an adhesive bandage, be sure not to damage the skin around it.  If you have a wound, look at it several times a day to make sure it is healing.  Do not use heating pads or hot water bottles. They may burn your skin. If you have lost feeling in your feet or legs, you may not know it is happening until it is too late.  Make sure your health care provider performs a complete foot exam at least annually or more often if you have foot problems. Report any cuts, sores, or bruises to your health care provider immediately. SEEK MEDICAL CARE IF:   You have an injury that is not healing.  You have cuts or breaks in the skin.  You have an ingrown nail.  You notice redness on your legs or feet.  You feel burning or tingling in your legs or feet.  You have pain or cramps in your legs and feet.  Your legs or feet are numb.  Your feet always feel cold. SEEK IMMEDIATE MEDICAL CARE IF:   There is increasing redness,   swelling, or pain in or around a wound.  There is a red line that goes up your leg.  Pus is coming from a wound.  You develop a fever or as directed by your health care provider.  You notice a bad smell coming from an ulcer or wound. Document Released: 02/16/2000 Document Revised: 10/21/2012 Document Reviewed: 07/28/2012 [employer] Patient Information 2014 [employer].  

## 2013-06-30 NOTE — Progress Notes (Signed)
   Subjective:    Patient ID: Joy Solis, female    DOB: April 12, 1965, 48 y.o.   MRN: [id number]  HPI Comments: Pt states she was referred by Dr. Doreene Burke for diabetic foot care, and debridement of 1 - 10 toenails.  Pt states her 2 big toenails are discolored.  Diabetes      Review of Systems  Musculoskeletal: Positive for back pain.       Lumbar spinal stenosis, pt states she was injected in the lower back on 04 /28/2015.  Neurological:       Trigeminal neuralgia on right side of the face  All other systems reviewed and are negative.      Objective:   Physical Exam  Orientated x63 48 year old black female  Vascular: DP pulses 2/4 bilaterally PT pulses 2/4 bilaterally Capillary fill is immediate bilaterally  Neurological: Sensation to 10 g monofilament wire intact 5/5 bilaterally Vibratory sensation intact bilaterally Ankle reflexes reactive bilaterally  Dermatological: Elongated, hypertrophic, discolored toenails x10  Musculoskeletal: Patient uses cane or walker to accommodate back and knee pain. HAV deformities bilaterally Medium arch contour without any restriction of ankle, subtalar midtarsal joints bilaterally          Assessment & Plan:   Assessment: Satisfactory neurovascular status Symptomatic onychomycoses x10  Plan: Nails x10 are debrided without any bleeding Reappoint at three-month intervals

## 2013-07-01 ENCOUNTER — Encounter: Payer: Self-pay | Admitting: Podiatry

## 2013-07-09 ENCOUNTER — Other Ambulatory Visit: Payer: Self-pay | Admitting: Internal Medicine

## 2013-07-12 ENCOUNTER — Telehealth: Payer: Self-pay | Admitting: Internal Medicine

## 2013-07-12 NOTE — Telephone Encounter (Signed)
Pt has come in today to discuss with a nurse her instructions for taking tramadol; please f/u with pt [phone]

## 2013-07-13 NOTE — Telephone Encounter (Signed)
I spoke to the pt and spoke to her about the dosages of her tramadol. Pt has been overdosing and I explained to her that the rx did say PRN but she needed to try taking the medications as prescribed. PT has enough tramadol to last her to her next appointment. I told her to talk to the doctor about an increase of dosage and a referral to a pain clinic.

## 2013-08-12 ENCOUNTER — Encounter: Payer: Self-pay | Admitting: Internal Medicine

## 2013-08-12 ENCOUNTER — Ambulatory Visit: Payer: Managed Care, Other (non HMO) | Attending: Internal Medicine | Admitting: Internal Medicine

## 2013-08-12 VITALS — BP 123/81 | HR 85 | Temp 99.2°F | Resp 16 | Ht 60.0 in | Wt 194.0 lb

## 2013-08-12 DIAGNOSIS — E1310 Other specified diabetes mellitus with ketoacidosis without coma: Secondary | ICD-10-CM | POA: Insufficient documentation

## 2013-08-12 DIAGNOSIS — E1110 Type 2 diabetes mellitus with ketoacidosis without coma: Secondary | ICD-10-CM

## 2013-08-12 DIAGNOSIS — M48061 Spinal stenosis, lumbar region without neurogenic claudication: Secondary | ICD-10-CM | POA: Insufficient documentation

## 2013-08-12 DIAGNOSIS — Z7982 Long term (current) use of aspirin: Secondary | ICD-10-CM | POA: Insufficient documentation

## 2013-08-12 DIAGNOSIS — M1990 Unspecified osteoarthritis, unspecified site: Secondary | ICD-10-CM | POA: Insufficient documentation

## 2013-08-12 DIAGNOSIS — E119 Type 2 diabetes mellitus without complications: Secondary | ICD-10-CM

## 2013-08-12 DIAGNOSIS — I10 Essential (primary) hypertension: Secondary | ICD-10-CM | POA: Insufficient documentation

## 2013-08-12 DIAGNOSIS — G500 Trigeminal neuralgia: Secondary | ICD-10-CM | POA: Insufficient documentation

## 2013-08-12 DIAGNOSIS — E785 Hyperlipidemia, unspecified: Secondary | ICD-10-CM | POA: Insufficient documentation

## 2013-08-12 LAB — GLUCOSE, POCT (MANUAL RESULT ENTRY): POC Glucose: 155 mg/dl — AB (ref 70–99)

## 2013-08-12 LAB — POCT GLYCOSYLATED HEMOGLOBIN (HGB A1C): HEMOGLOBIN A1C: 8.2

## 2013-08-12 MED ORDER — CARBAMAZEPINE ER 100 MG PO TB12: 100.0000 mg | ORAL_TABLET | Freq: Two times a day (BID) | ORAL | Status: DC

## 2013-08-12 MED ORDER — GLIPIZIDE 5 MG PO TABS: 5.0000 mg | ORAL_TABLET | Freq: Two times a day (BID) | ORAL | Status: DC

## 2013-08-12 MED ORDER — GUAIFENESIN-DM 100-10 MG/5ML PO SYRP: 5.0000 mL | ORAL_SOLUTION | ORAL | Status: DC | PRN

## 2013-08-12 NOTE — Progress Notes (Signed)
Pt is here today following up on her diabetes and HTN. Pt states that her jaw is very painful making it difficult to talk, eat and drink.

## 2013-08-12 NOTE — Patient Instructions (Signed)
Trigeminal Neuralgia Trigeminal neuralgia is a nerve disorder that causes sudden attacks of severe facial pain. It is caused by damage to the trigeminal nerve, a major nerve in the face. It is more common in women and in the elderly, although it can also happen in younger patients. Attacks last from a few seconds to several minutes and can occur from a couple of times per year to several times per day. Trigeminal neuralgia can be a very distressing and disabling condition. Surgery may be needed in very severe cases if medical treatment does not give relief. HOME CARE INSTRUCTIONS   If your caregiver prescribed medication to help prevent attacks, take as directed.  To help prevent attacks:  Chew on the unaffected side of the mouth.  Avoid touching your face.  Avoid blasts of hot or cold air.  Men may wish to grow a beard to avoid having to shave. SEEK IMMEDIATE MEDICAL CARE IF:  Pain is unbearable and your medicine does not help.  You develop new, unexplained symptoms (problems).  You have problems that may be related to a medication you are taking. Document Released: 02/16/2000 Document Revised: 05/13/2011 Document Reviewed: 12/16/2008 [facility] Patient Information 2014 [place], [place]. Hypertension As your heart beats, it forces blood through your arteries. This force is your blood pressure. If the pressure is too high, it is called hypertension (HTN) or high blood pressure. HTN is dangerous because you may have it and not know it. High blood pressure may mean that your heart has to work harder to pump blood. Your arteries may be narrow or stiff. The extra work puts you at risk for heart disease, stroke, and other problems.  Blood pressure consists of two numbers, a higher number over a lower, 110/72, for example. It is stated as "110 over 72." The ideal is below 120 for the top number (systolic) and under 80 for the bottom (diastolic). Write down your blood pressure today. You should pay  close attention to your blood pressure if you have certain conditions such as:  Heart failure.  Prior heart attack.  Diabetes  Chronic kidney disease.  Prior stroke.  Multiple risk factors for heart disease. To see if you have HTN, your blood pressure should be measured while you are seated with your arm held at the level of the heart. It should be measured at least twice. A one-time elevated blood pressure reading (especially in the Emergency Department) does not mean that you need treatment. There may be conditions in which the blood pressure is different between your right and left arms. It is important to see your caregiver soon for a recheck. Most people have essential hypertension which means that there is not a specific cause. This type of high blood pressure may be lowered by changing lifestyle factors such as:  Stress.  Smoking.  Lack of exercise.  Excessive weight.  Drug/tobacco/alcohol use.  Eating less salt. Most people do not have symptoms from high blood pressure until it has caused damage to the body. Effective treatment can often prevent, delay or reduce that damage. TREATMENT  When a cause has been identified, treatment for high blood pressure is directed at the cause. There are a large number of medications to treat HTN. These fall into several categories, and your caregiver will help you select the medicines that are best for you. Medications may have side effects. You should review side effects with your caregiver. If your blood pressure stays high after you have made lifestyle changes or started  on medicines,   Your medication(s) may need to be changed.  Other problems may need to be addressed.  Be certain you understand your prescriptions, and know how and when to take your medicine.  Be sure to follow up with your caregiver within the time frame advised (usually within two weeks) to have your blood pressure rechecked and to review your medications.  If  you are taking more than one medicine to lower your blood pressure, make sure you know how and at what times they should be taken. Taking two medicines at the same time can result in blood pressure that is too low. SEEK IMMEDIATE MEDICAL CARE IF:  You develop a severe headache, blurred or changing vision, or confusion.  You have unusual weakness or numbness, or a faint feeling.  You have severe chest or abdominal pain, vomiting, or breathing problems. MAKE SURE YOU:   Understand these instructions.  Will watch your condition.  Will get help right away if you are not doing well or get worse. Document Released: 02/18/2005 Document Revised: 05/13/2011 Document Reviewed: 10/09/2007 [facility] Patient Information 2014 [place]. Diabetes and Exercise Exercising regularly is important. It is not just about losing weight. It has many health benefits, such as:  Improving your overall fitness, flexibility, and endurance.  Increasing your bone density.  Helping with weight control.  Decreasing your body fat.  Increasing your muscle strength.  Reducing stress and tension.  Improving your overall health. People with diabetes who exercise gain additional benefits because exercise:  Reduces appetite.  Improves the body's use of blood sugar (glucose).  Helps lower or control blood glucose.  Decreases blood pressure.  Helps control blood lipids (such as cholesterol and triglycerides).  Improves the body's use of the hormone insulin by:  Increasing the body's insulin sensitivity.  Reducing the body's insulin needs.  Decreases the risk for heart disease because exercising:  Lowers cholesterol and triglycerides levels.  Increases the levels of good cholesterol (such as high-density lipoproteins [HDL]) in the body.  Lowers blood glucose levels. YOUR ACTIVITY PLAN  Choose an activity that you enjoy and set realistic goals. Your health care provider or diabetes educator can  help you make an activity plan that works for you. You can break activities into 2 or 3 sessions throughout the day. Doing so is as good as one long session. Exercise ideas include:  Taking the dog for a walk.  Taking the stairs instead of the elevator.  Dancing to your favorite song.  Doing your favorite exercise with a friend. RECOMMENDATIONS FOR EXERCISING WITH TYPE 1 OR TYPE 2 DIABETES   Check your blood glucose before exercising. If blood glucose levels are greater than 240 mg/dL, check for urine ketones. Do not exercise if ketones are present.  Avoid injecting insulin into areas of the body that are going to be exercised. For example, avoid injecting insulin into:  The arms when playing tennis.  The legs when jogging.  Keep a record of:  Food intake before and after you exercise.  Expected peak times of insulin action.  Blood glucose levels before and after you exercise.  The type and amount of exercise you have done.  Review your records with your health care provider. Your health care provider will help you to develop guidelines for adjusting food intake and insulin amounts before and after exercising.  If you take insulin or oral hypoglycemic agents, watch for signs and symptoms of hypoglycemia. They include:  Dizziness.  Shaking.  Sweating.  Chills.  Confusion.  Drink plenty of water while you exercise to prevent dehydration or heat stroke. Body water is lost during exercise and must be replaced.  Talk to your health care provider before starting an exercise program to make sure it is safe for you. Remember, almost any type of activity is better than none. Document Released: 05/11/2003 Document Revised: 10/21/2012 Document Reviewed: 07/28/2012 [facility] Patient Information 2014 [place].

## 2013-09-22 ENCOUNTER — Ambulatory Visit: Payer: Managed Care, Other (non HMO)

## 2013-09-22 VITALS — BP 137/85 | HR 73 | Temp 98.5°F | Resp 14

## 2013-09-22 MED ORDER — GABAPENTIN 400 MG PO CAPS: 400.0000 mg | ORAL_CAPSULE | Freq: Three times a day (TID) | ORAL | Status: DC

## 2013-09-22 MED ORDER — PREDNISONE 10 MG PO TABS: 10.0000 mg | ORAL_TABLET | Freq: Every day | ORAL | Status: DC

## 2013-09-22 NOTE — Progress Notes (Unsigned)
Patient presents to walk in clinic with increased pain from trigeminal neuralgia. Patient states she is having muscle spasms also. Patient was prescribed Tegretol on 08/12/2013 for this also. Patient states she can not afford this medication. Spoke with Dr. Doreene Burke via telephone verbal order received to increase Gabapentin to 400 mg three times a day as well as Prednisone 10 mg daily for five days. Informed patient of plan of care. Patient verbalized understanding. Vivia Birmingham, RN

## 2013-09-22 NOTE — Patient Instructions (Signed)
Trigeminal Neuralgia  Trigeminal neuralgia is a nerve disorder that causes sudden attacks of severe facial pain. It is caused by damage to the trigeminal nerve, a major nerve in the face. It is more common in women and in the elderly, although it can also happen in younger patients. Attacks last from a few seconds to several minutes and can occur from a couple of times per year to several times per day. Trigeminal neuralgia can be a very distressing and disabling condition. Surgery may be needed in very severe cases if medical treatment does not give relief.  HOME CARE INSTRUCTIONS    If your caregiver prescribed medication to help prevent attacks, take as directed.   To help prevent attacks:   Chew on the unaffected side of the mouth.   Avoid touching your face.   Avoid blasts of hot or cold air.   Men may wish to grow a beard to avoid having to shave.  SEEK IMMEDIATE MEDICAL CARE IF:   Pain is unbearable and your medicine does not help.   You develop new, unexplained symptoms (problems).   You have problems that may be related to a medication you are taking.  Document Released: 02/16/2000 Document Revised: 05/13/2011 Document Reviewed: 12/16/2008  [employer] Patient Information 2015 [employer]. This information is not intended to replace advice given to you by your health care provider. Make sure you discuss any questions you have with your health care provider.

## 2013-09-27 ENCOUNTER — Encounter (HOSPITAL_COMMUNITY): Payer: Self-pay | Admitting: Emergency Medicine

## 2013-09-27 ENCOUNTER — Emergency Department (INDEPENDENT_AMBULATORY_CARE_PROVIDER_SITE_OTHER)
Admission: EM | Admit: 2013-09-27 | Discharge: 2013-09-27 | Disposition: A | Discharge status: Home / Self Care | Payer: Managed Care, Other (non HMO) | Source: Home / Self Care | Attending: Family Medicine | Admitting: Family Medicine

## 2013-09-27 DIAGNOSIS — M5431 Sciatica, right side: Secondary | ICD-10-CM

## 2013-09-27 DIAGNOSIS — M5430 Sciatica, unspecified side: Secondary | ICD-10-CM

## 2013-09-27 MED ORDER — HYDROCODONE-ACETAMINOPHEN 5-325 MG PO TABS: 1.0000 | ORAL_TABLET | Freq: Four times a day (QID) | ORAL | Status: DC | PRN

## 2013-09-27 NOTE — ED Notes (Signed)
Pain in multiple areas, not controlled by routine from Dr Doreene Burke or Dr Jeanie Cooks

## 2013-09-27 NOTE — ED Provider Notes (Signed)
CSN: [id number]     Arrival date & time 09/27/13  1528 History   First MD Initiated Contact with Patient 09/27/13 1609     Chief Complaint  Patient presents with  . Joint Pain   (Consider location/radiation/quality/duration/timing/severity/associated sxs/prior Treatment) HPI Comments: Patient presents with acute exacerbation of chronic back pain. States she is already recently had her gabapentin increased and was also recently prescribed a prednisone taper by her PCP at [facility]. She states she is still experiencing severe right lower back pain with radiculopathy to right buttock. Denies bowel or bladder incontinence or perineal anesthesia. Denies fever. 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  Works in Ambulance person at [employer] and states she has been working more over the past few weeks and thinks this may have triggered additional back pain Denies GI or GU sx.   The history is provided by the patient.    Past Medical History  Diagnosis Date  . Hypertension   . Diabetes mellitus   . Environmental allergies   . Spinal stenosis of lumbar region     pt was injected on 06/29/2013  . Hyperlipidemia   . Arthritis    Past Surgical History  Procedure Laterality Date  . Knee surgery    . Hand surgery    . Ankle surgery    . Foot surgery     Family History  Problem Relation Age of Onset  . Heart disease Mother   . Diabetes Mother   . Glaucoma Maternal Grandmother    History  Substance Use Topics  . Smoking status: Current Every Day Smoker -- 1.00 packs/day  . Smokeless tobacco: Not on file  . Alcohol Use: No   OB History   Grav Para Term Preterm Abortions TAB SAB Ect Mult Living                 Review of Systems  All other systems reviewed and are negative.   Allergies  Review of patient's allergies indicates no known allergies.  Home Medications   Prior to Admission medications   Medication Sig Start Date End Date Taking? Authorizing Provider  acetaminophen-codeine  (TYLENOL #3) 300-30 MG per tablet Take 1 tablet by mouth every 4 (four) hours as needed for moderate pain. 05/13/13   Theodis Blaze, MD  aspirin 81 MG tablet Take 81 mg by mouth daily.    Historical Provider, MD  atorvastatin (LIPITOR) 20 MG tablet Take 20 mg by mouth daily.    Historical Provider, MD  carbamazepine (TEGRETOL-XR) 100 MG 12 hr tablet Take 1 tablet (100 mg total) by mouth 2 (two) times daily. 08/12/13   Angelica Chessman, MD  carvedilol (COREG) 6.25 MG tablet Take 1 tablet (6.25 mg total) by mouth 2 (two) times daily with a meal. 02/02/13   Thurnell Lose, MD  Dapagliflozin Propanediol (FARXIGA) 10 MG TABS Take 10 mg by mouth daily. 05/31/13   Angelica Chessman, MD  DULoxetine (CYMBALTA) 30 MG capsule Take 1 capsule (30 mg total) by mouth daily. 10/12/12   Angelica Chessman, MD  fexofenadine (ALLEGRA) 180 MG tablet Take 1 tablet (180 mg total) by mouth daily. 08/03/12   Janne Napoleon, NP  fish oil-omega-3 fatty acids 1000 MG capsule Take 2 g by mouth daily.    Historical Provider, MD  fluconazole (DIFLUCAN) 150 MG tablet Take 1 tablet (150 mg total) by mouth once. 05/13/13   Theodis Blaze, MD  gabapentin (NEURONTIN) 400 MG capsule Take 1 capsule (400 mg total) by mouth 3 (three)  times daily. 09/22/13   Angelica Chessman, MD  glipiZIDE (GLUCOTROL) 5 MG tablet Take 1 tablet (5 mg total) by mouth 2 (two) times daily before a meal. 08/12/13   Angelica Chessman, MD  glucose monitoring kit (FREESTYLE) monitoring kit 1 each by Does not apply route 4 (four) times daily - after meals and at bedtime. 1 month Diabetic Testing Supplies for QAC-QHS accuchecks. Any manufacturer is acceptable. 04/09/13   Reyne Dumas, MD  guaiFENesin-dextromethorphan (ROBITUSSIN DM) 100-10 MG/5ML syrup Take 5 mLs by mouth every 4 (four) hours as needed for cough. 08/12/13   Angelica Chessman, MD  hydrochlorothiazide (HYDRODIURIL) 25 MG tablet Take 1 tablet (25 mg total) by mouth daily. 04/09/13   Reyne Dumas, MD    HYDROcodone-acetaminophen (NORCO/VICODIN) 5-325 MG per tablet Take 1-2 tablets by mouth every 6 (six) hours as needed for moderate pain or severe pain. 09/27/13   Lahoma Rocker, PA  insulin NPH (RELION N) 100 UNIT/ML injection Inject 15 Units into the skin at bedtime. 08/26/12   Clanford Marisa Hua, MD  Insulin Syringe-Needle U-100 (RELION INSULIN SYR .3CC/29G) 29G X 1/2" 0.3 ML MISC 1 Syringe by Does not apply route as directed. 08/26/12   Clanford Marisa Hua, MD  ketoconazole (NIZORAL) 2 % cream Apply topically daily. 08/12/12   Clanford Marisa Hua, MD  lisinopril (PRINIVIL,ZESTRIL) 5 MG tablet Take 1 tablet (5 mg total) by mouth daily. 02/02/13   Thurnell Lose, MD  metFORMIN (GLUCOPHAGE) 1000 MG tablet Take 1 tablet (1,000 mg total) by mouth 2 (two) times daily with a meal. 05/13/13   Theodis Blaze, MD  PARoxetine (PAXIL) 20 MG tablet Take 20 mg by mouth every morning.    Historical Provider, MD  predniSONE (DELTASONE) 10 MG tablet Take 1 tablet (10 mg total) by mouth daily with breakfast. 09/22/13   Angelica Chessman, MD  traMADol (ULTRAM) 50 MG tablet Take 1 tablet (50 mg total) by mouth every 12 (twelve) hours as needed. 05/31/13   Angelica Chessman, MD   BP 129/78  Pulse 81  Temp(Src) 98.7 F (37.1 C) (Oral)  Resp 20  Ht 5' (1.524 m)  Wt 194 lb (87.998 kg)  BMI 37.89 kg/m2  SpO2 97% Physical Exam  Nursing note and vitals reviewed. Constitutional: She is oriented to person, place, and time. She appears well-developed and well-nourished. No distress.  +overweight  HENT:  Head: Normocephalic and atraumatic.  Eyes: Conjunctivae are normal. No scleral icterus.  Cardiovascular: Normal rate, regular rhythm and normal heart sounds.   Pulmonary/Chest: Effort normal and breath sounds normal.  Abdominal: Soft. Bowel sounds are normal. She exhibits no distension. There is no tenderness.  Musculoskeletal: Normal range of motion.       Back:  Outlined area is are of tenderness with movement  and palpation CSM exam is without focal abnormality. Patient has baseline foot right foot neuropathy  Neurological: She is alert and oriented to person, place, and time.  Skin: Skin is warm and dry. No rash noted. No erythema.  Psychiatric: Her behavior is normal.    ED Course  Procedures (including critical care time) Labs Review Labs Reviewed - No data to display  Imaging Review No results found.   MDM   1. Sciatica neuralgia, right    Attempted to access information from [place] Controlled Substances website with regard to this patient, however, site appears to currently be non-functional. Will provide patient with limited Rx for Vicodin $RemoveBef'5mg'onjxoDuJur$ /$Remov'325mg'eZdiDM$  (#12, no refills) and advise follow up with either her  PCP at [facility] or her orthopedist (Dr. French Ana) for further management.     [place], [place] 09/27/13 [phone]

## 2013-09-27 NOTE — Discharge Instructions (Signed)
Sciatica Sciatica is pain, weakness, numbness, or tingling along the path of the sciatic nerve. The nerve starts in the lower back and runs down the back of each leg. The nerve controls the muscles in the lower leg and in the back of the knee, while also providing sensation to the back of the thigh, lower leg, and the sole of your foot. Sciatica is a symptom of another medical condition. For instance, nerve damage or certain conditions, such as a herniated disk or bone spur on the spine, pinch or put pressure on the sciatic nerve. This causes the pain, weakness, or other sensations normally associated with sciatica. Generally, sciatica only affects one side of the body. CAUSES   Herniated or slipped disc.  Degenerative disk disease.  A pain disorder involving the narrow muscle in the buttocks (piriformis syndrome).  Pelvic injury or fracture.  Pregnancy.  Tumor (rare). SYMPTOMS  Symptoms can vary from mild to very severe. The symptoms usually travel from the low back to the buttocks and down the back of the leg. Symptoms can include:  Mild tingling or dull aches in the lower back, leg, or hip.  Numbness in the back of the calf or sole of the foot.  Burning sensations in the lower back, leg, or hip.  Sharp pains in the lower back, leg, or hip.  Leg weakness.  Severe back pain inhibiting movement. These symptoms may get worse with coughing, sneezing, laughing, or prolonged sitting or standing. Also, being overweight may worsen symptoms. DIAGNOSIS  Your caregiver will perform a physical exam to look for common symptoms of sciatica. He or she may ask you to do certain movements or activities that would trigger sciatic nerve pain. Other tests may be performed to find the cause of the sciatica. These may include:  Blood tests.  X-rays.  Imaging tests, such as an MRI or CT scan. TREATMENT  Treatment is directed at the cause of the sciatic pain. Sometimes, treatment is not necessary  and the pain and discomfort goes away on its own. If treatment is needed, your caregiver may suggest:  Over-the-counter medicines to relieve pain.  Prescription medicines, such as anti-inflammatory medicine, muscle relaxants, or narcotics.  Applying heat or ice to the painful area.  Steroid injections to lessen pain, irritation, and inflammation around the nerve.  Reducing activity during periods of pain.  Exercising and stretching to strengthen your abdomen and improve flexibility of your spine. Your caregiver may suggest losing weight if the extra weight makes the back pain worse.  Physical therapy.  Surgery to eliminate what is pressing or pinching the nerve, such as a bone spur or part of a herniated disk. HOME CARE INSTRUCTIONS   Only take over-the-counter or prescription medicines for pain or discomfort as directed by your caregiver.  Apply ice to the affected area for 20 minutes, 3-4 times a day for the first 48-72 hours. Then try heat in the same way.  Exercise, stretch, or perform your usual activities if these do not aggravate your pain.  Attend physical therapy sessions as directed by your caregiver.  Keep all follow-up appointments as directed by your caregiver.  Do not wear high heels or shoes that do not provide proper support.  Check your mattress to see if it is too soft. A firm mattress may lessen your pain and discomfort. SEEK IMMEDIATE MEDICAL CARE IF:   You lose control of your bowel or bladder (incontinence).  You have increasing weakness in the lower back, pelvis, buttocks,   or legs.  You have redness or swelling of your back.  You have a burning sensation when you urinate.  You have pain that gets worse when you lie down or awakens you at night.  Your pain is worse than you have experienced in the past.  Your pain is lasting longer than 4 weeks.  You are suddenly losing weight without reason. MAKE SURE YOU:  Understand these  instructions.  Will watch your condition.  Will get help right away if you are not doing well or get worse. Document Released: 02/12/2001 Document Revised: 08/20/2011 Document Reviewed: 06/30/2011 [employer] Patient Information 2015 [employer]. This information is not intended to replace advice given to you by your health care provider. Make sure you discuss any questions you have with your health care provider.  

## 2013-09-28 NOTE — ED Provider Notes (Signed)
Medical screening examination/treatment/procedure(s) were performed by resident physician or non-physician practitioner and as supervising physician I was immediately available for consultation/collaboration.   Pauline Good MD.   Billy Fischer, MD 09/28/13 [phone]

## 2013-09-29 ENCOUNTER — Ambulatory Visit: Payer: Managed Care, Other (non HMO) | Admitting: Podiatry

## 2013-09-30 ENCOUNTER — Other Ambulatory Visit: Payer: Self-pay | Admitting: Internal Medicine

## 2013-09-30 DIAGNOSIS — E1165 Type 2 diabetes mellitus with hyperglycemia: Principal | ICD-10-CM

## 2013-09-30 DIAGNOSIS — IMO0001 Reserved for inherently not codable concepts without codable children: Secondary | ICD-10-CM

## 2013-10-18 ENCOUNTER — Other Ambulatory Visit: Payer: Self-pay | Admitting: Internal Medicine

## 2013-10-18 DIAGNOSIS — E1165 Type 2 diabetes mellitus with hyperglycemia: Principal | ICD-10-CM

## 2013-10-18 DIAGNOSIS — IMO0001 Reserved for inherently not codable concepts without codable children: Secondary | ICD-10-CM

## 2013-10-24 ENCOUNTER — Emergency Department (HOSPITAL_COMMUNITY): Payer: Managed Care, Other (non HMO)

## 2013-10-24 ENCOUNTER — Emergency Department (HOSPITAL_COMMUNITY)
Admission: EM | Admit: 2013-10-24 | Discharge: 2013-10-24 | Disposition: A | Discharge status: Home / Self Care | Payer: Managed Care, Other (non HMO) | Attending: Emergency Medicine | Admitting: Emergency Medicine

## 2013-10-24 ENCOUNTER — Encounter (HOSPITAL_COMMUNITY): Payer: Self-pay | Admitting: Emergency Medicine

## 2013-10-24 DIAGNOSIS — Y9389 Activity, other specified: Secondary | ICD-10-CM | POA: Insufficient documentation

## 2013-10-24 DIAGNOSIS — Z794 Long term (current) use of insulin: Secondary | ICD-10-CM | POA: Diagnosis not present

## 2013-10-24 DIAGNOSIS — S4980XA Other specified injuries of shoulder and upper arm, unspecified arm, initial encounter: Secondary | ICD-10-CM | POA: Insufficient documentation

## 2013-10-24 DIAGNOSIS — S3981XA Other specified injuries of abdomen, initial encounter: Secondary | ICD-10-CM | POA: Insufficient documentation

## 2013-10-24 DIAGNOSIS — I10 Essential (primary) hypertension: Secondary | ICD-10-CM | POA: Diagnosis not present

## 2013-10-24 DIAGNOSIS — E119 Type 2 diabetes mellitus without complications: Secondary | ICD-10-CM | POA: Diagnosis not present

## 2013-10-24 DIAGNOSIS — Z7982 Long term (current) use of aspirin: Secondary | ICD-10-CM | POA: Diagnosis not present

## 2013-10-24 DIAGNOSIS — M129 Arthropathy, unspecified: Secondary | ICD-10-CM | POA: Diagnosis not present

## 2013-10-24 DIAGNOSIS — E785 Hyperlipidemia, unspecified: Secondary | ICD-10-CM | POA: Insufficient documentation

## 2013-10-24 DIAGNOSIS — S298XXA Other specified injuries of thorax, initial encounter: Secondary | ICD-10-CM | POA: Diagnosis present

## 2013-10-24 DIAGNOSIS — Y92410 Unspecified street and highway as the place of occurrence of the external cause: Secondary | ICD-10-CM | POA: Insufficient documentation

## 2013-10-24 DIAGNOSIS — Z79899 Other long term (current) drug therapy: Secondary | ICD-10-CM | POA: Diagnosis not present

## 2013-10-24 DIAGNOSIS — IMO0002 Reserved for concepts with insufficient information to code with codable children: Secondary | ICD-10-CM | POA: Diagnosis not present

## 2013-10-24 DIAGNOSIS — F17200 Nicotine dependence, unspecified, uncomplicated: Secondary | ICD-10-CM | POA: Diagnosis not present

## 2013-10-24 DIAGNOSIS — S199XXA Unspecified injury of neck, initial encounter: Secondary | ICD-10-CM

## 2013-10-24 DIAGNOSIS — S20219A Contusion of unspecified front wall of thorax, initial encounter: Secondary | ICD-10-CM | POA: Diagnosis not present

## 2013-10-24 DIAGNOSIS — S0993XA Unspecified injury of face, initial encounter: Secondary | ICD-10-CM | POA: Insufficient documentation

## 2013-10-24 DIAGNOSIS — S46909A Unspecified injury of unspecified muscle, fascia and tendon at shoulder and upper arm level, unspecified arm, initial encounter: Secondary | ICD-10-CM | POA: Insufficient documentation

## 2013-10-24 LAB — I-STAT CHEM 8, ED
BUN: 22 mg/dL (ref 6–23)
Calcium, Ion: 1.11 mmol/L — ABNORMAL LOW (ref 1.12–1.23)
Chloride: 106 mEq/L (ref 96–112)
Creatinine, Ser: 0.8 mg/dL (ref 0.50–1.10)
Glucose, Bld: 190 mg/dL — ABNORMAL HIGH (ref 70–99)
HCT: 40 % (ref 36.0–46.0)
HEMOGLOBIN: 13.6 g/dL (ref 12.0–15.0)
Potassium: 3.5 mEq/L — ABNORMAL LOW (ref 3.7–5.3)
SODIUM: 138 meq/L (ref 137–147)
TCO2: 29 mmol/L (ref 0–100)

## 2013-10-24 MED ORDER — CYCLOBENZAPRINE HCL 10 MG PO TABS: 10.0000 mg | ORAL_TABLET | Freq: Two times a day (BID) | ORAL | Status: DC | PRN

## 2013-10-24 MED ORDER — HYDROCODONE-ACETAMINOPHEN 5-325 MG PO TABS: 1.0000 | ORAL_TABLET | ORAL | Status: DC | PRN

## 2013-10-24 MED ORDER — FENTANYL CITRATE 0.05 MG/ML IJ SOLN
50.0000 ug | Freq: Once | INTRAMUSCULAR | Status: AC
  Administered 2013-10-24: 50 ug via INTRAVENOUS
  Filled 2013-10-24: qty 2

## 2013-10-24 MED ORDER — NAPROXEN 375 MG PO TABS: 375.0000 mg | ORAL_TABLET | Freq: Two times a day (BID) | ORAL | Status: DC

## 2013-10-24 MED ORDER — SODIUM CHLORIDE 0.9 % IV SOLN
INTRAVENOUS | Status: DC
  Administered 2013-10-24: 15:00:00 via INTRAVENOUS

## 2013-10-24 MED ORDER — IOHEXOL 300 MG/ML  SOLN
100.0000 mL | Freq: Once | INTRAMUSCULAR | Status: AC | PRN
  Administered 2013-10-24: 100 mL via INTRAVENOUS
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
## 2013-10-24 NOTE — ED Provider Notes (Signed)
Medical screening examination/treatment/procedure(s) were performed by non-physician practitioner and as supervising physician I was immediately available for consultation/collaboration.   EKG Interpretation None       Threasa Beards, MD 10/24/13 [phone]
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
## 2013-10-24 NOTE — ED Notes (Signed)
Patient transported to X-ray 

## 2013-10-24 NOTE — ED Notes (Signed)
Patient transported to CT 

## 2013-11-15 ENCOUNTER — Other Ambulatory Visit (HOSPITAL_COMMUNITY): Payer: Self-pay | Admitting: Neurosurgery

## 2013-11-15 DIAGNOSIS — M47817 Spondylosis without myelopathy or radiculopathy, lumbosacral region: Secondary | ICD-10-CM

## 2013-11-16 ENCOUNTER — Ambulatory Visit: Payer: Private Health Insurance - Indemnity | Attending: Internal Medicine | Admitting: Internal Medicine

## 2013-11-16 ENCOUNTER — Encounter: Payer: Self-pay | Admitting: Internal Medicine

## 2013-11-16 VITALS — BP 131/85 | HR 83 | Temp 99.6°F | Resp 16 | Ht 60.0 in | Wt 183.0 lb

## 2013-11-16 DIAGNOSIS — G500 Trigeminal neuralgia: Secondary | ICD-10-CM | POA: Diagnosis not present

## 2013-11-16 DIAGNOSIS — M545 Low back pain, unspecified: Secondary | ICD-10-CM | POA: Diagnosis not present

## 2013-11-16 DIAGNOSIS — Z23 Encounter for immunization: Secondary | ICD-10-CM | POA: Diagnosis not present

## 2013-11-16 DIAGNOSIS — E119 Type 2 diabetes mellitus without complications: Secondary | ICD-10-CM

## 2013-11-16 DIAGNOSIS — Z794 Long term (current) use of insulin: Secondary | ICD-10-CM | POA: Insufficient documentation

## 2013-11-16 DIAGNOSIS — I10 Essential (primary) hypertension: Secondary | ICD-10-CM

## 2013-11-16 DIAGNOSIS — E785 Hyperlipidemia, unspecified: Secondary | ICD-10-CM | POA: Insufficient documentation

## 2013-11-16 DIAGNOSIS — M79609 Pain in unspecified limb: Secondary | ICD-10-CM | POA: Insufficient documentation

## 2013-11-16 DIAGNOSIS — Z7982 Long term (current) use of aspirin: Secondary | ICD-10-CM | POA: Insufficient documentation

## 2013-11-16 DIAGNOSIS — IMO0002 Reserved for concepts with insufficient information to code with codable children: Secondary | ICD-10-CM | POA: Insufficient documentation

## 2013-11-16 DIAGNOSIS — G8929 Other chronic pain: Secondary | ICD-10-CM

## 2013-11-16 LAB — COMPLETE METABOLIC PANEL WITH GFR
ALK PHOS: 72 U/L (ref 39–117)
ALT: 12 U/L (ref 0–35)
AST: 15 U/L (ref 0–37)
Albumin: 4.2 g/dL (ref 3.5–5.2)
BILIRUBIN TOTAL: 0.6 mg/dL (ref 0.2–1.2)
BUN: 18 mg/dL (ref 6–23)
CO2: 27 meq/L (ref 19–32)
Calcium: 9.4 mg/dL (ref 8.4–10.5)
Chloride: 101 mEq/L (ref 96–112)
Creat: 0.71 mg/dL (ref 0.50–1.10)
GFR, Est African American: >89 mL/min
GFR, Est Non African American: >89 mL/min
Glucose, Bld: 173 mg/dL — ABNORMAL HIGH (ref 70–99)
Potassium: 4.3 mEq/L (ref 3.5–5.3)
SODIUM: 138 meq/L (ref 135–145)
TOTAL PROTEIN: 6.6 g/dL (ref 6.0–8.3)

## 2013-11-16 LAB — LIPID PANEL
Cholesterol: 166 mg/dL (ref 0–200)
HDL: 40 mg/dL (ref >39)
LDL CALC: 108 mg/dL — AB (ref 0–99)
Total CHOL/HDL Ratio: 4.2 Ratio
Triglycerides: 92 mg/dL (ref <150)
VLDL: 18 mg/dL (ref 0–40)

## 2013-11-16 LAB — POCT GLYCOSYLATED HEMOGLOBIN (HGB A1C): HEMOGLOBIN A1C: 6.9

## 2013-11-16 MED ORDER — METFORMIN HCL 1000 MG PO TABS: 1000.0000 mg | ORAL_TABLET | Freq: Two times a day (BID) | ORAL | Status: DC

## 2013-11-16 MED ORDER — ACETAMINOPHEN-CODEINE #3 300-30 MG PO TABS: 1.0000 | ORAL_TABLET | ORAL | Status: DC | PRN

## 2013-11-16 MED ORDER — GLIPIZIDE 5 MG PO TABS: 5.0000 mg | ORAL_TABLET | Freq: Two times a day (BID) | ORAL | Status: DC

## 2013-11-16 NOTE — Patient Instructions (Signed)
Diabetes Mellitus and Food It is important for you to manage your blood sugar (glucose) level. Your blood glucose level can be greatly affected by what you eat. Eating healthier foods in the appropriate amounts throughout the day at about the same time each day will help you control your blood glucose level. It can also help slow or prevent worsening of your diabetes mellitus. Healthy eating may even help you improve the level of your blood pressure and reach or maintain a healthy weight.  HOW CAN FOOD AFFECT ME? Carbohydrates Carbohydrates affect your blood glucose level more than any other type of food. Your dietitian will help you determine how many carbohydrates to eat at each meal and teach you how to count carbohydrates. Counting carbohydrates is important to keep your blood glucose at a healthy level, especially if you are using insulin or taking certain medicines for diabetes mellitus. Alcohol Alcohol can cause sudden decreases in blood glucose (hypoglycemia), especially if you use insulin or take certain medicines for diabetes mellitus. Hypoglycemia can be a life-threatening condition. Symptoms of hypoglycemia (sleepiness, dizziness, and disorientation) are similar to symptoms of having too much alcohol.  If your health care provider has given you approval to drink alcohol, do so in moderation and use the following guidelines:  Women should not have more than one drink per day, and men should not have more than two drinks per day. One drink is equal to:  12 oz of beer.  5 oz of wine.  1 oz of hard liquor.  Do not drink on an empty stomach.  Keep yourself hydrated. Have water, diet soda, or unsweetened iced tea.  Regular soda, juice, and other mixers might contain a lot of carbohydrates and should be counted. WHAT FOODS ARE NOT RECOMMENDED? As you make food choices, it is important to remember that all foods are not the same. Some foods have fewer nutrients per serving than other  foods, even though they might have the same number of calories or carbohydrates. It is difficult to get your body what it needs when you eat foods with fewer nutrients. Examples of foods that you should avoid that are high in calories and carbohydrates but low in nutrients include:  Trans fats (most processed foods list trans fats on the Nutrition Facts label).  Regular soda.  Juice.  Candy.  Sweets, such as cake, pie, doughnuts, and cookies.  Fried foods. WHAT FOODS CAN I EAT? Have nutrient-rich foods, which will nourish your body and keep you healthy. The food you should eat also will depend on several factors, including:  The calories you need.  The medicines you take.  Your weight.  Your blood glucose level.  Your blood pressure level.  Your cholesterol level. You also should eat a variety of foods, including:  Protein, such as meat, poultry, fish, tofu, nuts, and seeds (lean animal proteins are best).  Fruits.  Vegetables.  Dairy products, such as milk, cheese, and yogurt (low fat is best).  Breads, grains, pasta, cereal, rice, and beans.  Fats such as olive oil, trans fat-free margarine, canola oil, avocado, and olives. DOES EVERYONE WITH DIABETES MELLITUS HAVE THE SAME MEAL PLAN? Because every person with diabetes mellitus is different, there is not one meal plan that works for everyone. It is very important that you meet with a dietitian who will help you create a meal plan that is just right for you. Document Released: 11/15/2004 Document Revised: 02/23/2013 Document Reviewed: 01/15/2013 ExitCare Patient Information 2015 [employer]. This   information is not intended to replace advice given to you by your health care provider. Make sure you discuss any questions you have with your health care provider. Diabetes and Exercise Exercising regularly is important. It is not just about losing weight. It has many health benefits, such as:  Improving your overall  fitness, flexibility, and endurance.  Increasing your bone density.  Helping with weight control.  Decreasing your body fat.  Increasing your muscle strength.  Reducing stress and tension.  Improving your overall health. People with diabetes who exercise gain additional benefits because exercise:  Reduces appetite.  Improves the body's use of blood sugar (glucose).  Helps lower or control blood glucose.  Decreases blood pressure.  Helps control blood lipids (such as cholesterol and triglycerides).  Improves the body's use of the hormone insulin by:  Increasing the body's insulin sensitivity.  Reducing the body's insulin needs.  Decreases the risk for heart disease because exercising:  Lowers cholesterol and triglycerides levels.  Increases the levels of good cholesterol (such as high-density lipoproteins [HDL]) in the body.  Lowers blood glucose levels. YOUR ACTIVITY PLAN  Choose an activity that you enjoy and set realistic goals. Your health care provider or diabetes educator can help you make an activity plan that works for you. Exercise regularly as directed by your health care provider. This includes:  Performing resistance training twice a week such as push-ups, sit-ups, lifting weights, or using resistance bands.  Performing 150 minutes of cardio exercises each week such as walking, running, or playing sports.  Staying active and spending no more than 90 minutes at one time being inactive. Even short bursts of exercise are good for you. Three 10-minute sessions spread throughout the day are just as beneficial as a single 30-minute session. Some exercise ideas include:  Taking the dog for a walk.  Taking the stairs instead of the elevator.  Dancing to your favorite song.  Doing an exercise video.  Doing your favorite exercise with a friend. RECOMMENDATIONS FOR EXERCISING WITH TYPE 1 OR TYPE 2 DIABETES   Check your blood glucose before exercising. If  blood glucose levels are greater than 240 mg/dL, check for urine ketones. Do not exercise if ketones are present.  Avoid injecting insulin into areas of the body that are going to be exercised. For example, avoid injecting insulin into:  The arms when playing tennis.  The legs when jogging.  Keep a record of:  Food intake before and after you exercise.  Expected peak times of insulin action.  Blood glucose levels before and after you exercise.  The type and amount of exercise you have done.  Review your records with your health care provider. Your health care provider will help you to develop guidelines for adjusting food intake and insulin amounts before and after exercising.  If you take insulin or oral hypoglycemic agents, watch for signs and symptoms of hypoglycemia. They include:  Dizziness.  Shaking.  Sweating.  Chills.  Confusion.  Drink plenty of water while you exercise to prevent dehydration or heat stroke. Body water is lost during exercise and must be replaced.  Talk to your health care provider before starting an exercise program to make sure it is safe for you. Remember, almost any type of activity is better than none. Document Released: 05/11/2003 Document Revised: 07/05/2013 Document Reviewed: 07/28/2012 ExitCare Patient Information 2015 [employer]. This information is not intended to replace advice given to you by your health care provider. Make sure you discuss any   questions you have with your health care provider. Back Pain, Adult Low back pain is very common. About 1 in 5 people have back pain.The cause of low back pain is rarely dangerous. The pain often gets better over time.About half of people with a sudden onset of back pain feel better in just 2 weeks. About 8 in 10 people feel better by 6 weeks.  CAUSES Some common causes of back pain include:  Strain of the muscles or ligaments supporting the spine.  Wear and tear (degeneration) of the  spinal discs.  Arthritis.  Direct injury to the back. DIAGNOSIS Most of the time, the direct cause of low back pain is not known.However, back pain can be treated effectively even when the exact cause of the pain is unknown.Answering your caregiver's questions about your overall health and symptoms is one of the most accurate ways to make sure the cause of your pain is not dangerous. If your caregiver needs more information, he or she may order lab work or imaging tests (X-rays or MRIs).However, even if imaging tests show changes in your back, this usually does not require surgery. HOME CARE INSTRUCTIONS For many people, back pain returns.Since low back pain is rarely dangerous, it is often a condition that people can learn to [facility] their own.   Remain active. It is stressful on the back to sit or stand in one place. Do not sit, drive, or stand in one place for more than 30 minutes at a time. Take short walks on level surfaces as soon as pain allows.Try to increase the length of time you walk each day.  Do not stay in bed.Resting more than 1 or 2 days can delay your recovery.  Do not avoid exercise or work.Your body is made to move.It is not dangerous to be active, even though your back may hurt.Your back will likely heal faster if you return to being active before your pain is gone.  Pay attention to your body when you bend and lift. Many people have less discomfortwhen lifting if they bend their knees, keep the load close to their bodies,and avoid twisting. Often, the most comfortable positions are those that put less stress on your recovering back.  Find a comfortable position to sleep. Use a firm mattress and lie on your side with your knees slightly bent. If you lie on your back, put a pillow under your knees.  Only take over-the-counter or prescription medicines as directed by your caregiver. Over-the-counter medicines to reduce pain and inflammation are often the most  helpful.Your caregiver may prescribe muscle relaxant drugs.These medicines help dull your pain so you can more quickly return to your normal activities and healthy exercise.  Put ice on the injured area.  Put ice in a plastic bag.  Place a towel between your skin and the bag.  Leave the ice on for 15-20 minutes, 03-04 times a day for the first 2 to 3 days. After that, ice and heat may be alternated to reduce pain and spasms.  Ask your caregiver about trying back exercises and gentle massage. This may be of some benefit.  Avoid feeling anxious or stressed.Stress increases muscle tension and can worsen back pain.It is important to recognize when you are anxious or stressed and learn ways to manage it.Exercise is a great option. SEEK MEDICAL CARE IF:  You have pain that is not relieved with rest or medicine.  You have pain that does not improve in 1 week.  You have new  symptoms.  You are generally not feeling well. SEEK IMMEDIATE MEDICAL CARE IF:   You have pain that radiates from your back into your legs.  You develop new bowel or bladder control problems.  You have unusual weakness or numbness in your arms or legs.  You develop nausea or vomiting.  You develop abdominal pain.  You feel faint. Document Released: 02/18/2005 Document Revised: 08/20/2011 Document Reviewed: 06/22/2013 [facility] Patient Information 2015 [place], [place]. This information is not intended to replace advice given to you by your health care provider. Make sure you discuss any questions you have with your health care provider.

## 2013-11-16 NOTE — Progress Notes (Signed)
Patient ID: Joy Solis, female   DOB: 21-Jan-1966, 48 y.o.   MRN: [id number]   Joy Solis, is a 48 y.o. female  [id number]  [id number]  DOB - 04-27-1965  Chief Complaint  Patient presents with  . Hypertension  . Follow-up  . Diabetes        Subjective:   Joy Solis is a 48 y.o. female here today for a follow up visit. Patient has hypertension, diabetes mellitus, hyperlipidemia and osteoarthritis, here today for routine follow-up. Patient's major complaint today is bilateral foot pain, patient is on gabapentin. Wilder Glade was added to her regimen during her last visit, blood sugar is better controlled, patient claims compliant with medications without side effects. Patient requests flu vaccination today. Patient has No headache, No chest pain, No abdominal pain - No Nausea, No new weakness tingling or numbness, No Cough - SOB.  Problem  Type II Or Unspecified Type Diabetes Mellitus Without Mention of Complication, Not Stated As Uncontrolled  Essential Hypertension    ALLERGIES: No Known Allergies  PAST MEDICAL HISTORY: Past Medical History  Diagnosis Date  . Hypertension   . Diabetes mellitus   . Environmental allergies   . Spinal stenosis of lumbar region     pt was injected on 06/29/2013  . Hyperlipidemia   . Arthritis     MEDICATIONS AT HOME: Prior to Admission medications   Medication Sig Start Date End Date Taking? Authorizing Provider  aspirin 81 MG tablet Take 81 mg by mouth daily.   Yes Historical Provider, MD  cyclobenzaprine (FLEXERIL) 10 MG tablet Take 1 tablet (10 mg total) by mouth 2 (two) times daily as needed for muscle spasms. 10/24/13  Yes Tiffany G Greene, PA-C  FARXIGA 10 MG TABS TAKE 1 TABLET BY MOUTH ONCE DAILY 09/30/13  Yes Tresa Garter, MD  gabapentin (NEURONTIN) 400 MG capsule Take 1 capsule (400 mg total) by mouth 3 (three) times daily. 09/22/13  Yes Tresa Garter, MD  glipiZIDE (GLUCOTROL) 5 MG tablet Take 1 tablet (5 mg  total) by mouth 2 (two) times daily before a meal. 11/16/13  Yes Chaunice Obie E Doreene Burke, MD  hydrochlorothiazide (HYDRODIURIL) 25 MG tablet Take 1 tablet (25 mg total) by mouth daily. 04/09/13  Yes Reyne Dumas, MD  HYDROcodone-acetaminophen (NORCO/VICODIN) 5-325 MG per tablet Take 1-2 tablets by mouth every 6 (six) hours as needed for moderate pain or severe pain. 09/27/13  Yes Audelia Hives Presson, PA  lisinopril (PRINIVIL,ZESTRIL) 5 MG tablet Take 1 tablet (5 mg total) by mouth daily. 02/02/13  Yes Thurnell Lose, MD  acetaminophen-codeine (TYLENOL #3) 300-30 MG per tablet Take 1 tablet by mouth every 4 (four) hours as needed for moderate pain. 11/16/13   Tresa Garter, MD  atorvastatin (LIPITOR) 20 MG tablet Take 20 mg by mouth daily.    Historical Provider, MD  carbamazepine (TEGRETOL-XR) 100 MG 12 hr tablet Take 1 tablet (100 mg total) by mouth 2 (two) times daily. 08/12/13   Tresa Garter, MD  carvedilol (COREG) 6.25 MG tablet Take 1 tablet (6.25 mg total) by mouth 2 (two) times daily with a meal. 02/02/13   Thurnell Lose, MD  DULoxetine (CYMBALTA) 30 MG capsule Take 1 capsule (30 mg total) by mouth daily. 10/12/12   Tresa Garter, MD  fexofenadine (ALLEGRA) 180 MG tablet Take 1 tablet (180 mg total) by mouth daily. 08/03/12   Janne Napoleon, NP  fish oil-omega-3 fatty acids 1000 MG capsule Take 2 g by mouth daily.  Historical Provider, MD  fluconazole (DIFLUCAN) 150 MG tablet Take 1 tablet (150 mg total) by mouth once. 05/13/13   Theodis Blaze, MD  glucose monitoring kit (FREESTYLE) monitoring kit 1 each by Does not apply route 4 (four) times daily - after meals and at bedtime. 1 month Diabetic Testing Supplies for QAC-QHS accuchecks. Any manufacturer is acceptable. 04/09/13   Reyne Dumas, MD  guaiFENesin-dextromethorphan (ROBITUSSIN DM) 100-10 MG/5ML syrup Take 5 mLs by mouth every 4 (four) hours as needed for cough. 08/12/13   Tresa Garter, MD  HYDROcodone-acetaminophen  (NORCO/VICODIN) 5-325 MG per tablet Take 1-2 tablets by mouth every 4 (four) hours as needed. 10/24/13   Tiffany Marilu Favre, PA-C  Insulin Syringe-Needle U-100 (RELION INSULIN SYR .3CC/29G) 29G X 1/2" 0.3 ML MISC 1 Syringe by Does not apply route as directed. 08/26/12   Clanford Marisa Hua, MD  ketoconazole (NIZORAL) 2 % cream Apply topically daily. 08/12/12   Clanford Marisa Hua, MD  metFORMIN (GLUCOPHAGE) 1000 MG tablet Take 1 tablet (1,000 mg total) by mouth 2 (two) times daily with a meal. 11/16/13   Tresa Garter, MD  PARoxetine (PAXIL) 20 MG tablet Take 20 mg by mouth every morning.    Historical Provider, MD  predniSONE (DELTASONE) 10 MG tablet Take 1 tablet (10 mg total) by mouth daily with breakfast. 09/22/13   Tresa Garter, MD     Objective:   Filed Vitals:   11/16/13 0938  BP: 131/85  Pulse: 83  Temp: 99.6 F (37.6 C)  TempSrc: Oral  Resp: 16  Height: 5' (1.524 m)  Weight: 183 lb (83.008 kg)  SpO2: 97%    Exam General appearance : Awake, alert, not in any distress. Speech Clear. Not toxic looking HEENT: Atraumatic and Normocephalic, pupils equally reactive to light and accomodation Neck: supple, no JVD. No cervical lymphadenopathy.  Chest:Good air entry bilaterally, no added sounds  CVS: S1 S2 regular, no murmurs.  Abdomen: Bowel sounds present, Non tender and not distended with no gaurding, rigidity or rebound. Extremities: B/L Lower Ext shows no edema, both legs are warm to touch Neurology: Awake alert, and oriented X 3, CN II-XII intact, Non focal Skin:No Rash Wounds:N/A  Data Review Lab Results  Component Value Date   HGBA1C 6.9 11/16/2013   HGBA1C 8.2 08/12/2013   HGBA1C 9.0 05/13/2013     Assessment & Plan   1. Type II or unspecified type diabetes mellitus without mention of complication, not stated as uncontrolled  - HgB A1c has come down to 6.9% today from 8.2% 3 months ago. - metFORMIN (GLUCOPHAGE) 1000 MG tablet; Take 1 tablet (1,000 mg total)  by mouth 2 (two) times daily with a meal.  Dispense: 180 tablet; Refill: 3 - glipiZIDE (GLUCOTROL) 5 MG tablet; Take 1 tablet (5 mg total) by mouth 2 (two) times daily before a meal.  Dispense: 180 tablet; Refill: 3 - Lipid Panel - Microalbumin/Creatinine Ratio, Urine - Microalbumin, urine, 24 hour; Future   Aim for 2-3 Carb Choices per meal (30-45 grams) +/- 1 either way  Aim for 0-15 Carbs per snack if hungry  Include protein in moderation with your meals and snacks  Consider reading food labels for Total Carbohydrate and Fat Grams of foods  Consider checking BG at alternate times per day  Continue taking medication as directed Fruit Punch - find one with no sugar  Measure and decrease portions of carbohydrate foods  Make your plate and don't go back for seconds   2.  Trigeminal neuralgia pain Continue current medications  3. Essential hypertension - Continue current medications - COMPLETE METABOLIC PANEL WITH GFR - DASH diet - We have discussed the blood pressure goal, patient is to report to clinic if blood pressure is persistently above 140/90 mmHg. Patient encouraged to be compliant with medication, consequences of uncontrolled hypertension discussed.  4. Chronic low back pain  - acetaminophen-codeine (TYLENOL #3) 300-30 MG per tablet; Take 1 tablet by mouth every 4 (four) hours as needed for moderate pain.  Dispense: 90 tablet; Refill: 0   Return in about 3 months (around 02/15/2014), or if symptoms worsen or fail to improve, for Hemoglobin A1C and Follow up, DM, Follow up HTN.  The patient was given clear instructions to go to ER or return to medical center if symptoms don't improve, worsen or new problems develop. The patient verbalized understanding. The patient was told to call to get lab results if they haven't heard anything in the next week.   This note has been created with Surveyor, quantity. Any transcriptional errors are  unintentional.    Angelica Chessman, MD, [place], [place], [place] and [place], [place]   11/16/2013, 9:58 AM

## 2013-11-16 NOTE — Progress Notes (Signed)
Pt here for 3 monht f/u Diabetes,HTN with medical management Pt states she is compliant with taking prescribed medications C/o bilat foot pain-taking Gabapentin States she is loosing weight s/p taking [place] Requesting flu vaccine

## 2013-11-17 LAB — MICROALBUMIN, URINE: Microalb, Ur: 0.5 mg/dL (ref 0.00–1.89)

## 2013-11-17 LAB — MICROALBUMIN / CREATININE URINE RATIO
CREATININE, URINE: 49.6 mg/dL
MICROALB/CREAT RATIO: 10.1 mg/g (ref 0.0–30.0)
Microalb, Ur: 0.5 mg/dL (ref 0.00–1.89)

## 2013-11-24 ENCOUNTER — Ambulatory Visit (HOSPITAL_COMMUNITY)
Admission: RE | Admit: 2013-11-24 | Discharge: 2013-11-24 | Disposition: A | Discharge status: Home / Self Care | Payer: Managed Care, Other (non HMO) | Source: Ambulatory Visit | Attending: Neurosurgery | Admitting: Neurosurgery

## 2013-11-24 ENCOUNTER — Other Ambulatory Visit (HOSPITAL_COMMUNITY): Payer: Self-pay | Admitting: Neurosurgery

## 2013-11-24 ENCOUNTER — Encounter (HOSPITAL_COMMUNITY): Payer: Self-pay | Admitting: Pharmacy Technician

## 2013-11-24 DIAGNOSIS — R209 Unspecified disturbances of skin sensation: Secondary | ICD-10-CM | POA: Insufficient documentation

## 2013-11-24 DIAGNOSIS — M47817 Spondylosis without myelopathy or radiculopathy, lumbosacral region: Secondary | ICD-10-CM | POA: Insufficient documentation

## 2013-11-24 DIAGNOSIS — M48061 Spinal stenosis, lumbar region without neurogenic claudication: Secondary | ICD-10-CM | POA: Diagnosis not present

## 2013-11-24 LAB — GLUCOSE, CAPILLARY: GLUCOSE-CAPILLARY: 148 mg/dL — AB (ref 70–99)

## 2013-11-24 MED ORDER — DIAZEPAM 5 MG PO TABS
10.0000 mg | ORAL_TABLET | Freq: Once | ORAL | Status: AC
  Administered 2013-11-24: 10 mg via ORAL
  Filled 2013-11-24: qty 2

## 2013-11-24 MED ORDER — IOHEXOL 180 MG/ML  SOLN
20.0000 mL | Freq: Once | INTRAMUSCULAR | Status: AC | PRN
  Administered 2013-11-24: 8 mL via INTRATHECAL

## 2013-11-24 MED ORDER — DIAZEPAM 5 MG PO TABS
ORAL_TABLET | ORAL | Status: AC
  Administered 2013-11-24: 10 mg via ORAL
  Filled 2013-11-24: qty 2

## 2013-11-24 MED ORDER — ONDANSETRON HCL 4 MG/2ML IJ SOLN: 4.0000 mg | Freq: Four times a day (QID) | INTRAMUSCULAR | Status: DC | PRN

## 2013-11-24 MED ORDER — MORPHINE SULFATE 4 MG/ML IJ SOLN
INTRAMUSCULAR | Status: AC
  Administered 2013-11-24: 4 mg via INTRAMUSCULAR
  Filled 2013-11-24: qty 2

## 2013-11-24 MED ORDER — MORPHINE SULFATE 4 MG/ML IJ SOLN
8.0000 mg | INTRAMUSCULAR | Status: DC | PRN
  Administered 2013-11-24: 4 mg via INTRAMUSCULAR

## 2013-11-24 NOTE — Discharge Instructions (Signed)
Myelography, Care After °These instructions give you information on caring for yourself after your procedure. Your doctor may also give you specific instructions. Call your doctor if you have any problems or questions after your procedure. °HOME CARE °· Rest often the first day. °· When you rest, lie flat, with your head slightly raised (elevated). °· Avoid heavy lifting and activity for 48 hours. °· You may take the bandage (dressing) off 1 day after the test. °GET HELP RIGHT AWAY IF:  °· You have a very bad headache. °· You have a fever. °MAKE SURE YOU: °· Understand these instructions. °· Will watch your condition. °· Will get help right away if you are not doing well or get worse. °Document Released: 11/28/2007 Document Revised: 07/05/2013 Document Reviewed: 06/09/2013 °ExitCare® Patient Information ©2015 [employer]. This information is not intended to replace advice given to you by your health care provider. Make sure you discuss any questions you have with your health care provider. ° ° °

## 2013-11-24 NOTE — Procedures (Signed)
Procedure: Lumbar Puncture with Fluoro Guidance at L5-S1 for Lumbar Myelogram. Specimen: None Bleeding: Minimal. Complications: None immediate. Patient   -Condition: Stable.  -Disposition:  To CT, and then to Recovery.  Full Radiology Report to Follow

## 2013-11-26 ENCOUNTER — Telehealth: Payer: Self-pay | Admitting: Emergency Medicine

## 2013-11-26 NOTE — Telephone Encounter (Signed)
Message copied by Ricci Barker on Fri Nov 26, 2013  5:51 PM ------      Message from: Angelica Chessman E      Created: Wed Nov 24, 2013 10:39 AM       Please inform patient that her laboratory tests results are mostly within normal limit. Continue blood sugar control, drink plenty of water, regular physical exercise. ------

## 2013-11-26 NOTE — Telephone Encounter (Signed)
Pt given lab results with instructions to continue blood sugar control,drink plenty of water with regular exercise

## 2013-12-22 NOTE — Progress Notes (Signed)
Patient ID: Joy Solis, female   DOB: 03-26-1965, 48 y.o.   MRN: [id number]   Joy Solis, is a 48 y.o. female  BVQ:[id number]  CMK:[id number]  DOB - June 13, 1965  Chief Complaint  Patient presents with  . Follow-up        Subjective:   Joy Solis is a 48 y.o. female here today for a follow up visit. Patient has history of hypertension, diabetes mellitus, dyslipidemia, and osteoarthritis here today following up on her diabetes and HTN. Pt states that her jaw is very painful making it difficult to talk, eat and drink. He denies any fever. He denies any trauma. No swelling or redness. Patient has No headache, No chest pain, No abdominal pain - No Nausea, No new weakness tingling or numbness, No Cough - SOB.  No problems updated.  ALLERGIES: Allergies  Allergen Reactions  . Coreg [Carvedilol]     cough  . Lisinopril     cough    PAST MEDICAL HISTORY: Past Medical History  Diagnosis Date  . Hypertension   . Diabetes mellitus   . Environmental allergies   . Spinal stenosis of lumbar region     pt was injected on 06/29/2013  . Hyperlipidemia   . Arthritis     MEDICATIONS AT HOME: Prior to Admission medications   Medication Sig Start Date End Date Taking? Authorizing Provider  aspirin 81 MG tablet Take 81 mg by mouth daily.   Yes Historical Provider, MD  glucose monitoring kit (FREESTYLE) monitoring kit 1 each by Does not apply route 4 (four) times daily - after meals and at bedtime. 1 month Diabetic Testing Supplies for QAC-QHS accuchecks. Any manufacturer is acceptable. 04/09/13  Yes Reyne Dumas, MD  ketoconazole (NIZORAL) 2 % cream Apply topically daily. 08/12/12  Yes Clanford Marisa Hua, MD  acetaminophen-codeine (TYLENOL #3) 300-30 MG per tablet Take 1 tablet by mouth every 4 (four) hours as needed for moderate pain. 11/16/13   Tresa Garter, MD  atorvastatin (LIPITOR) 10 MG tablet Take 10 mg by mouth at bedtime.    Historical Provider, MD  cyclobenzaprine  (FLEXERIL) 10 MG tablet Take 1 tablet (10 mg total) by mouth 2 (two) times daily as needed for muscle spasms. 10/24/13   Tiffany G Greene, PA-C  FARXIGA 10 MG TABS TAKE 1 TABLET BY MOUTH ONCE DAILY 09/30/13   Tresa Garter, MD  gabapentin (NEURONTIN) 400 MG capsule Take 1 capsule (400 mg total) by mouth 3 (three) times daily. 09/22/13   Tresa Garter, MD  glipiZIDE (GLUCOTROL) 5 MG tablet Take 1 tablet (5 mg total) by mouth 2 (two) times daily before a meal. 11/16/13   Jessy Calixte E Doreene Burke, MD  guaiFENesin-dextromethorphan (ROBITUSSIN DM) 100-10 MG/5ML syrup Take 5 mLs by mouth every 4 (four) hours as needed for cough. 08/12/13   Tresa Garter, MD  hydrochlorothiazide (HYDRODIURIL) 25 MG tablet Take 1 tablet (25 mg total) by mouth daily. 04/09/13   Reyne Dumas, MD  HYDROcodone-acetaminophen (NORCO/VICODIN) 5-325 MG per tablet Take 1-2 tablets by mouth every 4 (four) hours as needed. 10/24/13   Tiffany Marilu Favre, PA-C  ibuprofen (ADVIL,MOTRIN) 200 MG tablet Take 800 mg by mouth every 6 (six) hours as needed for mild pain.    Historical Provider, MD  Insulin Syringe-Needle U-100 (RELION INSULIN SYR .3CC/29G) 29G X 1/2" 0.3 ML MISC 1 Syringe by Does not apply route as directed. 08/26/12   Clanford Marisa Hua, MD  Menthol-Methyl Salicylate (BEN GAY GREASELESS) 10-15 % greaseless cream Apply  1 application topically 3 (three) times daily as needed for pain (to knee).    Historical Provider, MD  metFORMIN (GLUCOPHAGE) 1000 MG tablet Take 1 tablet (1,000 mg total) by mouth 2 (two) times daily with a meal. 11/16/13   Tresa Garter, MD     Objective:   Filed Vitals:   08/12/13 1009  BP: 123/81  Pulse: 85  Temp: 99.2 F (37.3 C)  TempSrc: Oral  Resp: 16  Height: 5' (1.524 m)  Weight: 194 lb (87.998 kg)  SpO2: 97%    Exam General appearance : Awake, alert, not in any distress. Speech Clear. Not toxic looking HEENT: Atraumatic and Normocephalic, pupils equally reactive to light and  accomodation Neck: supple, no JVD. No cervical lymphadenopathy.  Chest:Good air entry bilaterally, no added sounds  CVS: S1 S2 regular, no murmurs.  Abdomen: Bowel sounds present, Non tender and not distended with no gaurding, rigidity or rebound. Extremities: B/L Lower Ext shows no edema, both legs are warm to touch Neurology: Awake alert, and oriented X 3, CN II-XII intact, Non focal Skin:No Rash Wounds:N/A    Assessment & Plan   1. Diabetes  - Glucose (CBG) - HgB A1c is down to 8.2% from 9.0%  2. Trigeminal neuralgia Continue gabapentin  3. HTN (hypertension) Continue current regimen DASH diet  4. DM (diabetes mellitus) type 2, uncontrolled, with ketoacidosis  Continue current regimen  Patient was extensively counseled on nutrition and exercise   Return in about 3 months (around 11/12/2013), or if symptoms worsen or fail to improve, for Hemoglobin A1C and Follow up, DM, Follow up HTN.  The patient was given clear instructions to go to ER or return to medical center if symptoms don't improve, worsen or new problems develop. The patient verbalized understanding. The patient was told to call to get lab results if they haven't heard anything in the next week.   This note has been created with Surveyor, quantity. Any transcriptional errors are unintentional.    Angelica Chessman, MD, [place], [place], [place] and [facility], [place]   08/12/2013

## 2014-01-18 ENCOUNTER — Other Ambulatory Visit: Payer: Self-pay | Admitting: Internal Medicine

## 2014-01-20 ENCOUNTER — Other Ambulatory Visit (HOSPITAL_COMMUNITY): Payer: Self-pay | Admitting: Neurosurgery

## 2014-01-25 ENCOUNTER — Encounter (HOSPITAL_COMMUNITY): Payer: Self-pay

## 2014-01-25 ENCOUNTER — Other Ambulatory Visit: Payer: Self-pay | Admitting: Internal Medicine

## 2014-01-25 ENCOUNTER — Encounter (HOSPITAL_COMMUNITY)
Admission: RE | Admit: 2014-01-25 | Discharge: 2014-01-25 | Disposition: A | Discharge status: Home / Self Care | Payer: Private Health Insurance - Indemnity | Source: Ambulatory Visit | Attending: Neurosurgery | Admitting: Neurosurgery

## 2014-01-25 ENCOUNTER — Other Ambulatory Visit: Payer: Self-pay | Admitting: Emergency Medicine

## 2014-01-25 DIAGNOSIS — I498 Other specified cardiac arrhythmias: Secondary | ICD-10-CM | POA: Insufficient documentation

## 2014-01-25 DIAGNOSIS — Z01818 Encounter for other preprocedural examination: Secondary | ICD-10-CM | POA: Diagnosis not present

## 2014-01-25 HISTORY — DX: Cardiac arrhythmia, unspecified: I49.9

## 2014-01-25 HISTORY — DX: Respiratory tuberculosis unspecified: A15.9

## 2014-01-25 HISTORY — DX: Gastro-esophageal reflux disease without esophagitis: K21.9

## 2014-01-25 HISTORY — DX: Myoneural disorder, unspecified: G70.9

## 2014-01-25 LAB — BASIC METABOLIC PANEL
Anion gap: 16 — ABNORMAL HIGH (ref 5–15)
BUN: 14 mg/dL (ref 6–23)
CALCIUM: 9.3 mg/dL (ref 8.4–10.5)
CO2: 22 meq/L (ref 19–32)
CREATININE: 0.61 mg/dL (ref 0.50–1.10)
Chloride: 100 mEq/L (ref 96–112)
GFR calc Af Amer: >90 mL/min (ref >90)
GLUCOSE: 208 mg/dL — AB (ref 70–99)
Potassium: 3.7 mEq/L (ref 3.7–5.3)
SODIUM: 138 meq/L (ref 137–147)

## 2014-01-25 LAB — CBC
HEMATOCRIT: 38.1 % (ref 36.0–46.0)
Hemoglobin: 12.2 g/dL (ref 12.0–15.0)
MCH: 26.5 pg (ref 26.0–34.0)
MCHC: 32 g/dL (ref 30.0–36.0)
MCV: 82.8 fL (ref 78.0–100.0)
PLATELETS: 340 10*3/uL (ref 150–400)
RBC: 4.6 MIL/uL (ref 3.87–5.11)
RDW: 16.5 % — AB (ref 11.5–15.5)
WBC: 9.8 10*3/uL (ref 4.0–10.5)

## 2014-01-25 LAB — SURGICAL PCR SCREEN
MRSA, PCR: NEGATIVE
Staphylococcus aureus: POSITIVE — AB

## 2014-01-25 LAB — TYPE AND SCREEN
ABO/RH(D): A POS
Antibody Screen: NEGATIVE

## 2014-01-25 LAB — HCG, SERUM, QUALITATIVE: Preg, Serum: NEGATIVE

## 2014-01-25 LAB — ABO/RH: ABO/RH(D): A POS

## 2014-01-25 MED ORDER — ATORVASTATIN CALCIUM 10 MG PO TABS: 10.0000 mg | ORAL_TABLET | Freq: Every day | ORAL | Status: DC

## 2014-01-25 NOTE — Progress Notes (Signed)
Pharmacy tech. Into see pt. At PAT appt.

## 2014-01-25 NOTE — Progress Notes (Signed)
Call to [facility] at Dr. Lacy Duverney office, reported Dr. Christella Noa needs to sign his orders.

## 2014-01-25 NOTE — Pre-Procedure Instructions (Signed)
Joy Solis  01/25/2014   Your procedure is scheduled on:  Thursday, February 03, 2014  Report to [facility] Admitting at 10:30 AM.  Call this number if you have problems the morning of surgery: [phone]   Remember:   Do not eat food or drink liquids after midnight Wednesday, February 02, 2014   Take these medicines the morning of surgery with A SIP OF WATER: gabapentin (NEURONTIN), if  needed: Tylenol OR hydrocodone for pain, muscle relaxer   DO NOT TAKE ANY DIABETIC MEDICATIONS ON THE MORNING OF PROCEDURE  Stop taking Aspirin, vitamin, and herbal medications. Do not take any NSAIDs ie: Ibuprofen, Advil, Naproxen or any medication containing Aspirin; stop 5 days prior to procedure ( Saturday, January 29, 2014).  Do not wear jewelry, make-up or nail polish.  Do not wear lotions, powders, or perfumes. You may not wear deodorant.  Do not shave 48 hours prior to surgery.   Do not bring valuables to the hospital.  [facility] is not responsible for any belongings or valuables.               Contacts, dentures or bridgework may not be worn into surgery.  Leave suitcase in the car. After surgery it may be brought to your room.  For patients admitted to the hospital, discharge time is determined by your treatment team.               Patients discharged the day of surgery will not be allowed to drive home.  Name and phone number of your driver: friend  Special Instructions:  Special Instructions:Special Instructions: [place] - Preparing for Surgery  Before surgery, you can play an important role.  Because skin is not sterile, your skin needs to be as free of germs as possible.  You can reduce the number of germs on you skin by washing with CHG (chlorahexidine gluconate) soap before surgery.  CHG is an antiseptic cleaner which kills germs and bonds with the skin to continue killing germs even after washing.  Please DO NOT use if you have an allergy to CHG or antibacterial  soaps.  If your skin becomes reddened/irritated stop using the CHG and inform your nurse when you arrive at Short Stay.  Do not shave (including legs and underarms) for at least 48 hours prior to the first CHG shower.  You may shave your face.  Please follow these instructions carefully:   1.  Shower with CHG Soap the night before surgery and the morning of Surgery.  2.  If you choose to wash your hair, wash your hair first as usual with your normal shampoo.  3.  After you shampoo, rinse your hair and body thoroughly to remove the Shampoo.  4.  Use CHG as you would any other liquid soap.  You can apply chg directly  to the skin and wash gently with scrungie or a clean washcloth.  5.  Apply the CHG Soap to your body ONLY FROM THE NECK DOWN.  Do not use on open wounds or open sores.  Avoid contact with your eyes, ears, mouth and genitals (private parts).  Wash genitals (private parts) with your normal soap.  6.  Wash thoroughly, paying special attention to the area where your surgery will be performed.  7.  Thoroughly rinse your body with warm water from the neck down.  8.  DO NOT shower/wash with your normal soap after using and rinsing off the CHG Soap.  9.  Pat yourself dry with a clean towel.            10.  Wear clean pajamas.            11.  Place clean sheets on your bed the night of your first shower and do not sleep with pets.  Day of Surgery  Do not apply any lotions/deodorants the morning of surgery.  Please wear clean clothes to the hospital/surgery center.   Please read over the following fact sheets that you were given: Pain Booklet, Coughing and Deep Breathing, Blood Transfusion Information, MRSA Information and Surgical Site Infection Prevention

## 2014-02-02 MED ORDER — CEFAZOLIN SODIUM-DEXTROSE 2-3 GM-% IV SOLR
2.0000 g | INTRAVENOUS | Status: AC
  Administered 2014-02-03 (×2): 2 g via INTRAVENOUS
  Filled 2014-02-02: qty 50

## 2014-02-03 ENCOUNTER — Encounter (HOSPITAL_COMMUNITY): Payer: Self-pay | Admitting: *Deleted

## 2014-02-03 ENCOUNTER — Encounter (HOSPITAL_COMMUNITY)
Admission: RE | Disposition: A | Discharge status: Home / Self Care | Payer: Private Health Insurance - Indemnity | Source: Ambulatory Visit | Attending: Neurosurgery

## 2014-02-03 ENCOUNTER — Inpatient Hospital Stay (HOSPITAL_COMMUNITY): Payer: Managed Care, Other (non HMO) | Admitting: Certified Registered Nurse Anesthetist

## 2014-02-03 ENCOUNTER — Inpatient Hospital Stay (HOSPITAL_COMMUNITY): Payer: Managed Care, Other (non HMO)

## 2014-02-03 ENCOUNTER — Inpatient Hospital Stay (HOSPITAL_COMMUNITY)
Admission: RE | Admit: 2014-02-03 | Discharge: 2014-02-09 | DRG: 460 | Disposition: A | Discharge status: Home / Self Care | Payer: Managed Care, Other (non HMO) | Source: Ambulatory Visit | Attending: Neurosurgery | Admitting: Neurosurgery

## 2014-02-03 DIAGNOSIS — I10 Essential (primary) hypertension: Secondary | ICD-10-CM | POA: Diagnosis present

## 2014-02-03 DIAGNOSIS — M4316 Spondylolisthesis, lumbar region: Principal | ICD-10-CM | POA: Diagnosis present

## 2014-02-03 DIAGNOSIS — E119 Type 2 diabetes mellitus without complications: Secondary | ICD-10-CM | POA: Diagnosis present

## 2014-02-03 DIAGNOSIS — F17210 Nicotine dependence, cigarettes, uncomplicated: Secondary | ICD-10-CM | POA: Diagnosis present

## 2014-02-03 DIAGNOSIS — M4720 Other spondylosis with radiculopathy, site unspecified: Secondary | ICD-10-CM | POA: Diagnosis present

## 2014-02-03 DIAGNOSIS — Z419 Encounter for procedure for purposes other than remedying health state, unspecified: Secondary | ICD-10-CM

## 2014-02-03 LAB — GLUCOSE, CAPILLARY
GLUCOSE-CAPILLARY: 161 mg/dL — AB (ref 70–99)
GLUCOSE-CAPILLARY: 222 mg/dL — AB (ref 70–99)
GLUCOSE-CAPILLARY: 192 mg/dL — AB (ref 70–99)

## 2014-02-03 SURGERY — POSTERIOR LUMBAR FUSION 2 LEVEL
Anesthesia: General | Site: Spine Lumbar

## 2014-02-03 MED ORDER — ONDANSETRON HCL 4 MG/2ML IJ SOLN: 4.0000 mg | Freq: Once | INTRAMUSCULAR | Status: DC | PRN

## 2014-02-03 MED ORDER — LIDOCAINE HCL (CARDIAC) 20 MG/ML IV SOLN
INTRAVENOUS | Status: DC | PRN
  Administered 2014-02-03: 100 mg via INTRAVENOUS

## 2014-02-03 MED ORDER — PROPOFOL 10 MG/ML IV BOLUS
INTRAVENOUS | Status: DC | PRN
  Administered 2014-02-03: 70 mg via INTRAVENOUS
  Administered 2014-02-03: 130 mg via INTRAVENOUS

## 2014-02-03 MED ORDER — DIPHENHYDRAMINE HCL 25 MG PO CAPS
25.0000 mg | ORAL_CAPSULE | Freq: Every evening | ORAL | Status: DC | PRN
  Administered 2014-02-04: 25 mg via ORAL
  Filled 2014-02-03 (×2): qty 1

## 2014-02-03 MED ORDER — HYDROMORPHONE HCL 1 MG/ML IJ SOLN
0.2500 mg | INTRAMUSCULAR | Status: DC | PRN
  Administered 2014-02-03 (×4): 0.5 mg via INTRAVENOUS

## 2014-02-03 MED ORDER — GLYCOPYRROLATE 0.2 MG/ML IJ SOLN
INTRAMUSCULAR | Status: DC | PRN
  Administered 2014-02-03: .7 mg via INTRAVENOUS

## 2014-02-03 MED ORDER — HYDROMORPHONE HCL 1 MG/ML IJ SOLN
0.5000 mg | INTRAMUSCULAR | Status: DC | PRN
  Administered 2014-02-03 – 2014-02-05 (×7): 1 mg via INTRAVENOUS

## 2014-02-03 MED ORDER — POTASSIUM CHLORIDE IN NACL 20-0.9 MEQ/L-% IV SOLN
INTRAVENOUS | Status: DC
  Filled 2014-02-03 (×2): qty 1000

## 2014-02-03 MED ORDER — LACTATED RINGERS IV SOLN
INTRAVENOUS | Status: DC | PRN
  Administered 2014-02-03 (×2): via INTRAVENOUS

## 2014-02-03 MED ORDER — 0.9 % SODIUM CHLORIDE (POUR BTL) OPTIME
TOPICAL | Status: DC | PRN
  Administered 2014-02-03 (×2): 1000 mL

## 2014-02-03 MED ORDER — ACETAMINOPHEN 325 MG PO TABS: 650.0000 mg | ORAL_TABLET | ORAL | Status: DC | PRN

## 2014-02-03 MED ORDER — HYDROCHLOROTHIAZIDE 25 MG PO TABS
25.0000 mg | ORAL_TABLET | Freq: Every day | ORAL | Status: DC
  Administered 2014-02-04 – 2014-02-09 (×6): 25 mg via ORAL
  Filled 2014-02-03 (×6): qty 1

## 2014-02-03 MED ORDER — MIDAZOLAM HCL 2 MG/2ML IJ SOLN
INTRAMUSCULAR | Status: AC
  Filled 2014-02-03: qty 2

## 2014-02-03 MED ORDER — CEFAZOLIN SODIUM-DEXTROSE 2-3 GM-% IV SOLR
2.0000 g | Freq: Three times a day (TID) | INTRAVENOUS | Status: AC
  Administered 2014-02-04 (×2): 2 g via INTRAVENOUS
  Filled 2014-02-03 (×3): qty 50

## 2014-02-03 MED ORDER — DEXAMETHASONE SODIUM PHOSPHATE 4 MG/ML IJ SOLN
INTRAMUSCULAR | Status: AC
Start: 2014-02-03 — End: 2014-02-03
  Filled 2014-02-03: qty 2

## 2014-02-03 MED ORDER — DEXAMETHASONE SODIUM PHOSPHATE 4 MG/ML IJ SOLN
INTRAMUSCULAR | Status: DC | PRN
  Administered 2014-02-03: 8 mg via INTRAVENOUS

## 2014-02-03 MED ORDER — HYDROMORPHONE HCL 1 MG/ML IJ SOLN: 0.2500 mg | INTRAMUSCULAR | Status: DC | PRN

## 2014-02-03 MED ORDER — SODIUM CHLORIDE 0.9 % IV SOLN: 250.0000 mL | INTRAVENOUS | Status: DC

## 2014-02-03 MED ORDER — HYDROCODONE-ACETAMINOPHEN 5-325 MG PO TABS
1.0000 | ORAL_TABLET | ORAL | Status: DC | PRN
  Administered 2014-02-04 (×2): 1 via ORAL
  Administered 2014-02-07 – 2014-02-09 (×2): 2 via ORAL
  Filled 2014-02-03 (×2): qty 2

## 2014-02-03 MED ORDER — MEPERIDINE HCL 25 MG/ML IJ SOLN: 6.2500 mg | INTRAMUSCULAR | Status: DC | PRN

## 2014-02-03 MED ORDER — OXYCODONE HCL 5 MG PO TABS: 5.0000 mg | ORAL_TABLET | Freq: Once | ORAL | Status: DC | PRN

## 2014-02-03 MED ORDER — ONDANSETRON HCL 4 MG/2ML IJ SOLN: 4.0000 mg | INTRAMUSCULAR | Status: DC | PRN

## 2014-02-03 MED ORDER — SODIUM CHLORIDE 0.9 % IJ SOLN: 3.0000 mL | INTRAMUSCULAR | Status: DC | PRN

## 2014-02-03 MED ORDER — POLYETHYLENE GLYCOL 3350 17 G PO PACK: 17.0000 g | PACK | Freq: Every day | ORAL | Status: DC | PRN

## 2014-02-03 MED ORDER — ZOLPIDEM TARTRATE 5 MG PO TABS
5.0000 mg | ORAL_TABLET | Freq: Every evening | ORAL | Status: DC | PRN
  Administered 2014-02-08: 5 mg via ORAL
  Filled 2014-02-03: qty 1

## 2014-02-03 MED ORDER — ROCURONIUM BROMIDE 100 MG/10ML IV SOLN
INTRAVENOUS | Status: DC | PRN
  Administered 2014-02-03 (×2): 10 mg via INTRAVENOUS
  Administered 2014-02-03: 50 mg via INTRAVENOUS
  Administered 2014-02-03 (×2): 10 mg via INTRAVENOUS

## 2014-02-03 MED ORDER — HYDROMORPHONE HCL 1 MG/ML IJ SOLN
INTRAMUSCULAR | Status: AC
  Filled 2014-02-03: qty 1

## 2014-02-03 MED ORDER — PHENOL 1.4 % MT LIQD: 1.0000 | OROMUCOSAL | Status: DC | PRN

## 2014-02-03 MED ORDER — MIDAZOLAM HCL 5 MG/5ML IJ SOLN
INTRAMUSCULAR | Status: DC | PRN
  Administered 2014-02-03: 2 mg via INTRAVENOUS

## 2014-02-03 MED ORDER — LIDOCAINE-EPINEPHRINE 0.5 %-1:200000 IJ SOLN
INTRAMUSCULAR | Status: DC | PRN
  Administered 2014-02-03: 10 mL

## 2014-02-03 MED ORDER — ATORVASTATIN CALCIUM 10 MG PO TABS
10.0000 mg | ORAL_TABLET | Freq: Every day | ORAL | Status: DC
  Administered 2014-02-03 – 2014-02-08 (×6): 10 mg via ORAL
  Filled 2014-02-03 (×6): qty 1

## 2014-02-03 MED ORDER — DAPAGLIFLOZIN PROPANEDIOL 10 MG PO TABS
10.0000 mg | ORAL_TABLET | Freq: Every day | ORAL | Status: DC
  Administered 2014-02-04 – 2014-02-09 (×6): 10 mg via ORAL
  Filled 2014-02-03 (×7): qty 10

## 2014-02-03 MED ORDER — ARTIFICIAL TEARS OP OINT
TOPICAL_OINTMENT | OPHTHALMIC | Status: DC | PRN
  Administered 2014-02-03: 1 via OPHTHALMIC

## 2014-02-03 MED ORDER — OXYCODONE HCL 5 MG/5ML PO SOLN: 5.0000 mg | Freq: Once | ORAL | Status: DC | PRN

## 2014-02-03 MED ORDER — INSULIN ASPART 100 UNIT/ML ~~LOC~~ SOLN
0.0000 [IU] | Freq: Every day | SUBCUTANEOUS | Status: DC
  Administered 2014-02-04 – 2014-02-06 (×2): 2 [IU] via SUBCUTANEOUS

## 2014-02-03 MED ORDER — FENTANYL CITRATE 0.05 MG/ML IJ SOLN
INTRAMUSCULAR | Status: AC
  Filled 2014-02-03: qty 5

## 2014-02-03 MED ORDER — SODIUM CHLORIDE 0.9 % IJ SOLN
3.0000 mL | Freq: Two times a day (BID) | INTRAMUSCULAR | Status: DC
  Administered 2014-02-04 – 2014-02-05 (×3): 3 mL via INTRAVENOUS

## 2014-02-03 MED ORDER — ALBUMIN HUMAN 5 % IV SOLN
INTRAVENOUS | Status: DC | PRN
  Administered 2014-02-03 (×2): via INTRAVENOUS

## 2014-02-03 MED ORDER — FENTANYL CITRATE 0.05 MG/ML IJ SOLN
INTRAMUSCULAR | Status: DC | PRN
  Administered 2014-02-03 (×2): 50 ug via INTRAVENOUS
  Administered 2014-02-03 (×2): 100 ug via INTRAVENOUS
  Administered 2014-02-03: 50 ug via INTRAVENOUS
  Administered 2014-02-03 (×2): 100 ug via INTRAVENOUS
  Administered 2014-02-03 (×3): 50 ug via INTRAVENOUS
  Administered 2014-02-03: 100 ug via INTRAVENOUS
  Administered 2014-02-03: 50 ug via INTRAVENOUS
  Administered 2014-02-03: 200 ug via INTRAVENOUS

## 2014-02-03 MED ORDER — ASPIRIN EC 81 MG PO TBEC
81.0000 mg | DELAYED_RELEASE_TABLET | Freq: Every day | ORAL | Status: DC
  Administered 2014-02-04 – 2014-02-09 (×6): 81 mg via ORAL
  Filled 2014-02-03 (×12): qty 1

## 2014-02-03 MED ORDER — CYCLOBENZAPRINE HCL 10 MG PO TABS
10.0000 mg | ORAL_TABLET | Freq: Three times a day (TID) | ORAL | Status: DC | PRN
  Administered 2014-02-04 – 2014-02-09 (×9): 10 mg via ORAL
  Filled 2014-02-03 (×12): qty 1

## 2014-02-03 MED ORDER — CYCLOBENZAPRINE HCL 10 MG PO TABS
ORAL_TABLET | ORAL | Status: AC
  Administered 2014-02-03: 10 mg
  Filled 2014-02-03: qty 1

## 2014-02-03 MED ORDER — SURGIFOAM 100 EX MISC
CUTANEOUS | Status: DC | PRN
  Administered 2014-02-03: 15:00:00 via TOPICAL

## 2014-02-03 MED ORDER — MENTHOL 3 MG MT LOZG: 1.0000 | LOZENGE | OROMUCOSAL | Status: DC | PRN

## 2014-02-03 MED ORDER — METFORMIN HCL 500 MG PO TABS
1000.0000 mg | ORAL_TABLET | Freq: Two times a day (BID) | ORAL | Status: DC
  Administered 2014-02-04 – 2014-02-09 (×11): 1000 mg via ORAL
  Filled 2014-02-03 (×11): qty 2

## 2014-02-03 MED ORDER — MUPIROCIN 2 % EX OINT
1.0000 "application " | TOPICAL_OINTMENT | Freq: Two times a day (BID) | CUTANEOUS | Status: AC
  Administered 2014-02-03 – 2014-02-08 (×10): 1 via NASAL
  Filled 2014-02-03: qty 22

## 2014-02-03 MED ORDER — NEOSTIGMINE METHYLSULFATE 10 MG/10ML IV SOLN
INTRAVENOUS | Status: DC | PRN
  Administered 2014-02-03: 4 mg via INTRAVENOUS

## 2014-02-03 MED ORDER — GLIPIZIDE 5 MG PO TABS
5.0000 mg | ORAL_TABLET | Freq: Two times a day (BID) | ORAL | Status: DC
  Administered 2014-02-04 – 2014-02-09 (×11): 5 mg via ORAL
  Filled 2014-02-03 (×12): qty 1

## 2014-02-03 MED ORDER — OXYCODONE-ACETAMINOPHEN 5-325 MG PO TABS
1.0000 | ORAL_TABLET | ORAL | Status: DC | PRN
  Administered 2014-02-04 – 2014-02-09 (×19): 2 via ORAL
  Filled 2014-02-03 (×16): qty 2

## 2014-02-03 MED ORDER — GABAPENTIN 400 MG PO CAPS
400.0000 mg | ORAL_CAPSULE | Freq: Three times a day (TID) | ORAL | Status: DC
  Administered 2014-02-03 – 2014-02-09 (×17): 400 mg via ORAL
  Filled 2014-02-03 (×17): qty 1

## 2014-02-03 MED ORDER — LACTATED RINGERS IV SOLN
INTRAVENOUS | Status: DC
  Administered 2014-02-03: 11:00:00 via INTRAVENOUS

## 2014-02-03 MED ORDER — ACETAMINOPHEN 650 MG RE SUPP: 650.0000 mg | RECTAL | Status: DC | PRN

## 2014-02-03 MED ORDER — BUPIVACAINE HCL (PF) 0.5 % IJ SOLN
INTRAMUSCULAR | Status: DC | PRN
  Administered 2014-02-03: 30 mL

## 2014-02-03 MED ORDER — SENNA 8.6 MG PO TABS
1.0000 | ORAL_TABLET | Freq: Two times a day (BID) | ORAL | Status: DC
  Administered 2014-02-04 – 2014-02-09 (×11): 8.6 mg via ORAL
  Filled 2014-02-03 (×8): qty 1

## 2014-02-03 MED ORDER — PROPOFOL 10 MG/ML IV BOLUS
INTRAVENOUS | Status: AC
  Filled 2014-02-03: qty 20

## 2014-02-03 MED ORDER — ONDANSETRON HCL 4 MG/2ML IJ SOLN
INTRAMUSCULAR | Status: DC | PRN
  Administered 2014-02-03: 4 mg via INTRAVENOUS

## 2014-02-03 SURGICAL SUPPLY — 74 items
BAG DECANTER FOR FLEXI CONT (MISCELLANEOUS) IMPLANT
BENZOIN TINCTURE PRP APPL 2/3 (GAUZE/BANDAGES/DRESSINGS) IMPLANT
BLADE CLIPPER SURG (BLADE) IMPLANT
BONE MATRIX OSTEOCEL PRO MED (Bone Implant) ×6 IMPLANT
BUR MATCHSTICK NEURO 3.0 LAGG (BURR) ×3 IMPLANT
BUR PRECISION FLUTE 5.0 (BURR) ×3 IMPLANT
CAGE COROENT 10X9X23 (Cage) ×12 IMPLANT
CANISTER SUCT 3000ML (MISCELLANEOUS) ×3 IMPLANT
CLOSURE WOUND 1/2 X4 (GAUZE/BANDAGES/DRESSINGS)
CONT SPEC 4OZ CLIKSEAL STRL BL (MISCELLANEOUS) ×3 IMPLANT
COVER BACK TABLE 60X90IN (DRAPES) ×3 IMPLANT
DECANTER SPIKE VIAL GLASS SM (MISCELLANEOUS) ×3 IMPLANT
DERMABOND ADVANCED (GAUZE/BANDAGES/DRESSINGS) ×2
DERMABOND ADVANCED .7 DNX12 (GAUZE/BANDAGES/DRESSINGS) ×1 IMPLANT
DRAPE C-ARM 42X72 X-RAY (DRAPES) ×3 IMPLANT
DRAPE C-ARMOR (DRAPES) ×3 IMPLANT
DRAPE LAPAROTOMY 100X72X124 (DRAPES) ×3 IMPLANT
DRAPE POUCH INSTRU U-SHP 10X18 (DRAPES) ×3 IMPLANT
DRAPE SURG 17X23 STRL (DRAPES) IMPLANT
DRSG OPSITE POSTOP 4X6 (GAUZE/BANDAGES/DRESSINGS) ×3 IMPLANT
DRSG TELFA 3X8 NADH (GAUZE/BANDAGES/DRESSINGS) IMPLANT
DURAPREP 26ML APPLICATOR (WOUND CARE) ×3 IMPLANT
ELECT REM PT RETURN 9FT ADLT (ELECTROSURGICAL) ×3
ELECTRODE REM PT RTRN 9FT ADLT (ELECTROSURGICAL) ×1 IMPLANT
GAUZE SPONGE 4X4 12PLY STRL (GAUZE/BANDAGES/DRESSINGS) IMPLANT
GAUZE SPONGE 4X4 16PLY XRAY LF (GAUZE/BANDAGES/DRESSINGS) ×3 IMPLANT
GLOVE BIO SURGEON STRL SZ 6.5 (GLOVE) ×2 IMPLANT
GLOVE BIO SURGEONS STRL SZ 6.5 (GLOVE) ×1
GLOVE BIOGEL PI IND STRL 6.5 (GLOVE) ×1 IMPLANT
GLOVE BIOGEL PI IND STRL 7.0 (GLOVE) ×1 IMPLANT
GLOVE BIOGEL PI IND STRL 7.5 (GLOVE) ×2 IMPLANT
GLOVE BIOGEL PI INDICATOR 6.5 (GLOVE) ×2
GLOVE BIOGEL PI INDICATOR 7.0 (GLOVE) ×2
GLOVE BIOGEL PI INDICATOR 7.5 (GLOVE) ×4
GLOVE ECLIPSE 6.5 STRL STRAW (GLOVE) ×6 IMPLANT
GLOVE ECLIPSE 7.5 STRL STRAW (GLOVE) ×9 IMPLANT
GLOVE EXAM NITRILE LRG STRL (GLOVE) IMPLANT
GLOVE EXAM NITRILE MD LF STRL (GLOVE) IMPLANT
GLOVE EXAM NITRILE XL STR (GLOVE) IMPLANT
GLOVE EXAM NITRILE XS STR PU (GLOVE) IMPLANT
GLOVE SURG SS PI 7.0 STRL IVOR (GLOVE) ×3 IMPLANT
GOWN STRL REUS W/ TWL LRG LVL3 (GOWN DISPOSABLE) ×3 IMPLANT
GOWN STRL REUS W/ TWL XL LVL3 (GOWN DISPOSABLE) ×2 IMPLANT
GOWN STRL REUS W/TWL 2XL LVL3 (GOWN DISPOSABLE) IMPLANT
GOWN STRL REUS W/TWL LRG LVL3 (GOWN DISPOSABLE) ×6
GOWN STRL REUS W/TWL XL LVL3 (GOWN DISPOSABLE) ×4
KIT BASIN OR (CUSTOM PROCEDURE TRAY) ×3 IMPLANT
KIT POSITION SURG JACKSON T1 (MISCELLANEOUS) ×3 IMPLANT
KIT ROOM TURNOVER OR (KITS) ×3 IMPLANT
LIQUID BAND (GAUZE/BANDAGES/DRESSINGS) ×3 IMPLANT
MILL MEDIUM DISP (BLADE) ×3 IMPLANT
NEEDLE HYPO 25X1 1.5 SAFETY (NEEDLE) ×3 IMPLANT
NEEDLE SPNL 18GX3.5 QUINCKE PK (NEEDLE) ×3 IMPLANT
NS IRRIG 1000ML POUR BTL (IV SOLUTION) ×6 IMPLANT
PACK LAMINECTOMY NEURO (CUSTOM PROCEDURE TRAY) ×3 IMPLANT
PAD ARMBOARD 7.5X6 YLW CONV (MISCELLANEOUS) ×9 IMPLANT
ROD PLIF MAS 65MM (Rod) ×6 IMPLANT
SCREW LOCK (Screw) ×12 IMPLANT
SCREW LOCK FXNS SPNE MAS PL (Screw) ×6 IMPLANT
SCREW SHANK 5.5X30MM (Screw) ×6 IMPLANT
SCREW SHANKS 5.5X35 (Screw) ×12 IMPLANT
SCREW TULIP 5.5 (Screw) ×18 IMPLANT
SPONGE LAP 4X18 X RAY DECT (DISPOSABLE) IMPLANT
SPONGE SURGIFOAM ABS GEL 100 (HEMOSTASIS) ×3 IMPLANT
STRIP CLOSURE SKIN 1/2X4 (GAUZE/BANDAGES/DRESSINGS) IMPLANT
SUT PROLENE 6 0 BV (SUTURE) IMPLANT
SUT VIC AB 0 CT1 18XCR BRD8 (SUTURE) ×2 IMPLANT
SUT VIC AB 0 CT1 8-18 (SUTURE) ×4
SUT VIC AB 2-0 CT1 18 (SUTURE) ×3 IMPLANT
SUT VIC AB 3-0 SH 8-18 (SUTURE) ×6 IMPLANT
SYR 20ML ECCENTRIC (SYRINGE) ×3 IMPLANT
TOWEL OR 17X24 6PK STRL BLUE (TOWEL DISPOSABLE) ×3 IMPLANT
TOWEL OR 17X26 10 PK STRL BLUE (TOWEL DISPOSABLE) ×3 IMPLANT
WATER STERILE IRR 1000ML POUR (IV SOLUTION) ×3 IMPLANT

## 2014-02-03 NOTE — Op Note (Signed)
02/03/2014  7:54 PM  PATIENT:  Joy Solis  48 y.o. 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, 4/5  POST-OPERATIVE DIAGNOSIS:  degenerative spondylolisthesis L3/4,4/5  PROCEDURE:  Procedure(s): Decompression L3, L4 nerve roots in the lateral recess and foramina beyond the exposure needed for interbody cage placement LUMBAR THREE-FOUR,LUMBAR FOUR-FIVE POSTERIOR LUMBAR INTERBODY FUSION 50mm x 16mm x 4 degrees x4 Peek cages, 2 cages at 3/4, 2 at 4/5, morselized autograft and allograft Posterolateral arthrodesis allograft, and autograft L3-5 Segmental pedicle screw fixation L3-5, Helix(nuvasive) screws, 5.47mm4x 70mm(L3, L4), 5.51mm x 41mm(L5)  SURGEON:  Surgeon(s): Ashok Pall, MD  ASSISTANTS:Elsner, Mallie Mussel  ANESTHESIA:   general  EBL:  Total I/O In: 1040 [I.V.:600; Blood:190; IV Piggyback:250] Out: 30 [Urine:30]  BLOOD ADMINISTERED:190 CC PRBC  CELL SAVER GIVEN:yes  COUNT:per nursing  DRAINS: none   SPECIMEN:  No Specimen  DICTATION: Joy Solis is a 48 y.o. female whom was taken to the operating room intubated, and placed under a general anesthetic without difficulty. A foley catheter was placed under sterile conditions. She was positioned prone on a Jackson stable with all pressure points properly padded.  Her lumbar region was prepped and draped in a sterile manner. I infiltrated 10cc's 1/2%lidocaine/1:2000,000 strength epinephrine into the planned incision. I opened the skin with a 10 blade and took the incision down to the thoracolumbar fascia. I exposed the lamina of L3,4,and 5 in a subperiosteal fashion bilaterally. I confirmed my location with an intraoperative xray.  I placed self retaining retractors and started the decompression.  I decompressed the spinal canal via inferior facetectomies of L3, and L4 bilaterally and partial superior facetectomies of L4, and L5 bilaterally. I did this to expose the lateral recess and foramina. I removed  hypertrophied ligamentum flavum to decompress the canal and the nerve roots. I used the Kerrison punches and the drill to remove the bone. I used the kerrison punches to remove the ligament. Dr. Ellene Route assisted with the decompression.  PLIF's were performed at L3/4, and 4/5 in the same fashion. I opened the disc space with a 15 blade then used a variety of instruments to remove the disc and prepare the space for the arthrodesis. I used curettes, rongeurs, punches, shavers for the disc space, and rasps in the discetomy. I measured the disc space and placed 49mm  Peek cages(nuvasive) into the disc space(s). I placed autograft and allograft into the disc spaces anterior to the cages before placing the cages. I decorticated the lateral bone at L3,4, and 5. I then placed local autograft and allograft on the decorticated surfaces to complete the posterolateral arthrodesis.  I placed pedicle screws at L3,4, and 5 bilaterally, using fluoroscopic guidance. I drilled  pilot holes, then cannulated the pedicle with a drill at each site. I then tapped each pedicle, assessing each site for pedicle violations. No cutouts were appreciated. Screws (Nuvasive Helix) were then placed at each site without difficulty. Final films were performed and all screws appeared to be in good position.  I closed the wound in a layered fashion. I approximated the thoracolumbar fascia, subcutaneous, and subcuticular planes with vicryl sutures. I used Dermabond, and an occlusive bandage for a sterile dressing.     PLAN OF CARE: Admit to inpatient   PATIENT DISPOSITION:  PACU - hemodynamically stable.   Delay start of Pharmacological VTE agent (>24hrs) due to surgical blood loss or risk of bleeding:  yes

## 2014-02-03 NOTE — Transfer of Care (Signed)
Immediate Anesthesia Transfer of Care Note  Patient: Joy Solis  Procedure(s) Performed: Procedure(s): LUMBAR THREE-FOUR,LUMBAR FOUR-FIVE POSTERIOR LUMBAR INTERBODY FUSION WITH INTERBODY PROSTHESIS,POSTERIOR LATERAL ARTHRODESIS AND POSTERIOR SEGMENTAL INSTRUMENTATION (N/A)  Patient Location: PACU  Anesthesia Type:General  Level of Consciousness: awake and alert   Airway & Oxygen Therapy: Patient Spontanous Breathing and Patient connected to nasal cannula oxygen  Post-op Assessment: Report given to PACU RN and Post -op Vital signs reviewed and stable  Post vital signs: Reviewed and stable  Complications: No apparent anesthesia complications

## 2014-02-03 NOTE — H&P (Signed)
BP 141/79 mmHg  Pulse 95  Temp(Src) 97.1 F (36.2 C) (Oral)  Resp 20  Wt 85.73 kg (189 lb)  SpO2 99%  LMP 01/10/2014 Joy Solis comes in today for evaluation of pain that she has in her back, in the buttocks, in the hip, and lower extremities. She has had this now for about one year. Joy Solis did not have antecedent trauma, but she said that this has caused great deal of discomfort to stand and sit. She can barely make it through the nights without having to wake up. Joy Solis denies any bowel or bladder dysfunction. Joy Solis vital signs are as follows, height 59.5 inches, weight 198.6 pounds, BMI is 39.44, blood pressure is 116/78, pulse is 108, respiratory rate 20, temperature 97.4. On her pain chart, she lists pain in the right upper extremity,numbness and pain in the left upper extremity, pain in the lower back and in both lower extremities. She said that this has gone on for at least three years. REVIEW OF SYSTEMS: Positive for night sweats, eye glasses, hypertension, hypercholesterolemia, leg pain with walking, back pain, arm pain, leg pain, arthritis, and diabetes. She denies allergic, hematologic, psychiatric, neurologic, skin, genitourinary, gastrointestinal, respiratory, ears, nose, throat, or mouth problems. PAST MEDICAL HISTORY:  Current Medical Conditions: Includes hypertension and diabetes. In her own word, she has weakness allover. Numbness and tingling in the hand, leg, and back.  Prior Operations: She has undergone foot surgery, hand surgery, and left knee surgery.  Medications and Allergies: Medications are aspirin, atorvastatin, Forxiga, gabapentin, glipizide, hydrochlorothiazide, metformin, and tramadol. No known drug allergies. SOCIAL HISTORY: She does smoke. She does not use alcohol. She does have a history of substance abuse. PHYSICAL EXAMINATION: On physical examination, she is alert and oriented x4 and answers all questions appropriately. Memory, language,  attention span and fund of knowledge are normal. Pupils are equal, round and reactive to light. Full extraocular movements. Full visual fields. Hearing intact to voice bilaterally. Uvula elevates in midline. Shoulder shrug is normal. Tongue protrudes in the midline. She has tenderness along the right side of the face and in the mandibular area right around the corner of the mouth. She has 5/5 strength in the upper extremities. Good strength in the lower extremities. She can stand on heels and toes. Negative Romberg test. She has great difficulty trying to take a step using her left lower extremity. She cannot step-up using a right lower extremity. She is able to step-up with the left lower extremity. She moves very slowly. She has markedly antalgic gait. Reflexes are 2+ at the biceps, triceps, brachioradialis, at the knees, not elicited at the right ankle, 1+ at the left ankle. Proprioception is intact at the ankle bilaterally. DIAGNOSTIC STUDIES: MRI was reviewed, it shows stenosis fairly severe at 3-4 and 4-5 facet arthropathy of both those levels significant facet arthropathy at L5-S1 on the left side also. She has a large possibly ligamentous on the right side at 3-4. Conus is normal. Cauda equina is otherwise normal. She has fluid in the 3-4 joint. DIAGNOSIS: Lumbar spondylosis, lumbar spondylolisthesis. She has a slight anterolisthesis of L3 on L4. Fairly severe stenosis at 3-4 and 4-5. 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 I do believe that the best operation would be decompression at 3-4 and 4-5 Joy Solis returns after a myelogram and post myelogram CT. DATA: Most of the dye was actually in the epidural space but she is severely stenotic at 3-4 and 4-5. She has a listhesis  3 on 4, grade one. She has a disc herniation at L4-5, but has significant ligamentum flavum hypertrophy contributing to the severe degree of stenosis. She has an instability at 3-4. INTERVAL PFSH: She takes aspirin 81 mg,  atorvastatin, Farxiga, Flexeril, gabapentin, glipizide, hydrochlorothiazide, hydrocodone, metformin, tramadol on occasion. No known drug allergies.   in the midline.  IMPRESSION/PLAN: At this point in time given the fact that she has undergone epidural injections, she's had approximately nine months of conservative treatment without any relief, and she is in a great deal of pain, I have offered and she has agreed to undergo a lumbar fusion at 3-4 and 4-5 with pedicle screws and decompression. Risks and benefits were discussed and she was given a detailed instruction sheet. Bleeding, infection, no relief, weakness in one or both lower extremities all were explained along with other things. I will see her again for her surgery on 02/02/2014.

## 2014-02-03 NOTE — Anesthesia Postprocedure Evaluation (Signed)
  Anesthesia Post-op Note  Patient: Joy Solis  Procedure(s) Performed: Procedure(s): LUMBAR THREE-FOUR,LUMBAR FOUR-FIVE POSTERIOR LUMBAR INTERBODY FUSION WITH INTERBODY PROSTHESIS,POSTERIOR LATERAL ARTHRODESIS AND POSTERIOR SEGMENTAL INSTRUMENTATION (N/A)  Patient Location: PACU  Anesthesia Type: General   Level of Consciousness: awake, alert  and oriented  Airway and Oxygen Therapy: Patient Spontanous Breathing  Post-op Pain: moderate  Post-op Assessment: Post-op Vital signs reviewed  Post-op Vital Signs: Reviewed  Last Vitals:  Filed Vitals:   02/03/14 2138  BP: 133/72  Pulse: 101  Temp: 36.7 C  Resp: 18    Complications: No apparent anesthesia complications

## 2014-02-03 NOTE — Anesthesia Preprocedure Evaluation (Signed)
Anesthesia Evaluation  Patient identified by MRN, date of birth, ID band Patient awake    Reviewed: Allergy & Precautions, H&P , NPO status , Patient's Chart, lab work & pertinent test results  Airway Mallampati: I  TM Distance: >3 FB Neck ROM: Full    Dental   Pulmonary Current Smoker,          Cardiovascular hypertension, Pt. on medications     Neuro/Psych    GI/Hepatic   Endo/Other  diabetes, Type 2, Oral Hypoglycemic Agents  Renal/GU      Musculoskeletal   Abdominal   Peds  Hematology   Anesthesia Other Findings   Reproductive/Obstetrics                             Anesthesia Physical Anesthesia Plan  ASA: II  Anesthesia Plan: General   Post-op Pain Management:    Induction: Intravenous  Airway Management Planned: Oral ETT  Additional Equipment:   Intra-op Plan:   Post-operative Plan: Extubation in OR  Informed Consent: I have reviewed the patients History and Physical, chart, labs and discussed the procedure including the risks, benefits and alternatives for the proposed anesthesia with the patient or authorized representative who has indicated his/her understanding and acceptance.     Plan Discussed with: CRNA and Surgeon  Anesthesia Plan Comments:         Anesthesia Quick Evaluation

## 2014-02-04 LAB — GLUCOSE, CAPILLARY: GLUCOSE-CAPILLARY: 203 mg/dL — AB (ref 70–99)

## 2014-02-04 LAB — HEMOGLOBIN A1C
Hgb A1c MFr Bld: 7.5 % — ABNORMAL HIGH (ref <5.7)
Mean Plasma Glucose: 169 mg/dL — ABNORMAL HIGH (ref <117)

## 2014-02-04 MED ORDER — OXYCODONE-ACETAMINOPHEN 5-325 MG PO TABS
ORAL_TABLET | ORAL | Status: AC
  Filled 2014-02-04: qty 2

## 2014-02-04 MED ORDER — CALAMINE EX LOTN
TOPICAL_LOTION | CUTANEOUS | Status: DC | PRN
  Administered 2014-02-06: 1 via TOPICAL
  Filled 2014-02-04 (×2): qty 118

## 2014-02-04 MED ORDER — HYDROMORPHONE HCL 1 MG/ML IJ SOLN
INTRAMUSCULAR | Status: AC
  Filled 2014-02-04: qty 1

## 2014-02-04 MED ORDER — CALAMINE EX LOTN: TOPICAL_LOTION | Freq: Three times a day (TID) | CUTANEOUS | Status: DC

## 2014-02-04 MED ORDER — POLYETHYLENE GLYCOL 3350 17 G PO PACK
17.0000 g | PACK | Freq: Every day | ORAL | Status: DC
  Administered 2014-02-04 – 2014-02-09 (×6): 17 g via ORAL
  Filled 2014-02-04 (×4): qty 1

## 2014-02-04 MED ORDER — DOCUSATE SODIUM 100 MG PO CAPS
ORAL_CAPSULE | ORAL | Status: AC
  Filled 2014-02-04: qty 2

## 2014-02-04 MED ORDER — HYDROCODONE-ACETAMINOPHEN 5-325 MG PO TABS
ORAL_TABLET | ORAL | Status: AC
  Filled 2014-02-04: qty 1

## 2014-02-04 MED ORDER — DOCUSATE SODIUM 100 MG PO CAPS
100.0000 mg | ORAL_CAPSULE | Freq: Two times a day (BID) | ORAL | Status: DC
  Administered 2014-02-04 – 2014-02-09 (×11): 100 mg via ORAL
  Filled 2014-02-04 (×8): qty 1

## 2014-02-04 MED ORDER — SENNA 8.6 MG PO TABS
ORAL_TABLET | ORAL | Status: AC
  Filled 2014-02-04: qty 1

## 2014-02-04 MED ORDER — SENNA 8.6 MG PO TABS
ORAL_TABLET | ORAL | Status: AC
Start: 2014-02-04 — End: 2014-02-05
  Filled 2014-02-04: qty 1

## 2014-02-04 MED ORDER — WHITE PETROLATUM GEL
Status: AC
  Filled 2014-02-04: qty 5

## 2014-02-04 MED ORDER — POLYETHYLENE GLYCOL 3350 17 G PO PACK
PACK | ORAL | Status: AC
  Filled 2014-02-04: qty 1

## 2014-02-04 NOTE — Progress Notes (Signed)
Patient ID: Joy Solis, female   DOB: 02-26-1966, 48 y.o.   MRN: [id number] BP 115/70 mmHg  Pulse 81  Temp(Src) 98 F (36.7 C) (Oral)  Resp 18  Ht 5' (1.524 m)  Wt 88.905 kg (196 lb)  BMI 38.28 kg/m2  SpO2 98%  LMP 01/10/2014 Alert and oriented x 4, speech is clear and fluent Wound is clean, dry, no signs of infection Moving all extremities well Excoriations in gluteal crease and buttocks Doing well overall. Have prescribed calamine lotion.

## 2014-02-04 NOTE — Progress Notes (Signed)
Orthopedic Tech Progress Note Patient Details:  Joy Solis [birth year] [id number] Brace completed by [employer] vendor. Patient ID: Joy Solis, female   DOB: Oct 14, 1965, 48 y.o.   MRN: [id number]   Braulio Bosch 02/04/2014, 9:30 PM

## 2014-02-04 NOTE — Plan of Care (Signed)
Problem: Phase I Progression Outcomes Goal: Pain controlled with appropriate interventions Outcome: Progressing IV and PO medications for pain control at this time Goal: Log roll for position change Outcome: Completed/Met Date Met:  02/04/14  Problem: Phase II Progression Outcomes Goal: Tolerating diet Outcome: Completed/Met Date Met:  02/04/14  Problem: Discharge Progression Outcomes Goal: Tolerates diet Outcome: Completed/Met Date Met:  02/04/14

## 2014-02-04 NOTE — Progress Notes (Addendum)
PT Cancellation Note  Patient Details Name: Joy Solis MRN: [id number] DOB: 03/15/1965   Cancelled Treatment:    Reason Eval/Treat Not Completed: Other (comment) (awaiting order for back brace, RN paged MD. )  Addendum: @ 14:46 called Dr. Lacy Duverney office, they reported he is in surgery today, so called the neuro OR and left him a message: re- brace.    Barbarann Ehlers [place], [place], DPT [phone]   02/04/2014, 12:29 PM

## 2014-02-04 NOTE — Progress Notes (Signed)
OT EVALUATION  OT evaluation completed in paper chart. Patient reports "they measured me for a brace". Pt currently without orders for a brace and no brace present in the room. Pt evaluated at bed level and back education completed with handout.   Jeri Modena   OTR/L Pager: [phone] Office: [phone] .

## 2014-02-04 NOTE — Progress Notes (Signed)
Orth called for patient's brace.

## 2014-02-04 NOTE — Progress Notes (Signed)
Orthopedic Tech Progress Note Patient Details:  Joy Solis 1965-04-02 [id number] Called [employer] for brace order. Patient ID: Joy Solis, female   DOB: Oct 22, 1965, 48 y.o.   MRN: [id number]   Braulio Bosch 02/04/2014, 3:46 PM

## 2014-02-04 NOTE — Progress Notes (Signed)
Patient has a redness to the face that was noted from after surgery. Patient has a concern regarding that. MD called to notify and order for back brace requested as patient said MD wants her to have one.

## 2014-02-04 NOTE — Progress Notes (Signed)
Inpatient Diabetes Program Recommendations  AACE/ADA: New Consensus Statement on Inpatient Glycemic Control (2013)  Target Ranges:  Prepandial:   less than 140 mg/dL      Peak postprandial:   less than 180 mg/dL (1-2 hours)      Critically ill patients:  140 - 180 mg/dL   Results for LARESA, OSHIRO (MRN [id number]) as of 02/04/2014 10:58  Ref. Range 02/03/2014 10:38 02/03/2014 20:42 02/03/2014 21:51  Glucose-Capillary Latest Range: 70-99 mg/dL 161 (H) 222 (H) 192 (H)   Diabetes history: DM2 Outpatient Diabetes medications: Farxiga 10 mg daily, Glipizide 5 mg BID, Metformin 1000 mg BID Current orders for Inpatient glycemic control: Farxiga 10 mg daily, Glipizide 5 mg BID, Metformin 1000 mg BID, Novolog 0-5 units HS  Inpatient Diabetes Program Recommendations Correction (SSI): Currently only ordered Novolog bedtime correction. Please consider ordering Novolog correction TID wtih meals.  Thanks, Barnie Alderman, RN, MSN, CCRN, CDE Diabetes Coordinator Inpatient Diabetes Program [phone] (Team Pager) [phone] (AP office) [phone] [facility] office)

## 2014-02-04 NOTE — Evaluation (Signed)
Physical Therapy Evaluation Patient Details Name: Joy Solis MRN: [id number] DOB: 24-Jun-1965 Today's Date: 02/04/2014   History of Present Illness  48 y.o. female admitted to [facility] on 02/03/14 for elective L4/5 PLIF.  Pt with significant PMHx ofHTN, Dm, dysrhythmia, trigeminal neuralgia, L knee arthroscopy ('10), Righ hand, right ankle, and right foot surgery.   Clinical Impression  Pt is POD #1 and is moving well.  She is overall, min assist but was limited by pain to just transferring OOB to the recliner chair with RW this afternoon.  She is very anxious about going home with "people who are not trained to help me" I reassured her that we could see if Dr. Christella Noa was open to having home therapists come out and teach her and her family the proper way of assisting her and further education on her back precautions and mobility.  Based on her assist today, I anticipate that she will be able to go home with family's assist and HHPT at discharge.   PT to follow acutely for deficits listed below.       Follow Up Recommendations Home health PT    Equipment Recommendations  None recommended by PT    Recommendations for Other Services   NA    Precautions / Restrictions Precautions Precautions: Back Precaution Booklet Issued: Yes (comment) Precaution Comments: back handout given by OT, reviewed verbally by PT Required Braces or Orthoses: Spinal Brace Spinal Brace: Lumbar corset;Applied in sitting position      Mobility  Bed Mobility Overal bed mobility: Needs Assistance Bed Mobility: Rolling;Sidelying to Sit Rolling: Min assist Sidelying to sit: Min assist       General bed mobility comments: Min assist to roll by supporting her pelvis with draw pad.  Verbal cues for log roll technique.  Min assist to support trunk during transition to sitting.  Pt relying on bed rail for leverage throughout bed mobility.   Transfers Overall transfer level: Needs assistance Equipment used: Rolling  walker (2 wheeled) Transfers: Sit to/from [facility] Sit to Stand: Min assist Stand pivot transfers: Min assist       General transfer comment: Min assist to stand from normal height bed, but this was like standing from elevated surface as pt is 5' or less tall.  Verbal cues for safe hand palcement and words of encouragement throughout.          Balance Overall balance assessment: Needs assistance Sitting-balance support: Feet supported;Bilateral upper extremity supported Sitting balance-Leahy Scale: Fair Sitting balance - Comments: Bil arms attempting to unweight spine in sitting, guarded   Standing balance support: Bilateral upper extremity supported Standing balance-Leahy Scale: Poor Standing balance comment: needs external assist from PT and RW.                              Pertinent Vitals/Pain Pain Assessment: 0-10 Pain Score: 10-Worst pain ever Pain Location: low back with mobility Pain Descriptors / Indicators: Aching;Burning Pain Intervention(s): Limited activity within patient's tolerance;Monitored during session;Repositioned    Home Living Family/patient expects to be discharged to:: Private residence Living Arrangements: Spouse/significant other Available Help at Discharge: Available 24 hours/day Type of Home: House Home Access: Stairs to enter   [employer] of Steps: 2 Home Layout: One level Home Equipment: Walker - standard;Cane - single point;Bedside commode;Hand held shower head;Shower seat      Prior Function Level of Independence: Independent  Hand Dominance   Dominant Hand: Right    Extremity/Trunk Assessment   Upper Extremity Assessment: Defer to OT evaluation           Lower Extremity Assessment: Generalized weakness      Cervical / Trunk Assessment: Normal  Communication   Communication: No difficulties  Cognition Arousal/Alertness: Awake/alert Behavior During Therapy:  WFL for tasks assessed/performed Overall Cognitive Status: Within Functional Limits for tasks assessed                               Assessment/Plan    PT Assessment Patient needs continued PT services  PT Diagnosis Difficulty walking;Abnormality of gait;Generalized weakness;Acute pain   PT Problem List Decreased strength;Decreased activity tolerance;Decreased balance;Decreased mobility;Decreased knowledge of use of DME;Decreased knowledge of precautions;Pain  PT Treatment Interventions DME instruction;Gait training;Stair training;Functional mobility training;Therapeutic activities;Therapeutic exercise;Balance training;Neuromuscular re-education;Cognitive remediation;Patient/family education;Modalities   PT Goals (Current goals can be found in the Care Plan section) Acute Rehab PT Goals Patient Stated Goal: to do everything she is supposed to do to get better.  PT Goal Formulation: With patient Time For Goal Achievement: 02/11/14 Potential to Achieve Goals: Good    Frequency Min 5X/week    End of Session Equipment Utilized During Treatment: Back brace Activity Tolerance: Patient limited by pain Patient left: in chair;with call bell/phone within reach;with family/visitor present Nurse Communication: Mobility status         Time: 7341-9379 PT Time Calculation (min) (ACUTE ONLY): 34 min   Charges:   PT Evaluation $Initial PT Evaluation Tier I: 1 Procedure PT Treatments $Therapeutic Activity: 8-22 mins        Raistlin Gum B. [place], [place], DPT [phone]   02/04/2014, 5:09 PM

## 2014-02-04 NOTE — Progress Notes (Signed)
Orders seen to discontinue foley on post op day 1. This RN not discontinuing foley cath prior to the end of this night shift d/t pt complaining of constipation, distention and discomfort. Risk of urinary retention post removal of foley d/t GI complaints. Foley draining adequate urine output. Will speak w/ day RN regarding this. Will ask day RN to speak w/ Neurosurgeon to discuss ordering additional meds for bowel relief and as to when they request foley to be removed.

## 2014-02-05 LAB — GLUCOSE, CAPILLARY
GLUCOSE-CAPILLARY: 231 mg/dL — AB (ref 70–99)
GLUCOSE-CAPILLARY: 135 mg/dL — AB (ref 70–99)
Glucose-Capillary: 234 mg/dL — ABNORMAL HIGH (ref 70–99)
Glucose-Capillary: 190 mg/dL — ABNORMAL HIGH (ref 70–99)

## 2014-02-05 MED ORDER — DOCUSATE SODIUM 100 MG PO CAPS
ORAL_CAPSULE | ORAL | Status: AC
  Administered 2014-02-05: 10:00:00
  Filled 2014-02-05: qty 1

## 2014-02-05 MED ORDER — HYDROMORPHONE HCL 1 MG/ML IJ SOLN
INTRAMUSCULAR | Status: AC
  Administered 2014-02-05: 09:00:00
  Filled 2014-02-05: qty 1

## 2014-02-05 MED ORDER — OXYCODONE-ACETAMINOPHEN 5-325 MG PO TABS
ORAL_TABLET | ORAL | Status: AC
  Filled 2014-02-05: qty 2

## 2014-02-05 MED ORDER — POLYETHYLENE GLYCOL 3350 17 G PO PACK
PACK | ORAL | Status: AC
  Administered 2014-02-05: 10:00:00
  Filled 2014-02-05: qty 1

## 2014-02-05 MED ORDER — SENNA 8.6 MG PO TABS
ORAL_TABLET | ORAL | Status: AC
  Administered 2014-02-05: 10:00:00
  Filled 2014-02-05: qty 1

## 2014-02-05 MED ORDER — BISACODYL 10 MG RE SUPP
10.0000 mg | Freq: Every day | RECTAL | Status: DC | PRN
  Administered 2014-02-05: 10 mg via RECTAL
  Filled 2014-02-05: qty 1

## 2014-02-05 MED ORDER — HYDROMORPHONE HCL 1 MG/ML IJ SOLN
INTRAMUSCULAR | Status: AC
  Filled 2014-02-05: qty 1

## 2014-02-05 NOTE — Plan of Care (Signed)
Problem: Phase I Progression Outcomes Goal: Pain controlled with appropriate interventions Outcome: Progressing Goal: OOB as tolerated unless otherwise ordered Outcome: Completed/Met Date Met:  02/05/14 Goal: Initial discharge plan identified Outcome: Completed/Met Date Met:  02/05/14 Goal: PT/OT consults requested Outcome: Completed/Met Date Met:  02/05/14 Goal: Other Phase I Outcomes/Goals Outcome: Not Applicable Date Met:  44/97/53

## 2014-02-05 NOTE — Progress Notes (Signed)
Physical Therapy Treatment Patient Details Name: Joy Solis MRN: [id number] DOB: 1965-05-01 Today's Date: March 04, 2014    History of Present Illness 48 y.o. female admitted to [facility] on 02/03/14 for elective L4/5 PLIF.  Pt with significant PMHx ofHTN, Dm, dysrhythmia, trigeminal neuralgia, L knee arthroscopy ('10), Righ hand, right ankle, and right foot surgery.     PT Comments    Pt making steady progress with mobility. At this rate pt should continue to be able to go home with family assistance post acute stay.  Follow Up Recommendations  Home health PT     Equipment Recommendations  None recommended by PT       Precautions / Restrictions Precautions Precautions: Back Required Braces or Orthoses: Spinal Brace Spinal Brace: Lumbar corset;Applied in sitting position (min assist with cues)    Mobility  Bed Mobility     Rolling: Min guard Sidelying to sit: Min assist       General bed mobility comments: Bed flat with not rails used to simulate home enviroment. Cues on techique needed as well to maintain back precautions. Increased time needed with mobility.  Transfers Overall transfer level: Needs assistance Equipment used: Rolling walker (2 wheeled) Transfers: Sit to/from Stand Sit to Stand: Min assist Stand pivot transfers: Min guard       General transfer comment: cues on hand placement and anterior weight shift with standing.  Ambulation/Gait Ambulation/Gait assistance: Min guard Ambulation Distance (Feet): 40 Feet Assistive device: Rolling walker (2 wheeled) Gait Pattern/deviations: Step-through pattern;Decreased stride length;Narrow base of support Gait velocity: decreased Gait velocity interpretation: Below normal speed for age/gender     Stairs            Wheelchair Mobility    Modified Rankin (Stroke Patients Only)          Cognition Arousal/Alertness: Awake/alert Behavior During Therapy: WFL for tasks assessed/performed Overall  Cognitive Status: Within Functional Limits for tasks assessed                       Pertinent Vitals/Pain Pain Assessment: 0-10 Pain Score: 6  Pain Location: low back with movement Pain Descriptors / Indicators: Aching;Sore Pain Intervention(s): Limited activity within patient's tolerance;Monitored during session;Premedicated before session;Repositioned     PT Goals (current goals can now be found in the care plan section) Acute Rehab PT Goals Patient Stated Goal: to do everything she is supposed to do to get better.  PT Goal Formulation: With patient Time For Goal Achievement: 02/11/14 Potential to Achieve Goals: Good Progress towards PT goals: Progressing toward goals    Frequency  Min 5X/week    PT Plan Current plan remains appropriate       End of Session Equipment Utilized During Treatment: Back brace Activity Tolerance: Patient tolerated treatment well Patient left: in chair;with call bell/phone within reach;with chair alarm set     Time: 0936-1000 PT Time Calculation (min) (ACUTE ONLY): 24 min  Charges:  $Gait Training: 8-22 mins $Therapeutic Activity: 8-22 mins                    G Codes:      [name] 03/04/14, 10:38 AM   [name], PTA, CLT Acute Rehab Services Office- [phone]  10:40 AM

## 2014-02-05 NOTE — Progress Notes (Signed)
Patient ID: Joy Solis, female   DOB: 24-Oct-1965, 48 y.o.   MRN: [id number] Afeb, vss No new neuro issues. Slowly increasing activity. Has appropriate back soreness. Continue present rx.

## 2014-02-05 NOTE — Progress Notes (Addendum)
Occupational Therapy Treatment Patient Details Name: SHALEIGH LAUBSCHER MRN: [id number] DOB: 01-05-1966 Today's Date: 02/05/2014    History of present illness 48 y.o. female admitted to [facility] on 02/03/14 for elective L4/5 PLIF.  Pt with significant PMHx ofHTN, Dm, dysrhythmia, trigeminal neuralgia, L knee arthroscopy ('10), Righ hand, right ankle, and right foot surgery.    OT comments  Education provided during session. Updated recommendations to [facility] as pt unsure who can assist her at d/c.   Follow Up Recommendations  Home health OT;Supervision - Intermittent    Equipment Recommendations  3 in 1 bedside comode    Recommendations for Other Services      Precautions / Restrictions Precautions Precautions: Back Precaution Comments: reviewed precautions Required Braces or Orthoses: Spinal Brace Spinal Brace: Lumbar corset;Applied in sitting position Restrictions Weight Bearing Restrictions: No       Mobility Bed Mobility Overal bed mobility: Needs Assistance Bed Mobility: Rolling;Sidelying to Sit Rolling: Min assist Sidelying to sit: Supervision       General bed mobility comments: Min A to roll.  Transfers Overall transfer level: Needs assistance Equipment used: Rolling walker (2 wheeled) Transfers: Sit to/from Stand Sit to Stand: Min guard         General transfer comment: cues for technique/hand placement.    Balance                                   ADL Overall ADL's : Needs assistance/impaired     Grooming: Oral care;Standing;Min guard           Upper Body Dressing : Set up;Supervision/safety;Sitting (back brace)       Toilet Transfer: Min guard;Ambulation;RW;Comfort height toilet;Grab bars   Toileting- Clothing Manipulation and Hygiene: Min guard (standing/sitting) Toileting - Clothing Manipulation Details (indicate cue type and reason): performed but cues for precautions as pt breaking them     Functional mobility during ADLs: Min  guard;Rolling walker (ambulated just outside of room-pt moves slow; cues for technique) General ADL Comments: Legs appeared to be getting weak while standing at sink. Educated on use of cup for oral crea and placement of grooming items to avoid breaking precautions. Educated on techniques for LB ADLs-Pt able to cross legs over knees, but she felt as if she was straining and pulling quite a bit of weight to cross one of her legs over knee. Educated on [place]. Educated on what pt could use for hygiene (tongs and wipes/paper). Educated on safety (safe shoewear, use of bag on walker, rugs). Educated on tub transfer techniques and options for shower chair (explained technique of backing to chair and swinging legs in is same as car transfer technique). Educated on back brace.      Vision                     Perception     Praxis      Cognition  Awake/Alert Behavior During Therapy: WFL for tasks assessed/performed Overall Cognitive Status: Within Functional Limits for tasks assessed                       Extremity/Trunk Assessment               Exercises     Shoulder Instructions       General Comments      Pertinent Vitals/ Pain       Pain Assessment: 0-10 Pain  Score: 9  Pain Location: back Pain Descriptors / Indicators: Other (Comment);Burning;Aching (stinging) Pain Intervention(s): Repositioned;Limited activity within patient's tolerance;Monitored during session  Home Living                                          Prior Functioning/Environment              Frequency Min 2X/week     Progress Toward Goals  OT Goals(current goals can now be found in the care plan section)  Progress towards OT goals: Progressing toward goals  Acute Rehab OT Goals Patient Stated Goal: not stated OT Goal Formulation: With patient Potential to Achieve Goals: Good ADL Goals Pt Will Perform Grooming: with set-up;standing Pt Will Perform Lower  Body Bathing: with supervision;sit to/from stand Pt Will Perform Lower Body Dressing: with supervision;sit to/from stand Pt Will Transfer to Toilet: with supervision;bedside commode Pt Will Perform Tub/Shower Transfer: with supervision;shower seat;Tub transfer;ambulating Additional ADL Goal #1: Pt will verbalize back precautions 3 out 3 precautions  Plan Discharge plan needs to be updated    Co-evaluation                 End of Session Equipment Utilized During Treatment: Gait belt;Rolling walker;Back brace   Activity Tolerance Patient limited by pain   Patient Left in chair;with call bell/phone within reach;with chair alarm set   Nurse Communication Other (comment) (saw pt ambulating)        Time: 3009-2330 OT Time Calculation (min): 30 min  Charges: OT General Charges $OT Visit: 1 Procedure OT Treatments $Self Care/Home Management : 8-22 mins $Therapeutic Activity: 8-22 mins  Benito Mccreedy OTR/L [phone] 02/05/2014, 3:32 PM

## 2014-02-06 LAB — GLUCOSE, CAPILLARY
GLUCOSE-CAPILLARY: 145 mg/dL — AB (ref 70–99)
GLUCOSE-CAPILLARY: 209 mg/dL — AB (ref 70–99)
Glucose-Capillary: 151 mg/dL — ABNORMAL HIGH (ref 70–99)
Glucose-Capillary: 126 mg/dL — ABNORMAL HIGH (ref 70–99)

## 2014-02-06 MED ORDER — WHITE PETROLATUM GEL
Status: AC
  Administered 2014-02-06: 0.2
  Filled 2014-02-06: qty 5

## 2014-02-06 NOTE — Progress Notes (Signed)
Subjective: Patient reports doing better today  Objective: Vital signs in last 24 hours: Temp:  [98.1 F (36.7 C)-99 F (37.2 C)] 98.2 F (36.8 C) (12/06 0922) Pulse Rate:  [96-109] 104 (12/06 0922) Resp:  [18-20] 20 (12/06 0922) BP: (100-113)/(47-79) 112/73 mmHg (12/06 0922) SpO2:  [95 %-100 %] 95 % (12/06 0922)  Intake/Output from previous day:   Intake/Output this shift:    Physical Exam: Has been up walking.  Sitting in chair in back brace.  Leg strength good.  Lab Results: No results for input(s): WBC, HGB, HCT, PLT in the last 72 hours. BMET No results for input(s): NA, K, CL, CO2, GLUCOSE, BUN, CREATININE, CALCIUM in the last 72 hours.  Studies/Results: No results found.  Assessment/Plan: Excoriations improving per patient.  Continue to mobilize.  Dr. Christella Noa to plan disposition in am.    LOS: 3 days    Joy Shoals, MD 02/06/2014, 12:18 PM

## 2014-02-07 LAB — GLUCOSE, CAPILLARY
GLUCOSE-CAPILLARY: 167 mg/dL — AB (ref 70–99)
GLUCOSE-CAPILLARY: 146 mg/dL — AB (ref 70–99)
Glucose-Capillary: 134 mg/dL — ABNORMAL HIGH (ref 70–99)
Glucose-Capillary: 127 mg/dL — ABNORMAL HIGH (ref 70–99)

## 2014-02-07 MED ORDER — CYCLOBENZAPRINE HCL 10 MG PO TABS: 10.0000 mg | ORAL_TABLET | Freq: Three times a day (TID) | ORAL | Status: DC | PRN

## 2014-02-07 MED ORDER — MAGNESIUM CITRATE PO SOLN
1.0000 | Freq: Once | ORAL | Status: DC
  Filled 2014-02-07: qty 296

## 2014-02-07 MED ORDER — OXYCODONE-ACETAMINOPHEN 5-325 MG PO TABS: 1.0000 | ORAL_TABLET | Freq: Four times a day (QID) | ORAL | Status: DC | PRN

## 2014-02-07 MED FILL — Sodium Chloride IV Soln 0.9%: INTRAVENOUS | Qty: 3000 | Status: AC

## 2014-02-07 MED FILL — Heparin Sodium (Porcine) Inj 1000 Unit/ML: INTRAMUSCULAR | Qty: 30 | Status: AC

## 2014-02-07 NOTE — Progress Notes (Signed)
Occupational Therapy Treatment Patient Details Name: Joy Solis MRN: [id number] DOB: 02-19-1966 Today's Date: 02/07/2014    History of present illness 48 y.o. female admitted to [facility] on 02/03/14 for elective L4/5 PLIF.  Pt with significant PMHx ofHTN, Dm, dysrhythmia, trigeminal neuralgia, L knee arthroscopy ('10), Righ hand, right ankle, and right foot surgery.    OT comments  Patient progressing very well with mobility and ADL retraining. Pt reports that Dr. Christella Noa is okay with her not wearing the back brace. Pt completed all transfers, ADL tasks, and mobility with modified independence. Pt verbalized 3/3 back precautions and practiced with AE. Will follow acutely to address OT goals.   Follow Up Recommendations  No OT follow up;Supervision - Intermittent    Equipment Recommendations  3 in 1 bedside comode    Recommendations for Other Services      Precautions / Restrictions Precautions Precautions: Back Precaution Comments: Pt verbalized 3/3 precautions. Demostrating good adherence during functional activities Required Braces or Orthoses: Spinal Brace Spinal Brace: Lumbar corset;Applied in sitting position (pt reported Dr. Christella Noa ok with not using brace) Restrictions Weight Bearing Restrictions: No       Mobility Bed Mobility               General bed mobility comments: Pt on toilet on OT arrival.  Transfers Overall transfer level: Modified independent Equipment used: Rolling walker (2 wheeled) Transfers: Sit to/from Stand Sit to Stand: Modified independent (Device/Increase time)         General transfer comment: Good demo of hand placement and RW use. Good adherence to back precautions    Balance Overall balance assessment: Needs assistance Sitting-balance support: No upper extremity supported;Feet supported Sitting balance-Leahy Scale: Fair     Standing balance support: Bilateral upper extremity supported;During functional activity Standing  balance-Leahy Scale: Fair                     ADL Overall ADL's : Needs assistance/impaired     Grooming: Wash/dry face;Modified independent;Standing               Lower Body Dressing: Modified independent   Toilet Transfer: Modified Independent;Ambulation;Regular Toilet;RW   Toileting- Clothing Manipulation and Hygiene: Modified independent;Sit to/from stand       Functional mobility during ADLs: Modified independent;Rolling walker General ADL Comments: Pt practiced with AE for toileting and to help adhere to back precautions. Pt took photos of toilet aide and long-handled sponge for son to purchase. Pt is modified independent with transfers and ADLs. Pt reports that Dr. Christella Noa does not want her to wear a back brace and so she is now only wearing it for 20 minutes 2x/day. Pt with good demo of back precautions and safe hand placement and RW use. Pt with incr SOB during ambulation and while sitting in chair.      Vision                     Perception     Praxis      Cognition   Behavior During Therapy: [facility] for tasks assessed/performed Overall Cognitive Status: Within Functional Limits for tasks assessed                       Extremity/Trunk Assessment               Exercises     Shoulder Instructions       General Comments      Pertinent Vitals/  Pain       Pain Assessment: 0-10 Pain Score: 6  Pain Location: back Pain Intervention(s): Monitored during session;Repositioned  Home Living                                          Prior Functioning/Environment              Frequency Min 2X/week     Progress Toward Goals  OT Goals(current goals can now be found in the care plan section)  Progress towards OT goals: Progressing toward goals  Acute Rehab OT Goals Patient Stated Goal: to stay in hospital until Wednesday OT Goal Formulation: With patient Potential to Achieve Goals: Good ADL Goals Pt Will  Perform Grooming: with set-up;standing Pt Will Perform Lower Body Bathing: with supervision;sit to/from stand Pt Will Perform Lower Body Dressing: with supervision;sit to/from stand Pt Will Transfer to Toilet: with supervision;bedside commode Pt Will Perform Tub/Shower Transfer: with supervision;shower seat;Tub transfer;ambulating Additional ADL Goal #1: Pt will verbalize back precautions 3 out 3 precautions  Plan Discharge plan needs to be updated    Co-evaluation                 End of Session Equipment Utilized During Treatment: Gait belt;Rolling walker   Activity Tolerance Patient tolerated treatment well   Patient Left in chair;with call bell/phone within reach   Nurse Communication Mobility status;Precautions        Time: 9794-8016 OT Time Calculation (min): 15 min  Charges:    Redmond Baseman 02/07/2014, 4:05 PM

## 2014-02-07 NOTE — Progress Notes (Signed)
Patient ID: Joy Solis, female   DOB: [birth year], 48 y.o.   MRN: [id number] BP 107/82 mmHg  Pulse 104  Temp(Src) 98.1 F (36.7 C) (Oral)  Resp 17  Ht 5' (1.524 m)  Wt 88.905 kg (196 lb)  BMI 38.28 kg/m2  SpO2 100%  LMP 01/10/2014 Alert and oriented x 4, speech is clear, not fluent Moving all extremities well Not ready for discharge, will need more time with pt Will give mg citrate for bowels. Discharge meds printed.

## 2014-02-07 NOTE — Progress Notes (Signed)
Physical Therapy Treatment Patient Details Name: Joy Solis MRN: [id number] DOB: 07-15-1965 Today's Date: 02/07/2014    History of Present Illness 48 y.o. female admitted to [facility] on 02/03/14 for elective L4/5 PLIF.  Pt with significant PMHx ofHTN, Dm, dysrhythmia, trigeminal neuralgia, L knee arthroscopy ('10), Righ hand, right ankle, and right foot surgery.     PT Comments    Pt mobilizing very well--adhering to back precautions, able to don brace with set-up, walking 150+ft, and completed stair training. Pt remains very anxious re: return home as she is unsure how much her husband will be present to assist her.    Follow Up Recommendations  Home health PT     Equipment Recommendations  None recommended by PT    Recommendations for Other Services       Precautions / Restrictions Precautions Precautions: Back Precaution Comments: reviewed precautions; pt able to state 2/3 (cues to recall no arching); demonstrated adherence throughout Required Braces or Orthoses: Spinal Brace Spinal Brace: Lumbar corset;Applied in standing position (pt reported Dr. Christella Noa ok with amb to BR with no brace) Restrictions Weight Bearing Restrictions: No    Mobility  Bed Mobility Overal bed mobility: Modified Independent Bed Mobility: Rolling;Sidelying to Sit Rolling: Modified independent (Device/Increase time) Sidelying to sit: Modified independent (Device/Increase time)       General bed mobility comments: using rail to assist on entering room  Transfers Overall transfer level: Needs assistance Equipment used: Rolling walker (2 wheeled) Transfers: Sit to/from Stand Sit to Stand: Supervision         General transfer comment: cues for technique/hand placement.  Ambulation/Gait Ambulation/Gait assistance: Supervision Ambulation Distance (Feet): 155 Feet Assistive device: Rolling walker (2 wheeled) Gait Pattern/deviations: Step-through pattern;Decreased stride length Gait  velocity: significantly decreased   General Gait Details: slow but steady; proper use of RW (despite being a bit tall for her)   Stairs Stairs: Yes Stairs assistance: Min assist Stair Management: No rails;Step to pattern;Forwards;With cane (HHA opposite her cane) Number of Stairs: 2 General stair comments: vc for technique/sequencing; very steady  Wheelchair Mobility    Modified Rankin (Stroke Patients Only)       Balance                                    Cognition Arousal/Alertness: Awake/alert Behavior During Therapy: WFL for tasks assessed/performed Overall Cognitive Status: Within Functional Limits for tasks assessed                      Exercises      General Comments        Pertinent Vitals/Pain Pain Assessment: 0-10 Pain Score: 9  Pain Location: low back Pain Intervention(s): Limited activity within patient's tolerance;Monitored during session;Repositioned;Premedicated before session    Home Living                      Prior Function            PT Goals (current goals can now be found in the care plan section) Acute Rehab PT Goals Patient Stated Goal: to be independent--not sure how much husband will help her Progress towards PT goals: Progressing toward goals    Frequency  Min 5X/week    PT Plan Current plan remains appropriate    Co-evaluation             End of Session Equipment Utilized During Treatment:  Back brace Activity Tolerance: Patient tolerated treatment well Patient left: in chair;with call bell/phone within reach     Time: 0829-0900 PT Time Calculation (min) (ACUTE ONLY): 31 min  Charges:  $Gait Training: 23-37 mins                    G Codes:      Joy Solis 02/12/14, 9:15 AM Pager [phone]

## 2014-02-07 NOTE — Discharge Instructions (Signed)

## 2014-02-08 LAB — GLUCOSE, CAPILLARY
GLUCOSE-CAPILLARY: 156 mg/dL — AB (ref 70–99)
GLUCOSE-CAPILLARY: 80 mg/dL (ref 70–99)
Glucose-Capillary: 206 mg/dL — ABNORMAL HIGH (ref 70–99)
Glucose-Capillary: 162 mg/dL — ABNORMAL HIGH (ref 70–99)

## 2014-02-08 NOTE — Plan of Care (Signed)
Problem: Phase I Progression Outcomes Goal: Hemodynamically stable Outcome: Completed/Met Date Met:  02/08/14  Problem: Phase II Progression Outcomes Goal: Progress activity as tolerated unless otherwise ordered Outcome: Completed/Met Date Met:  02/08/14 Goal: Discharge plan established Outcome: Completed/Met Date Met:  02/08/14 Goal: Verbalizes of donning/doffing brace Outcome: Completed/Met Date Met:  02/08/14 Goal: Understands assist devices with ambulation Outcome: Completed/Met Date Met:  02/08/14 Goal: PT/OT consults completed Outcome: Completed/Met Date Met:  02/08/14  Problem: Phase III Progression Outcomes Goal: Pain controlled on oral analgesia Outcome: Completed/Met Date Met:  02/08/14 Goal: Activity at appropriate level-compared to baseline (UP IN CHAIR FOR HEMODIALYSIS)  Outcome: Completed/Met Date Met:  02/08/14 Goal: Demonstrates donning/doffing brace Outcome: Completed/Met Date Met:  02/08/14 Goal: Demonstrates proper use of assistive devices Outcome: Completed/Met Date Met:  02/08/14 Goal: Discharge plan remains appropriate-arrangements made Outcome: Completed/Met Date Met:  02/08/14

## 2014-02-08 NOTE — Progress Notes (Signed)
Physical Therapy Treatment and Discharge Patient Details Name: Joy Solis MRN: [id number] DOB: April 01, 1965 Today's Date: 02/08/2014    History of Present Illness 48 y.o. female admitted to [facility] on 02/03/14 for elective L4/5 PLIF.  Pt with significant PMHx ofHTN, Dm, dysrhythmia, trigeminal neuralgia, L knee arthroscopy ('10), Righ hand, right ankle, and right foot surgery.     PT Comments    Pt has met all PT goals and is able to move around her room and hallways at modified independent level (with RW). Pt verbalizes no further mobility questions or concerns. Feel a HHPT safety evaluation is warranted as she is anxious re: return home and has not used a RW in her home before. Anticipate it will be a single HHPT visit. No further acute PT needs and signing off.   Follow Up Recommendations  Home health PT     Equipment Recommendations  Rolling walker with 5" wheels (pt only has a standard walker at home)    Recommendations for Other Services       Precautions / Restrictions Precautions Precautions: Back Precaution Comments: Pt verbalized 3/3 precautions. Demostrating good adherence during functional activities Required Braces or Orthoses: Spinal Brace Spinal Brace:  (pt reported Dr. Christella Noa ok with no brace at all) Restrictions Weight Bearing Restrictions: No    Mobility  Bed Mobility Overal bed mobility: Modified Independent Bed Mobility: Rolling;Sidelying to Sit Rolling: Modified independent (Device/Increase time) Sidelying to sit: Modified independent (Device/Increase time)       General bed mobility comments: +rail and incr time  Transfers Overall transfer level: Needs assistance Equipment used: Rolling walker (2 wheeled) Transfers: Sit to/from Stand Sit to Stand: Modified independent (Device/Increase time)         General transfer comment: no cues, safe technique  Ambulation/Gait Ambulation/Gait assistance: Modified independent (Device/Increase  time) Ambulation Distance (Feet): 155 Feet Assistive device: Rolling walker (2 wheeled) Gait Pattern/deviations: Step-through pattern;Decreased stride length Gait velocity: significantly decreased Gait velocity interpretation: Below normal speed for age/gender General Gait Details: slow but steady; proper use of RW (despite being a bit tall for her)   Stairs Stairs: Yes Stairs assistance: Min guard Stair Management: No rails;Step to pattern;Forwards;With cane Number of Stairs: 4 General stair comments: vc for technique/sequencing, however pt has her own way re: strong vs weak leg and could not change her mind; steady and safe  Wheelchair Mobility    Modified Rankin (Stroke Patients Only)       Balance                                    Cognition Arousal/Alertness: Awake/alert Behavior During Therapy: WFL for tasks assessed/performed Overall Cognitive Status: Within Functional Limits for tasks assessed                      Exercises      General Comments        Pertinent Vitals/Pain Pain Assessment: 0-10 Pain Score: 4  Pain Location: back Pain Intervention(s): Limited activity within patient's tolerance;Monitored during session;Premedicated before session;Repositioned    Home Living                      Prior Function            PT Goals (current goals can now be found in the care plan section) Acute Rehab PT Goals Patient Stated Goal: to be independent--not sure how  much husband will help her Progress towards PT goals: Goals met/education completed, patient discharged from PT    Frequency       PT Plan Current plan remains appropriate    Co-evaluation             End of Session   Activity Tolerance: Patient tolerated treatment well Patient left: in chair;with call bell/phone within reach     Time: 1011-1040 PT Time Calculation (min) (ACUTE ONLY): 29 min  Charges:  $Gait Training: 23-37 mins                     G Codes:      Indiana Gamero 25-Feb-2014, 10:47 AM Pager [phone]

## 2014-02-08 NOTE — Progress Notes (Signed)
Talked to patient about DCP/ [place]; patient stated that she does not need any HHPT and that she will be fine at home; DME ordered; RW/ 3:1; Aneta Mins [phone]

## 2014-02-08 NOTE — Addendum Note (Signed)
Addendum  created 02/08/14 1906 by Lillia Abed, MD   Modules edited: Anesthesia Events, Narrator   Narrator:  Narrator: Event Log Edited

## 2014-02-09 LAB — GLUCOSE, CAPILLARY
Glucose-Capillary: 176 mg/dL — ABNORMAL HIGH (ref 70–99)
Glucose-Capillary: 141 mg/dL — ABNORMAL HIGH (ref 70–99)

## 2014-02-09 NOTE — Progress Notes (Signed)
Occupational Therapy Treatment Patient Details Name: Joy Solis MRN: [id number] DOB: 1965/12/20 Today's Date: 02/09/2014    History of present illness 48 y.o. female admitted to [facility] on 02/03/14 for elective L4/5 PLIF.  Pt with significant PMHx ofHTN, Dm, dysrhythmia, trigeminal neuralgia, L knee arthroscopy ('10), Righ hand, right ankle, and right foot surgery.    OT comments  Patient with no further acute OT needs identified. All education has been completed and the patient has no further questions. See below for any follow-up Occupational Therapy or equipment needs. OT to sign off.   Follow Up Recommendations  No OT follow up;Supervision - Intermittent    Equipment Recommendations  3 in 1 bedside comode;Other (comment) (RW-2wheeled)    Recommendations for Other Services      Precautions / Restrictions Precautions Precautions: Back Precaution Comments: Pt verbalized 3/3 precautions. Demostrating good adherence during functional activities Restrictions Weight Bearing Restrictions: No       Mobility Bed Mobility Overal bed mobility: Modified Independent Bed Mobility: Sit to Sidelying         Sit to sidelying: Modified independent (Device/Increase time) General bed mobility comments: HOB flat; no use of bedrails. Required incr time  Transfers Overall transfer level: Modified independent Equipment used: Rolling walker (2 wheeled) Transfers: Sit to/from Stand Sit to Stand: Modified independent (Device/Increase time)         General transfer comment: Good demo of hand placement and adherence to back precautions    Balance Overall balance assessment: Needs assistance Sitting-balance support: No upper extremity supported;Feet supported Sitting balance-Leahy Scale: Good     Standing balance support: Bilateral upper extremity supported;During functional activity Standing balance-Leahy Scale: Fair                     ADL Overall ADL's : Modified  independent;Needs assistance/impaired                                 Tub/ Shower Transfer: Tub transfer;Min guard;Adhering to back precautions;Cueing for sequencing;Ambulation;3 in 1;Rolling walker Tub/Shower Transfer Details (indicate cue type and reason): Verbal cues for sequence of transfer. Min guard (A) for safety Functional mobility during ADLs: Supervision/safety;Rolling walker General ADL Comments: Pt ambulated 22' (to and from rehab gym) to complete tub transfer. Pt completed tub transfer with 3 in 1 and educated pt on position/adjustment to fit 3 in 1 in tub. Educated pt on amount of daily activity beneficial while at home. Pt required 2 verbal cues for safe RW use       Vision                     Perception     Praxis      Cognition   Behavior During Therapy: WFL for tasks assessed/performed Overall Cognitive Status: Within Functional Limits for tasks assessed                       Extremity/Trunk Assessment               Exercises     Shoulder Instructions       General Comments      Pertinent Vitals/ Pain       Pain Assessment: 0-10 Pain Score: 10-Worst pain ever Pain Location: back with mobility Pain Intervention(s): Limited activity within patient's tolerance;Monitored during session;Repositioned  Home Living  Prior Functioning/Environment              Frequency Min 2X/week     Progress Toward Goals  OT Goals(current goals can now be found in the care plan section)  Progress towards OT goals: Goals met/education completed, patient discharged from OT  Acute Rehab OT Goals Patient Stated Goal: to go home OT Goal Formulation: With patient Potential to Achieve Goals: Good ADL Goals Pt Will Perform Grooming: with set-up;standing Pt Will Perform Lower Body Bathing: with supervision;sit to/from stand Pt Will Perform Lower Body Dressing: with  supervision;sit to/from stand Pt Will Transfer to Toilet: with supervision;bedside commode Pt Will Perform Tub/Shower Transfer: with supervision;shower seat;Tub transfer;ambulating Additional ADL Goal #1: Pt will verbalize back precautions 3 out 3 precautions  Plan Discharge plan remains appropriate    Co-evaluation                 End of Session Equipment Utilized During Treatment: Gait belt;Rolling walker   Activity Tolerance Patient tolerated treatment well   Patient Left in bed;with call bell/phone within reach   Nurse Communication Mobility status;Precautions        Time: 9030-1499 OT Time Calculation (min): 27 min  Charges:    Redmond Baseman 02/09/2014, 10:49 AM

## 2014-02-09 NOTE — Progress Notes (Signed)
Patient ID: Joy Solis, female   DOB: [birth year], 48 y.o.   MRN: [id number] Patient ambulatory, constipation resolved ready for discharge. Needs rolling walker and 3 in 1

## 2014-02-09 NOTE — Progress Notes (Signed)
Discharge orders received.  Discharge instructions and follow-up appointments discussed with the patient.  VSS upon discharge.  Education complete.  Transported out via wheelchair family present. Cori Razor, RN

## 2014-03-09 ENCOUNTER — Other Ambulatory Visit: Payer: Self-pay | Admitting: Internal Medicine

## 2014-03-18 NOTE — Discharge Summary (Signed)
Physician Discharge Summary  Patient ID: Joy Solis MRN: [id number] DOB/AGE: 1965-05-16 49 y.o.  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 Lumbar radiculopathy L3-4 L4-5.  Active Problems:   Osteoarthritis of spine with radiculopathy   Discharged Condition: good  Hospital Course: Patient was omitted to undergo surgical decompression and arthrodesis from L3-L5. She tolerated her surgery well.  Consults: None  Significant Diagnostic Studies: None  Treatments: surgery: Decompression L3-4 and L4-5 posterior lumbar interbody arthrodesis. Segmental fixation L3-L5.  Discharge Exam: Blood pressure 106/63, pulse 93, temperature 98 F (36.7 C), temperature source Oral, resp. rate 18, height 5' (1.524 m), weight 88.905 kg (196 lb), last menstrual period 01/10/2014, SpO2 100 %. Incision is clean and dry, motor function is intact in lower extremities.  Disposition: 01-Home or Self Care  Discharge Instructions    Call MD for:  redness, tenderness, or signs of infection (pain, swelling, redness, odor or green/yellow discharge around incision site)    Complete by:  As directed      Call MD for:  severe uncontrolled pain    Complete by:  As directed      Call MD for:  temperature >100.4    Complete by:  As directed      DME Other see comment    Complete by:  As directed   Three in one     Diet - low sodium heart healthy    Complete by:  As directed      For home use only DME 4 wheeled rolling walker with seat    Complete by:  As directed      Increase activity slowly    Complete by:  As directed             Medication List    STOP taking these medications        gabapentin 400 MG capsule  Commonly known as:  NEURONTIN     HYDROcodone-acetaminophen  5-325 MG per tablet  Commonly known as:  NORCO/VICODIN     ibuprofen 200 MG tablet  Commonly known as:  ADVIL,MOTRIN      TAKE these medications        acetaminophen-codeine 300-30 MG per tablet  Commonly known as:  TYLENOL #3  Take 1 tablet by mouth every 4 (four) hours as needed for moderate pain.     aspirin 81 MG tablet  Take 81 mg by mouth daily.     atorvastatin 10 MG tablet  Commonly known as:  LIPITOR  Take 1 tablet (10 mg total) by mouth at bedtime.     BEN GAY GREASELESS 10-15 % greaseless cream  Apply 1 application topically 3 (three) times daily as needed for pain (to knee).     cyclobenzaprine 10 MG tablet  Commonly known as:  FLEXERIL  Take 1 tablet (10 mg total) by mouth 3 (three) times daily as needed for muscle spasms.     diphenhydrAMINE 25 MG tablet  Commonly known as:  SOMINEX  Take 25 mg by mouth at bedtime as needed for sleep.     FARXIGA 10 MG Tabs tablet  Generic drug:  dapagliflozin propanediol  Take 10 mg by mouth daily.     glipiZIDE 5 MG tablet  Commonly known as:  GLUCOTROL  Take 1 tablet (5 mg total) by mouth 2 (two) times daily before a meal.  glucose monitoring kit monitoring kit  1 each by Does not apply route 4 (four) times daily - after meals and at bedtime. 1 month Diabetic Testing Supplies for QAC-QHS accuchecks. Any manufacturer is acceptable.     guaiFENesin-dextromethorphan 100-10 MG/5ML syrup  Commonly known as:  ROBITUSSIN DM  Take 5 mLs by mouth every 4 (four) hours as needed for cough.     hydrochlorothiazide 25 MG tablet  Commonly known as:  HYDRODIURIL  Take 1 tablet (25 mg total) by mouth daily.     Insulin Syringe-Needle U-100 29G X 1/2" 0.3 ML Misc  Commonly known as:  RELION INSULIN SYR .3CC/29G  1 Syringe by Does not apply route as directed.     metFORMIN 1000 MG tablet  Commonly known as:  GLUCOPHAGE  Take 1 tablet (1,000 mg total) by mouth 2 (two) times daily with a meal.     mupirocin ointment 2 %   Commonly known as:  BACTROBAN  Place 1 application into the nose 2 (two) times daily.     oxyCODONE-acetaminophen 5-325 MG per tablet  Commonly known as:  PERCOCET/ROXICET  Take 1-2 tablets by mouth every 6 (six) hours as needed for moderate pain or severe pain.     traMADol 50 MG tablet  Commonly known as:  ULTRAM  Take 50 mg by mouth every 6 (six) hours as needed.           Follow-up Information    Follow up with CABBELL,KYLE L, MD In 3 weeks.   Specialty:  Neurosurgery   Why:  call office to make an appointment   Contact information:   [address] [place] [id number] [phone]       Signed: Earleen Newport 03/18/2014, 2:15 PM

## 2014-03-21 ENCOUNTER — Ambulatory Visit: Payer: Private Health Insurance - Indemnity | Attending: Internal Medicine | Admitting: Internal Medicine

## 2014-03-21 ENCOUNTER — Encounter: Payer: Self-pay | Admitting: Internal Medicine

## 2014-03-21 VITALS — BP 135/88 | HR 89 | Temp 98.7°F | Resp 16 | Ht 60.0 in | Wt 194.0 lb

## 2014-03-21 DIAGNOSIS — K219 Gastro-esophageal reflux disease without esophagitis: Secondary | ICD-10-CM | POA: Insufficient documentation

## 2014-03-21 DIAGNOSIS — M545 Low back pain: Secondary | ICD-10-CM | POA: Insufficient documentation

## 2014-03-21 DIAGNOSIS — E785 Hyperlipidemia, unspecified: Secondary | ICD-10-CM | POA: Insufficient documentation

## 2014-03-21 DIAGNOSIS — G8929 Other chronic pain: Secondary | ICD-10-CM | POA: Insufficient documentation

## 2014-03-21 DIAGNOSIS — Z79899 Other long term (current) drug therapy: Secondary | ICD-10-CM | POA: Insufficient documentation

## 2014-03-21 DIAGNOSIS — Z7982 Long term (current) use of aspirin: Secondary | ICD-10-CM | POA: Insufficient documentation

## 2014-03-21 DIAGNOSIS — Z794 Long term (current) use of insulin: Secondary | ICD-10-CM | POA: Insufficient documentation

## 2014-03-21 DIAGNOSIS — E119 Type 2 diabetes mellitus without complications: Secondary | ICD-10-CM | POA: Insufficient documentation

## 2014-03-21 DIAGNOSIS — F4323 Adjustment disorder with mixed anxiety and depressed mood: Secondary | ICD-10-CM

## 2014-03-21 DIAGNOSIS — I10 Essential (primary) hypertension: Secondary | ICD-10-CM | POA: Insufficient documentation

## 2014-03-21 LAB — GLUCOSE, POCT (MANUAL RESULT ENTRY): POC Glucose: 105 mg/dl — AB (ref 70–99)

## 2014-03-21 MED ORDER — DULOXETINE HCL 30 MG PO CPEP: 30.0000 mg | ORAL_CAPSULE | Freq: Every day | ORAL | Status: DC

## 2014-03-21 MED ORDER — HYDROCHLOROTHIAZIDE 25 MG PO TABS: 25.0000 mg | ORAL_TABLET | Freq: Every day | ORAL | Status: DC

## 2014-03-21 MED ORDER — HYDROXYZINE HCL 10 MG PO TABS: 10.0000 mg | ORAL_TABLET | Freq: Three times a day (TID) | ORAL | Status: DC | PRN

## 2014-03-21 NOTE — Patient Instructions (Signed)
Chronic Back Pain  When back pain lasts longer than 3 months, it is called chronic back pain.People with chronic back pain often go through certain periods that are more intense (flare-ups).  CAUSES Chronic back pain can be caused by wear and tear (degeneration) on different structures in your back. These structures include:  The bones of your spine (vertebrae) and the joints surrounding your spinal cord and nerve roots (facets).  The strong, fibrous tissues that connect your vertebrae (ligaments). Degeneration of these structures may result in pressure on your nerves. This can lead to constant pain. HOME CARE INSTRUCTIONS  Avoid bending, heavy lifting, prolonged sitting, and activities which make the problem worse.  Take brief periods of rest throughout the day to reduce your pain. Lying down or standing usually is better than sitting while you are resting.  Take over-the-counter or prescription medicines only as directed by your caregiver. SEEK IMMEDIATE MEDICAL CARE IF:   You have weakness or numbness in one of your legs or feet.  You have trouble controlling your bladder or bowels.  You have nausea, vomiting, abdominal pain, shortness of breath, or fainting. Document Released: 03/28/2004 Document Revised: 05/13/2011 Document Reviewed: 02/02/2011 [facility] Patient Information 2015 [place], [place]. This information is not intended to replace advice given to you by your health care provider. Make sure you discuss any questions you have with your health care provider. Diabetes and Exercise Exercising regularly is important. It is not just about losing weight. It has many health benefits, such as:  Improving your overall fitness, flexibility, and endurance.  Increasing your bone density.  Helping with weight control.  Decreasing your body fat.  Increasing your muscle strength.  Reducing stress and tension.  Improving your overall health. People with diabetes who exercise gain  additional benefits because exercise:  Reduces appetite.  Improves the body's use of blood sugar (glucose).  Helps lower or control blood glucose.  Decreases blood pressure.  Helps control blood lipids (such as cholesterol and triglycerides).  Improves the body's use of the hormone insulin by:  Increasing the body's insulin sensitivity.  Reducing the body's insulin needs.  Decreases the risk for heart disease because exercising:  Lowers cholesterol and triglycerides levels.  Increases the levels of good cholesterol (such as high-density lipoproteins [HDL]) in the body.  Lowers blood glucose levels. YOUR ACTIVITY PLAN  Choose an activity that you enjoy and set realistic goals. Your health care provider or diabetes educator can help you make an activity plan that works for you. Exercise regularly as directed by your health care provider. This includes:  Performing resistance training twice a week such as push-ups, sit-ups, lifting weights, or using resistance bands.  Performing 150 minutes of cardio exercises each week such as walking, running, or playing sports.  Staying active and spending no more than 90 minutes at one time being inactive. Even short bursts of exercise are good for you. Three 10-minute sessions spread throughout the day are just as beneficial as a single 30-minute session. Some exercise ideas include:  Taking the dog for a walk.  Taking the stairs instead of the elevator.  Dancing to your favorite song.  Doing an exercise video.  Doing your favorite exercise with a friend. RECOMMENDATIONS FOR EXERCISING WITH TYPE 1 OR TYPE 2 DIABETES   Check your blood glucose before exercising. If blood glucose levels are greater than 240 mg/dL, check for urine ketones. Do not exercise if ketones are present.  Avoid injecting insulin into areas of the body that  are going to be exercised. For example, avoid injecting insulin into:  The arms when playing  tennis.  The legs when jogging.  Keep a record of:  Food intake before and after you exercise.  Expected peak times of insulin action.  Blood glucose levels before and after you exercise.  The type and amount of exercise you have done.  Review your records with your health care provider. Your health care provider will help you to develop guidelines for adjusting food intake and insulin amounts before and after exercising.  If you take insulin or oral hypoglycemic agents, watch for signs and symptoms of hypoglycemia. They include:  Dizziness.  Shaking.  Sweating.  Chills.  Confusion.  Drink plenty of water while you exercise to prevent dehydration or heat stroke. Body water is lost during exercise and must be replaced.  Talk to your health care provider before starting an exercise program to make sure it is safe for you. Remember, almost any type of activity is better than none. Document Released: 05/11/2003 Document Revised: 07/05/2013 Document Reviewed: 07/28/2012 [facility] Patient Information 2015 [place], [place]. This information is not intended to replace advice given to you by your health care provider. Make sure you discuss any questions you have with your health care provider. Basic Carbohydrate Counting for Diabetes Mellitus Carbohydrate counting is a method for keeping track of the amount of carbohydrates you eat. Eating carbohydrates naturally increases the level of sugar (glucose) in your blood, so it is important for you to know the amount that is okay for you to have in every meal. Carbohydrate counting helps keep the level of glucose in your blood within normal limits. The amount of carbohydrates allowed is different for every person. A dietitian can help you calculate the amount that is right for you. Once you know the amount of carbohydrates you can have, you can count the carbohydrates in the foods you want to eat. Carbohydrates are found in the following  foods:  Grains, such as breads and cereals.  Dried beans and soy products.  Starchy vegetables, such as potatoes, peas, and corn.  Fruit and fruit juices.  Milk and yogurt.  Sweets and snack foods, such as cake, cookies, candy, chips, soft drinks, and fruit drinks. CARBOHYDRATE COUNTING There are two ways to count the carbohydrates in your food. You can use either of the methods or a combination of both. Reading the "Nutrition Facts" on [place] The "Nutrition Facts" is an area that is included on the labels of almost all packaged food and beverages in the [place]. It includes the serving size of that food or beverage and information about the nutrients in each serving of the food, including the grams (g) of carbohydrate per serving.  Decide the number of servings of this food or beverage that you will be able to eat or drink. Multiply that number of servings by the number of grams of carbohydrate that is listed on the label for that serving. The total will be the amount of carbohydrates you will be having when you eat or drink this food or beverage. Learning Standard Serving Sizes of Food When you eat food that is not packaged or does not include "Nutrition Facts" on the label, you need to measure the servings in order to count the amount of carbohydrates.A serving of most carbohydrate-rich foods contains about 15 g of carbohydrates. The following list includes serving sizes of carbohydrate-rich foods that provide 15 g ofcarbohydrate per serving:   1 slice of  bread (1 oz) or 1 six-inch tortilla.    of a hamburger bun or English muffin.  4-6 crackers.   cup unsweetened dry cereal.    cup hot cereal.   cup rice or pasta.    cup mashed potatoes or  of a large baked potato.  1 cup fresh fruit or one small piece of fruit.    cup canned or frozen fruit or fruit juice.  1 cup milk.   cup plain fat-free yogurt or yogurt sweetened with artificial  sweeteners.   cup cooked dried beans or starchy vegetable, such as peas, corn, or potatoes.  Decide the number of standard-size servings that you will eat. Multiply that number of servings by 15 (the grams of carbohydrates in that serving). For example, if you eat 2 cups of strawberries, you will have eaten 2 servings and 30 g of carbohydrates (2 servings x 15 g = 30 g). For foods such as soups and casseroles, in which more than one food is mixed in, you will need to count the carbohydrates in each food that is included. EXAMPLE OF CARBOHYDRATE COUNTING Sample Dinner  3 oz chicken breast.   cup of brown rice.   cup of corn.  1 cup milk.   1 cup strawberries with sugar-free whipped topping.  Carbohydrate Calculation Step 1: Identify the foods that contain carbohydrates:   Rice.   Corn.   Milk.   Strawberries. Step 2:Calculate the number of servings eaten of each:   2 servings of rice.   1 serving of corn.   1 serving of milk.   1 serving of strawberries. Step 3: Multiply each of those number of servings by 15 g:   2 servings of rice x 15 g = 30 g.   1 serving of corn x 15 g = 15 g.   1 serving of milk x 15 g = 15 g.   1 serving of strawberries x 15 g = 15 g. Step 4: Add together all of the amounts to find the total grams of carbohydrates eaten: 30 g + 15 g + 15 g + 15 g = 75 g. Document Released: 02/18/2005 Document Revised: 07/05/2013 Document Reviewed: 01/15/2013 [facility] Patient Information 2015 [place], [place]. This information is not intended to replace advice given to you by your health care provider. Make sure you discuss any questions you have with your health care provider.

## 2014-03-21 NOTE — Progress Notes (Signed)
Pt is here following up on her diabetes, HTN and hyperlipidemia. Pt recently had back surgery. Pt has been out of her medications for a couple of weeks.

## 2014-03-21 NOTE — Progress Notes (Signed)
Patient ID: Joy Solis, female   DOB: Feb 15, 1966, 49 y.o.   MRN: [id number]   Joy Solis, is a 49 y.o. female  FEX:[id number]  [id number]:[id number]  DOB - 1965-08-26  Chief Complaint  Patient presents with  . Follow-up        Subjective:   Joy Solis is a 49 y.o. female here today for a follow up visit. Patient has history of hypertension, diabetes mellitus, dyslipidemia, osteoarthritis and chronic low back pain. She recently underwent Decompression L3-4 and L4-5 posterior lumbar interbody arthrodesis and Segmental fixation L3-L5. Wound is healing and pain is gradually getting more tolerable but patient complains of insomnia as a result of anxiety and depression from not being able to work and not having money for her medications. Patient has No headache, No chest pain, No abdominal pain - No Nausea, No new weakness tingling or numbness, No Cough - SOB.  Problem  Adjustment Disorder With Mixed Anxiety and Depressed Mood    ALLERGIES: Allergies  Allergen Reactions  . Coreg [Carvedilol]     cough  . Lisinopril     cough    PAST MEDICAL HISTORY: Past Medical History  Diagnosis Date  . Hypertension   . Diabetes mellitus   . Environmental allergies   . Spinal stenosis of lumbar region     pt was injected on 06/29/2013  . Hyperlipidemia   . Dysrhythmia     Holter monitor- 2011  . Tuberculosis     on arrival from [place]- as a teenager, given pills for treatment   . GERD (gastroesophageal reflux disease)     uses baking soda /w water   . Neuromuscular disorder     trigeminal neuralgia , on R side, uses cotton in R ear whether the air gets cold.  . Arthritis     degenerative lumbar spine, knees, hands    MEDICATIONS AT HOME: Prior to Admission medications   Medication Sig Start Date End Date Taking? Authorizing Provider  atorvastatin (LIPITOR) 10 MG tablet Take 1 tablet (10 mg total) by mouth at bedtime. 01/25/14  Yes Tresa Garter, MD  dapagliflozin propanediol  (FARXIGA) 10 MG TABS tablet Take 10 mg by mouth daily.   Yes Historical Provider, MD  gabapentin (NEURONTIN) 400 MG capsule TAKE 1 CAPSULE BY MOUTH 3 TIMES DAILY 03/09/14  Yes Isbella Arline Essie Christine, MD  glipiZIDE (GLUCOTROL) 5 MG tablet Take 1 tablet (5 mg total) by mouth 2 (two) times daily before a meal. 11/16/13  Yes Eliam Snapp E Doreene Burke, MD  glucose monitoring kit (FREESTYLE) monitoring kit 1 each by Does not apply route 4 (four) times daily - after meals and at bedtime. 1 month Diabetic Testing Supplies for QAC-QHS accuchecks. Any manufacturer is acceptable. 04/09/13  Yes Reyne Dumas, MD  metFORMIN (GLUCOPHAGE) 1000 MG tablet Take 1 tablet (1,000 mg total) by mouth 2 (two) times daily with a meal. 11/16/13  Yes Josephina Melcher E Doreene Burke, MD  acetaminophen-codeine (TYLENOL #3) 300-30 MG per tablet Take 1 tablet by mouth every 4 (four) hours as needed for moderate pain. Patient not taking: Reported on 03/21/2014 11/16/13   Tresa Garter, MD  aspirin 81 MG tablet Take 81 mg by mouth daily.    Historical Provider, MD  cyclobenzaprine (FLEXERIL) 10 MG tablet Take 1 tablet (10 mg total) by mouth 3 (three) times daily as needed for muscle spasms. Patient not taking: Reported on 03/21/2014 02/07/14   Ashok Pall, MD  diphenhydrAMINE (SOMINEX) 25 MG tablet Take 25 mg by mouth at  bedtime as needed for sleep.    Historical Provider, MD  DULoxetine (CYMBALTA) 30 MG capsule Take 1 capsule (30 mg total) by mouth daily. 03/21/14   Tresa Garter, MD  guaiFENesin-dextromethorphan (ROBITUSSIN DM) 100-10 MG/5ML syrup Take 5 mLs by mouth every 4 (four) hours as needed for cough. Patient not taking: Reported on 02/03/2014 08/12/13   Tresa Garter, MD  hydrochlorothiazide (HYDRODIURIL) 25 MG tablet Take 1 tablet (25 mg total) by mouth daily. 03/21/14   Tresa Garter, MD  hydrOXYzine (ATARAX/VISTARIL) 10 MG tablet Take 1 tablet (10 mg total) by mouth 3 (three) times daily as needed. 03/21/14   Tresa Garter, MD   Insulin Syringe-Needle U-100 (RELION INSULIN SYR .3CC/29G) 29G X 1/2" 0.3 ML MISC 1 Syringe by Does not apply route as directed. Patient not taking: Reported on 03/21/2014 08/26/12   Clanford Marisa Hua, MD  Menthol-Methyl Salicylate (BEN GAY GREASELESS) 10-15 % greaseless cream Apply 1 application topically 3 (three) times daily as needed for pain (to knee).    Historical Provider, MD  mupirocin ointment (BACTROBAN) 2 % Place 1 application into the nose 2 (two) times daily.    Historical Provider, MD  oxyCODONE-acetaminophen (PERCOCET/ROXICET) 5-325 MG per tablet Take 1-2 tablets by mouth every 6 (six) hours as needed for moderate pain or severe pain. Patient not taking: Reported on 03/21/2014 02/07/14   Ashok Pall, MD     Objective:   Filed Vitals:   03/21/14 1656  BP: 135/88  Pulse: 89  Temp: 98.7 F (37.1 C)  TempSrc: Oral  Resp: 16  Height: 5' (1.524 m)  Weight: 194 lb (87.998 kg)  SpO2: 100%    Exam General appearance : Awake, alert, not in any distress. Speech Clear. Not toxic looking HEENT: Atraumatic and Normocephalic, pupils equally reactive to light and accomodation Neck: supple, no JVD. No cervical lymphadenopathy.  Chest:Good air entry bilaterally, no added sounds  CVS: S1 S2 regular, no murmurs.  Abdomen: Bowel sounds present, Non tender and not distended with no gaurding, rigidity or rebound. Extremities: B/L Lower Ext shows no edema, both legs are warm to touch Neurology: Awake alert, and oriented X 3, CN II-XII intact, Non focal Skin:No Rash Wounds: Midline low back surgical scar, healing well but for a small scab, no evidence of infection, no redness, no discharge.  Data Review Lab Results  Component Value Date   HGBA1C 7.5* 02/03/2014   HGBA1C 6.9 11/16/2013   HGBA1C 8.2 08/12/2013     Assessment & Plan   1. Type 2 diabetes mellitus without complication  - Glucose (CBG)  2. Essential hypertension Refill - hydrochlorothiazide (HYDRODIURIL) 25 MG  tablet; Take 1 tablet (25 mg total) by mouth daily.  Dispense: 90 tablet; Refill: 3  3. Chronic low back pain Status post surgery, patient is doing much better  4. Adjustment disorder with mixed anxiety and depressed mood Prescribed - DULoxetine (CYMBALTA) 30 MG capsule; Take 1 capsule (30 mg total) by mouth daily.  Dispense: 30 capsule; Refill: 3 - hydrOXYzine (ATARAX/VISTARIL) 10 MG tablet; Take 1 tablet (10 mg total) by mouth 3 (three) times daily as needed.  Dispense: 90 tablet; Refill: 3   Patient was counseled extensively about nutrition and exercise as tolerated Patient was extensively counseled on smoking cessation  Return in about 2 months (around 05/20/2014), or if symptoms worsen or fail to improve, for Hemoglobin A1C and Follow up, DM, Follow up Pain and comorbidities.  The patient was given clear instructions to go to  ER or return to medical center if symptoms don't improve, worsen or new problems develop. The patient verbalized understanding. The patient was told to call to get lab results if they haven't heard anything in the next week.   This note has been created with Surveyor, quantity. Any transcriptional errors are unintentional.    Angelica Chessman, MD, [place], [place], [place] and [place], [place]   03/21/2014, 5:56 PM

## 2014-05-16 ENCOUNTER — Telehealth: Payer: Self-pay | Admitting: General Practice

## 2014-05-16 NOTE — Telephone Encounter (Signed)
**  Duplicate message, routed incorrectly**   Patient presents to clinic requesting all of her future prescription refills to be in the form of a script. Patient states she would like to pick the script up and have them delivered to the pharmacy of her choice. Patient is requesting a call back for clarification. Please follow up.

## 2014-05-16 NOTE — Telephone Encounter (Signed)
Patient presents to clinic requesting for all of her future prescription refills to be in the form of a script. Patient states she would like to pick the script up and have them delivered to the pharmacy of her choice. Patient is requesting a call back for clarification. Please follow up.

## 2014-05-23 ENCOUNTER — Other Ambulatory Visit: Payer: Self-pay | Admitting: Emergency Medicine

## 2014-05-23 DIAGNOSIS — I10 Essential (primary) hypertension: Secondary | ICD-10-CM

## 2014-05-23 MED ORDER — GABAPENTIN 400 MG PO CAPS: 400.0000 mg | ORAL_CAPSULE | Freq: Three times a day (TID) | ORAL | Status: DC

## 2014-05-23 MED ORDER — HYDROCHLOROTHIAZIDE 25 MG PO TABS: 25.0000 mg | ORAL_TABLET | Freq: Every day | ORAL | Status: DC

## 2014-05-23 MED ORDER — ATORVASTATIN CALCIUM 10 MG PO TABS: 10.0000 mg | ORAL_TABLET | Freq: Every day | ORAL | Status: DC

## 2014-09-08 ENCOUNTER — Ambulatory Visit: Payer: Self-pay | Attending: Internal Medicine | Admitting: Internal Medicine

## 2014-09-08 ENCOUNTER — Encounter: Payer: Self-pay | Admitting: Internal Medicine

## 2014-09-08 VITALS — BP 121/84 | HR 91 | Temp 98.3°F | Resp 18 | Ht 60.0 in | Wt 204.8 lb

## 2014-09-08 DIAGNOSIS — E119 Type 2 diabetes mellitus without complications: Secondary | ICD-10-CM

## 2014-09-08 DIAGNOSIS — I10 Essential (primary) hypertension: Secondary | ICD-10-CM

## 2014-09-08 DIAGNOSIS — E139 Other specified diabetes mellitus without complications: Secondary | ICD-10-CM

## 2014-09-08 DIAGNOSIS — E785 Hyperlipidemia, unspecified: Secondary | ICD-10-CM

## 2014-09-08 DIAGNOSIS — F4323 Adjustment disorder with mixed anxiety and depressed mood: Secondary | ICD-10-CM

## 2014-09-08 LAB — POCT URINALYSIS DIPSTICK
BILIRUBIN UA: NEGATIVE
GLUCOSE UA: 500
Ketones, UA: NEGATIVE
Leukocytes, UA: NEGATIVE
Nitrite, UA: NEGATIVE
Protein, UA: NEGATIVE
RBC UA: NEGATIVE
SPEC GRAV UA: 1.01
Urobilinogen, UA: 0.2
pH, UA: 6

## 2014-09-08 LAB — LIPID PANEL
CHOL/HDL RATIO: 6.2 ratio
CHOLESTEROL: 228 mg/dL — AB (ref 0–200)
HDL: 37 mg/dL — ABNORMAL LOW (ref >=46)
LDL CALC: 155 mg/dL — AB (ref 0–99)
Triglycerides: 180 mg/dL — ABNORMAL HIGH (ref <150)
VLDL: 36 mg/dL (ref 0–40)

## 2014-09-08 LAB — COMPLETE METABOLIC PANEL WITH GFR
ALBUMIN: 4.3 g/dL (ref 3.5–5.2)
ALK PHOS: 99 U/L (ref 39–117)
ALT: 12 U/L (ref 0–35)
AST: 10 U/L (ref 0–37)
BUN: 15 mg/dL (ref 6–23)
CHLORIDE: 105 meq/L (ref 96–112)
CO2: 23 mEq/L (ref 19–32)
Calcium: 9.7 mg/dL (ref 8.4–10.5)
Creat: 0.75 mg/dL (ref 0.50–1.10)
GFR, Est African American: >89 mL/min
GLUCOSE: 251 mg/dL — AB (ref 70–99)
Potassium: 5.1 mEq/L (ref 3.5–5.3)
Sodium: 140 mEq/L (ref 135–145)
Total Bilirubin: 0.3 mg/dL (ref 0.2–1.2)
Total Protein: 7 g/dL (ref 6.0–8.3)

## 2014-09-08 LAB — POCT GLYCOSYLATED HEMOGLOBIN (HGB A1C): Hemoglobin A1C: 8.1

## 2014-09-08 LAB — GLUCOSE, POCT (MANUAL RESULT ENTRY): POC GLUCOSE: 276 mg/dL — AB (ref 70–99)

## 2014-09-08 MED ORDER — FLUCONAZOLE 150 MG PO TABS: 150.0000 mg | ORAL_TABLET | Freq: Once | ORAL | Status: DC

## 2014-09-08 MED ORDER — ATORVASTATIN CALCIUM 10 MG PO TABS: 10.0000 mg | ORAL_TABLET | Freq: Every day | ORAL | Status: DC

## 2014-09-08 MED ORDER — DULOXETINE HCL 30 MG PO CPEP: 30.0000 mg | ORAL_CAPSULE | Freq: Every day | ORAL | Status: DC

## 2014-09-08 MED ORDER — GLIPIZIDE 10 MG PO TABS: 10.0000 mg | ORAL_TABLET | Freq: Two times a day (BID) | ORAL | Status: DC

## 2014-09-08 MED ORDER — GLIPIZIDE 5 MG PO TABS: 5.0000 mg | ORAL_TABLET | Freq: Two times a day (BID) | ORAL | Status: DC

## 2014-09-08 MED ORDER — HYDROCHLOROTHIAZIDE 25 MG PO TABS: 25.0000 mg | ORAL_TABLET | Freq: Every day | ORAL | Status: DC

## 2014-09-08 MED ORDER — DAPAGLIFLOZIN PROPANEDIOL 10 MG PO TABS: 10.0000 mg | ORAL_TABLET | Freq: Every day | ORAL | Status: DC

## 2014-09-08 MED ORDER — METFORMIN HCL 1000 MG PO TABS: 1000.0000 mg | ORAL_TABLET | Freq: Two times a day (BID) | ORAL | Status: DC

## 2014-09-08 NOTE — Patient Instructions (Signed)
Diabetes and Exercise Exercising regularly is important. It is not just about losing weight. It has many health benefits, such as:  Improving your overall fitness, flexibility, and endurance.  Increasing your bone density.  Helping with weight control.  Decreasing your body fat.  Increasing your muscle strength.  Reducing stress and tension.  Improving your overall health. People with diabetes who exercise gain additional benefits because exercise:  Reduces appetite.  Improves the body's use of blood sugar (glucose).  Helps lower or control blood glucose.  Decreases blood pressure.  Helps control blood lipids (such as cholesterol and triglycerides).  Improves the body's use of the hormone insulin by:  Increasing the body's insulin sensitivity.  Reducing the body's insulin needs.  Decreases the risk for heart disease because exercising:  Lowers cholesterol and triglycerides levels.  Increases the levels of good cholesterol (such as high-density lipoproteins [HDL]) in the body.  Lowers blood glucose levels. YOUR ACTIVITY PLAN  Choose an activity that you enjoy and set realistic goals. Your health care provider or diabetes educator can help you make an activity plan that works for you. Exercise regularly as directed by your health care provider. This includes:  Performing resistance training twice a week such as push-ups, sit-ups, lifting weights, or using resistance bands.  Performing 150 minutes of cardio exercises each week such as walking, running, or playing sports.  Staying active and spending no more than 90 minutes at one time being inactive. Even short bursts of exercise are good for you. Three 10-minute sessions spread throughout the day are just as beneficial as a single 30-minute session. Some exercise ideas include:  Taking the dog for a walk.  Taking the stairs instead of the elevator.  Dancing to your favorite song.  Doing an exercise  video.  Doing your favorite exercise with a friend. RECOMMENDATIONS FOR EXERCISING WITH TYPE 1 OR TYPE 2 DIABETES   Check your blood glucose before exercising. If blood glucose levels are greater than 240 mg/dL, check for urine ketones. Do not exercise if ketones are present.  Avoid injecting insulin into areas of the body that are going to be exercised. For example, avoid injecting insulin into:  The arms when playing tennis.  The legs when jogging.  Keep a record of:  Food intake before and after you exercise.  Expected peak times of insulin action.  Blood glucose levels before and after you exercise.  The type and amount of exercise you have done.  Review your records with your health care provider. Your health care provider will help you to develop guidelines for adjusting food intake and insulin amounts before and after exercising.  If you take insulin or oral hypoglycemic agents, watch for signs and symptoms of hypoglycemia. They include:  Dizziness.  Shaking.  Sweating.  Chills.  Confusion.  Drink plenty of water while you exercise to prevent dehydration or heat stroke. Body water is lost during exercise and must be replaced.  Talk to your health care provider before starting an exercise program to make sure it is safe for you. Remember, almost any type of activity is better than none. Document Released: 05/11/2003 Document Revised: 07/05/2013 Document Reviewed: 07/28/2012 ExitCare Patient Information 2015 [employer]. This information is not intended to replace advice given to you by your health care provider. Make sure you discuss any questions you have with your health care provider. Basic Carbohydrate Counting for Diabetes Mellitus Carbohydrate counting is a method for keeping track of the amount of carbohydrates you eat.   Eating carbohydrates naturally increases the level of sugar (glucose) in your blood, so it is important for you to know the amount that is  okay for you to have in every meal. Carbohydrate counting helps keep the level of glucose in your blood within normal limits. The amount of carbohydrates allowed is different for every person. A dietitian can help you calculate the amount that is right for you. Once you know the amount of carbohydrates you can have, you can count the carbohydrates in the foods you want to eat. Carbohydrates are found in the following foods:  Grains, such as breads and cereals.  Dried beans and soy products.  Starchy vegetables, such as potatoes, peas, and corn.  Fruit and fruit juices.  Milk and yogurt.  Sweets and snack foods, such as cake, cookies, candy, chips, soft drinks, and fruit drinks. CARBOHYDRATE COUNTING There are two ways to count the carbohydrates in your food. You can use either of the methods or a combination of both. Reading the "Nutrition Facts" on Packaged Food The "Nutrition Facts" is an area that is included on the labels of almost all packaged food and beverages in the [place]. It includes the serving size of that food or beverage and information about the nutrients in each serving of the food, including the grams (g) of carbohydrate per serving.  Decide the number of servings of this food or beverage that you will be able to eat or drink. Multiply that number of servings by the number of grams of carbohydrate that is listed on the label for that serving. The total will be the amount of carbohydrates you will be having when you eat or drink this food or beverage. Learning Standard Serving Sizes of Food When you eat food that is not packaged or does not include "Nutrition Facts" on the label, you need to measure the servings in order to count the amount of carbohydrates.A serving of most carbohydrate-rich foods contains about 15 g of carbohydrates. The following list includes serving sizes of carbohydrate-rich foods that provide 15 g ofcarbohydrate per serving:   1 slice of bread  (1 oz) or 1 six-inch tortilla.    of a hamburger bun or English muffin.  4-6 crackers.   cup unsweetened dry cereal.    cup hot cereal.   cup rice or pasta.    cup mashed potatoes or  of a large baked potato.  1 cup fresh fruit or one small piece of fruit.    cup canned or frozen fruit or fruit juice.  1 cup milk.   cup plain fat-free yogurt or yogurt sweetened with artificial sweeteners.   cup cooked dried beans or starchy vegetable, such as peas, corn, or potatoes.  Decide the number of standard-size servings that you will eat. Multiply that number of servings by 15 (the grams of carbohydrates in that serving). For example, if you eat 2 cups of strawberries, you will have eaten 2 servings and 30 g of carbohydrates (2 servings x 15 g = 30 g). For foods such as soups and casseroles, in which more than one food is mixed in, you will need to count the carbohydrates in each food that is included. EXAMPLE OF CARBOHYDRATE COUNTING Sample Dinner  3 oz chicken breast.   cup of brown rice.   cup of corn.  1 cup milk.   1 cup strawberries with sugar-free whipped topping.  Carbohydrate Calculation Step 1: Identify the foods that contain carbohydrates:   Rice.   Corn.     Milk.   Strawberries. Step 2:Calculate the number of servings eaten of each:   2 servings of rice.   1 serving of corn.   1 serving of milk.   1 serving of strawberries. Step 3: Multiply each of those number of servings by 15 g:   2 servings of rice x 15 g = 30 g.   1 serving of corn x 15 g = 15 g.   1 serving of milk x 15 g = 15 g.   1 serving of strawberries x 15 g = 15 g. Step 4: Add together all of the amounts to find the total grams of carbohydrates eaten: 30 g + 15 g + 15 g + 15 g = 75 g. Document Released: 02/18/2005 Document Revised: 07/05/2013 Document Reviewed: 01/15/2013 ExitCare Patient Information 2015 [employer]. This information is not intended to  replace advice given to you by your health care provider. Make sure you discuss any questions you have with your health care provider.  

## 2014-09-08 NOTE — Progress Notes (Signed)
Patient is here for a check up today. Patient reports pain located in lower back. Patient had a surgery on her back in December. Pain described as aching and sore.   Patient reports she has been catching bad cramps in her right foot and in both thighs.   Patient needs paper prescriptions today. Patient needs refills on metformin, glipizide, gabapentin, duloxetine, atorvastatin.  Patient reports she did not have a period last month and she has been experiencing hot flashes and been experiencing insomnia. Patient states she thinks she is going through menopause.  Patient reports that her left eye is fuzzy and jumpy for some reason.

## 2014-09-08 NOTE — Progress Notes (Signed)
Patient ID: Joy Solis, female   DOB: [birth year], 49 y.o.   MRN: [id number]   Joy Solis, is a 49 y.o. female  [id number]  IHK:[id number]  DOB - Apr 07, 1965  Chief Complaint  Patient presents with  . Annual Exam        Subjective:   Joy Solis is a 49 y.o. female with history of type 2 diabetes mellitus without complications, hypertension, dyslipidemia, spinal stenosis status post surgery, chronic osteoarthritis of both knees, GERD and major depression here today for a follow up visit. Patient is complaining of pain in her lower back since her surgery in December, described as aching and sore. Patient also has cramps in her right foot and both thighs. She requests prescription refill on all her medications. Her last hemoglobin A1c was 7.5%, today it has gone up to 8.1% despite compliance with medications. Patient thinks she is going through menopause, did not have her menstrual period last month and has been having hot flashes and occasional insomnia. Patient continues to have mixed anxiety and depression because of her chronic medical conditions and her back pain. She denies any suicidal ideation or thoughts. Patient has No headache, No chest pain, No abdominal pain - No Nausea, No new weakness tingling or numbness, No Cough - SOB.  Problem  Type 2 Diabetes Mellitus Without Complications    ALLERGIES: Allergies  Allergen Reactions  . Coreg [Carvedilol]     cough  . Lisinopril     cough    PAST MEDICAL HISTORY: Past Medical History  Diagnosis Date  . Hypertension   . Diabetes mellitus   . Environmental allergies   . Spinal stenosis of lumbar region     pt was injected on 06/29/2013  . Hyperlipidemia   . Dysrhythmia     Holter monitor- 2011  . Tuberculosis     on arrival from [place]- as a teenager, given pills for treatment   . GERD (gastroesophageal reflux disease)     uses baking soda /w water   . Neuromuscular disorder     trigeminal neuralgia , on R side,  uses cotton in R ear whether the air gets cold.  . Arthritis     degenerative lumbar spine, knees, hands    MEDICATIONS AT HOME: Prior to Admission medications   Medication Sig Start Date End Date Taking? Authorizing Provider  aspirin 81 MG tablet Take 81 mg by mouth daily.   Yes Historical Provider, MD  atorvastatin (LIPITOR) 10 MG tablet Take 1 tablet (10 mg total) by mouth at bedtime. 09/08/14  Yes Tresa Garter, MD  dapagliflozin propanediol (FARXIGA) 10 MG TABS tablet Take 10 mg by mouth daily. 09/08/14  Yes Tresa Garter, MD  diphenhydrAMINE (SOMINEX) 25 MG tablet Take 25 mg by mouth at bedtime as needed for sleep.   Yes Historical Provider, MD  DULoxetine (CYMBALTA) 30 MG capsule Take 1 capsule (30 mg total) by mouth daily. 09/08/14  Yes Tresa Garter, MD  gabapentin (NEURONTIN) 400 MG capsule Take 1 capsule (400 mg total) by mouth 3 (three) times daily. 05/23/14  Yes Olugbemiga Essie Christine, MD  glipiZIDE (GLUCOTROL) 10 MG tablet Take 1 tablet (10 mg total) by mouth 2 (two) times daily before a meal. 09/08/14  Yes Olugbemiga E Doreene Burke, MD  glucose monitoring kit (FREESTYLE) monitoring kit 1 each by Does not apply route 4 (four) times daily - after meals and at bedtime. 1 month Diabetic Testing Supplies for QAC-QHS accuchecks. Any manufacturer is acceptable. 04/09/13  Yes Reyne Dumas, MD  hydrochlorothiazide (HYDRODIURIL) 25 MG tablet Take 1 tablet (25 mg total) by mouth daily. 09/08/14  Yes Tresa Garter, MD  hydrOXYzine (ATARAX/VISTARIL) 10 MG tablet Take 1 tablet (10 mg total) by mouth 3 (three) times daily as needed. 03/21/14  Yes Tresa Garter, MD  Menthol-Methyl Salicylate (BEN GAY GREASELESS) 10-15 % greaseless cream Apply 1 application topically 3 (three) times daily as needed for pain (to knee).   Yes Historical Provider, MD  metFORMIN (GLUCOPHAGE) 1000 MG tablet Take 1 tablet (1,000 mg total) by mouth 2 (two) times daily with a meal. 09/08/14  Yes Olugbemiga E Doreene Burke, MD   acetaminophen-codeine (TYLENOL #3) 300-30 MG per tablet Take 1 tablet by mouth every 4 (four) hours as needed for moderate pain. Patient not taking: Reported on 03/21/2014 11/16/13   Tresa Garter, MD  cyclobenzaprine (FLEXERIL) 10 MG tablet Take 1 tablet (10 mg total) by mouth 3 (three) times daily as needed for muscle spasms. Patient not taking: Reported on 03/21/2014 02/07/14   Ashok Pall, MD  fluconazole (DIFLUCAN) 150 MG tablet Take 1 tablet (150 mg total) by mouth once. Repeat in one week 09/08/14   Tresa Garter, MD  guaiFENesin-dextromethorphan (ROBITUSSIN DM) 100-10 MG/5ML syrup Take 5 mLs by mouth every 4 (four) hours as needed for cough. Patient not taking: Reported on 02/03/2014 08/12/13   Tresa Garter, MD  Insulin Syringe-Needle U-100 (RELION INSULIN SYR .3CC/29G) 29G X 1/2" 0.3 ML MISC 1 Syringe by Does not apply route as directed. Patient not taking: Reported on 03/21/2014 08/26/12   Clanford Marisa Hua, MD     Objective:   Filed Vitals:   09/08/14 1717 09/08/14 1723  BP:  121/84  Pulse:  91  Temp:  98.3 F (36.8 C)  TempSrc:  Oral  Resp:  18  Height: 5' (1.524 m)   Weight: 204 lb 12.8 oz (92.897 kg)   SpO2:  97%    Exam General appearance : Awake, alert, not in any distress. Speech Clear. Not toxic looking, obese HEENT: Atraumatic and Normocephalic, pupils equally reactive to light and accomodation Neck: supple, no JVD. No cervical lymphadenopathy.  Chest:Good air entry bilaterally, no added sounds  CVS: S1 S2 regular, no murmurs.  Abdomen: Bowel sounds present, Non tender and not distended with no gaurding, rigidity or rebound. Extremities: B/L Lower Ext shows no edema, both legs are warm to touch Neurology: Awake alert, and oriented X 3, CN II-XII intact, Non focal Skin:No Rash  Data Review Lab Results  Component Value Date   HGBA1C 8.10 09/08/2014   HGBA1C 7.5* 02/03/2014   HGBA1C 6.9 11/16/2013     Assessment & Plan   1. Adjustment  disorder with mixed anxiety and depressed mood  - DULoxetine (CYMBALTA) 30 MG capsule; Take 1 capsule (30 mg total) by mouth daily.  Dispense: 30 capsule; Refill: 3  2. Type 2 diabetes mellitus without complications  - Glucose (CBG) - HgB A1c - dapagliflozin propanediol (FARXIGA) 10 MG TABS tablet; Take 10 mg by mouth daily.  Dispense: 30 tablet; Refill: 3 - metFORMIN (GLUCOPHAGE) 1000 MG tablet; Take 1 tablet (1,000 mg total) by mouth 2 (two) times daily with a meal.  Dispense: 180 tablet; Refill: 3 - COMPLETE METABOLIC PANEL WITH GFR - Microalbumin/Creatinine Ratio, Urine - Microalbumin, urine - Urinalysis Dipstick Increase - glipiZIDE (GLUCOTROL) 10 MG tablet; Take 1 tablet (10 mg total) by mouth 2 (two) times daily before a meal.  Dispense: 90 tablet; Refill: 3 -  fluconazole (DIFLUCAN) 150 MG tablet; Take 1 tablet (150 mg total) by mouth once. Repeat in one week  Dispense: 1 tablet; Refill: 1   Aim for 30 minutes of exercise most days. Rethink what you drink. Water is great! Aim for 2-3 Carb Choices per meal (30-45 grams) +/- 1 either way  Aim for 0-15 Carbs per snack if hungry  Include protein in moderation with your meals and snacks  Consider reading food labels for Total Carbohydrate and Fat Grams of foods  Consider checking BG at alternate times per day  Continue taking medication as directed Be mindful about how much sugar you are adding to beverages and other foods. Try to decrease. Consider splenda. Fruit Punch - find one with no sugar  Measure and decrease portions of carbohydrate foods  Make your plate and don't go back for seconds   3. Essential hypertension  - hydrochlorothiazide (HYDRODIURIL) 25 MG tablet; Take 1 tablet (25 mg total) by mouth daily.  Dispense: 90 tablet; Refill: 3  - We have discussed target BP range and blood pressure goal - I have advised patient to check BP regularly and to call us back or report to clinic if the numbers are consistently  higher than 140/90  - We discussed the importance of compliance with medical therapy and DASH diet recommended, consequences of uncontrolled hypertension discussed.  - continue current BP medications  4. Dyslipidemia  - atorvastatin (LIPITOR) 10 MG tablet; Take 1 tablet (10 mg total) by mouth at bedtime.  Dispense: 90 tablet; Refill: 3 - Lipid panel  To address this please limit saturated fat to no more than 7% of your calories, limit cholesterol to 200 mg/day, increase fiber and exercise as tolerated. If needed we may add another cholesterol lowering medication to your regimen.    Patient have been counseled extensively about nutrition and exercise Return in about 3 months (around 12/09/2014) for Hemoglobin A1C and Follow up, DM, Follow up HTN, Follow up Pain and comorbidities.  The patient was given clear instructions to go to ER or return to medical center if symptoms don't improve, worsen or new problems develop. The patient verbalized understanding. The patient was told to call to get lab results if they haven't heard anything in the next week.   This note has been created with Surveyor, quantity. Any transcriptional errors are unintentional.    Angelica Chessman, MD, MHA, [place], [place], [place] and [place], [place]   09/08/2014, 5:58 PM

## 2014-09-09 ENCOUNTER — Telehealth: Payer: Self-pay | Admitting: *Deleted

## 2014-09-09 LAB — MICROALBUMIN / CREATININE URINE RATIO
Creatinine, Urine: 33.7 mg/dL
MICROALB/CREAT RATIO: 5.9 mg/g (ref 0.0–30.0)
Microalb, Ur: 0.2 mg/dL (ref <2.0)

## 2014-09-09 NOTE — Telephone Encounter (Signed)
-----   Message from Tresa Garter, MD sent at 09/09/2014  9:16 AM EDT ----- Please inform patient that her laboratory test results are mostly within normal limit except for her cholesterol level that is high, encourage patient to continue her cholesterol medication and alsoto address this please limit saturated fat to no more than 7% of your calories, limit cholesterol to 200 mg/day, increase fiber and exercise as tolerated. If needed we may increase the dose of your cholesterol lowering medication.

## 2014-09-09 NOTE — Telephone Encounter (Signed)
Attempted to contact patient and voice mail said that patient was not available and it would not allow me to leave a message.

## 2014-10-24 ENCOUNTER — Emergency Department (HOSPITAL_COMMUNITY): Payer: Self-pay

## 2014-10-24 ENCOUNTER — Emergency Department (HOSPITAL_COMMUNITY)
Admission: EM | Admit: 2014-10-24 | Discharge: 2014-10-24 | Disposition: A | Discharge status: Home / Self Care | Payer: Self-pay | Attending: Emergency Medicine | Admitting: Emergency Medicine

## 2014-10-24 ENCOUNTER — Encounter (HOSPITAL_COMMUNITY): Payer: Self-pay | Admitting: Vascular Surgery

## 2014-10-24 DIAGNOSIS — Z7982 Long term (current) use of aspirin: Secondary | ICD-10-CM | POA: Insufficient documentation

## 2014-10-24 DIAGNOSIS — Z8611 Personal history of tuberculosis: Secondary | ICD-10-CM | POA: Insufficient documentation

## 2014-10-24 DIAGNOSIS — I10 Essential (primary) hypertension: Secondary | ICD-10-CM | POA: Insufficient documentation

## 2014-10-24 DIAGNOSIS — Z794 Long term (current) use of insulin: Secondary | ICD-10-CM | POA: Insufficient documentation

## 2014-10-24 DIAGNOSIS — Z79899 Other long term (current) drug therapy: Secondary | ICD-10-CM | POA: Insufficient documentation

## 2014-10-24 DIAGNOSIS — M25561 Pain in right knee: Secondary | ICD-10-CM | POA: Insufficient documentation

## 2014-10-24 DIAGNOSIS — K219 Gastro-esophageal reflux disease without esophagitis: Secondary | ICD-10-CM | POA: Insufficient documentation

## 2014-10-24 DIAGNOSIS — E785 Hyperlipidemia, unspecified: Secondary | ICD-10-CM | POA: Insufficient documentation

## 2014-10-24 DIAGNOSIS — E119 Type 2 diabetes mellitus without complications: Secondary | ICD-10-CM | POA: Insufficient documentation

## 2014-10-24 DIAGNOSIS — Z720 Tobacco use: Secondary | ICD-10-CM | POA: Insufficient documentation

## 2014-10-24 MED ORDER — TRAMADOL HCL 50 MG PO TABS
50.0000 mg | ORAL_TABLET | Freq: Once | ORAL | Status: AC
  Administered 2014-10-24: 50 mg via ORAL
  Filled 2014-10-24: qty 1

## 2014-10-24 NOTE — ED Provider Notes (Signed)
CSN: [id number]     Arrival date & time 10/24/14  2115 History  This chart was scribed for [name], PA-C, working with [name], MD by Starleen Arms, ED Scribe. This patient was seen in room TR10C/TR10C and the patient's care was started at 10:53 PM.   Chief Complaint  Patient presents with  . Knee Pain   The history is provided by the patient. No language interpreter was used.    HPI Comments: Joy Solis is a 49 y.o. female with hx of arthritis who presents to the Emergency Department complaining of an exacerbation of her chronic, moderate, waxing and waning right knee pain.  She reports feeling her knee intermittently pop and displace while walking (she walks with a can normal to baseline).  There has been no relief with ice and ibuprofen but significant improvement with left over percocet.  She has been seen by orthopedist for this but cannot afford to continue receiving injections.   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     Past Medical History  Diagnosis Date  . Hypertension   . Diabetes mellitus   . Environmental allergies   . Spinal stenosis of lumbar region     pt was injected on 06/29/2013  . Hyperlipidemia   . Dysrhythmia     Holter monitor- 2011  . Tuberculosis     on arrival from [place]- as a teenager, given pills for treatment   . GERD (gastroesophageal reflux disease)     uses baking soda /w water   . Neuromuscular disorder     trigeminal neuralgia , on R side, uses cotton in R ear whether the air gets cold.  . Arthritis     degenerative lumbar spine, knees, hands   Past Surgical History  Procedure Laterality Date  . Knee surgery Left 2010    arthroscopy  . Hand surgery Right     carpal tunnel release   . Ankle surgery Right     pin in place- for heel pain   . Foot surgery Right 2008    arch, stretched    Family History  Problem Relation Age of Onset  . Heart disease Mother   . Diabetes Mother   .  Glaucoma Maternal Grandmother    Social History  Substance Use Topics  . Smoking status: Current Every Day Smoker -- 1.00 packs/day  . Smokeless tobacco: Never Used  . Alcohol Use: No   OB History    No data available     Review of Systems  All other systems reviewed and are negative.     Allergies  Coreg and Lisinopril  Home Medications   Prior to Admission medications   Medication Sig Start Date End Date Taking? Authorizing Provider  acetaminophen-codeine (TYLENOL #3) 300-30 MG per tablet Take 1 tablet by mouth every 4 (four) hours as needed for moderate pain. Patient not taking: Reported on 03/21/2014 11/16/13   Tresa Garter, MD  aspirin 81 MG tablet Take 81 mg by mouth daily.    Historical Provider, MD  atorvastatin (LIPITOR) 10 MG tablet Take 1 tablet (10 mg total) by mouth at bedtime. 09/08/14   Tresa Garter, MD  cyclobenzaprine (FLEXERIL) 10 MG tablet Take 1 tablet (10 mg total) by mouth 3 (three) times daily as needed for muscle spasms. Patient not taking: Reported on 03/21/2014 02/07/14   Ashok Pall, MD  dapagliflozin propanediol (FARXIGA) 10 MG TABS tablet Take 10 mg  by mouth daily. 09/08/14   Tresa Garter, MD  diphenhydrAMINE (SOMINEX) 25 MG tablet Take 25 mg by mouth at bedtime as needed for sleep.    Historical Provider, MD  DULoxetine (CYMBALTA) 30 MG capsule Take 1 capsule (30 mg total) by mouth daily. 09/08/14   Tresa Garter, MD  fluconazole (DIFLUCAN) 150 MG tablet Take 1 tablet (150 mg total) by mouth once. Repeat in one week 09/08/14   Tresa Garter, MD  gabapentin (NEURONTIN) 400 MG capsule Take 1 capsule (400 mg total) by mouth 3 (three) times daily. 05/23/14   Tresa Garter, MD  glipiZIDE (GLUCOTROL) 10 MG tablet Take 1 tablet (10 mg total) by mouth 2 (two) times daily before a meal. 09/08/14   Tresa Garter, MD  glucose monitoring kit (FREESTYLE) monitoring kit 1 each by Does not apply route 4 (four) times daily - after  meals and at bedtime. 1 month Diabetic Testing Supplies for QAC-QHS accuchecks. Any manufacturer is acceptable. 04/09/13   Reyne Dumas, MD  guaiFENesin-dextromethorphan (ROBITUSSIN DM) 100-10 MG/5ML syrup Take 5 mLs by mouth every 4 (four) hours as needed for cough. Patient not taking: Reported on 02/03/2014 08/12/13   Tresa Garter, MD  hydrochlorothiazide (HYDRODIURIL) 25 MG tablet Take 1 tablet (25 mg total) by mouth daily. 09/08/14   Tresa Garter, MD  hydrOXYzine (ATARAX/VISTARIL) 10 MG tablet Take 1 tablet (10 mg total) by mouth 3 (three) times daily as needed. 03/21/14   Tresa Garter, MD  Insulin Syringe-Needle U-100 (RELION INSULIN SYR .3CC/29G) 29G X 1/2" 0.3 ML MISC 1 Syringe by Does not apply route as directed. Patient not taking: Reported on 03/21/2014 08/26/12   Clanford Marisa Hua, MD  Menthol-Methyl Salicylate (BEN GAY GREASELESS) 10-15 % greaseless cream Apply 1 application topically 3 (three) times daily as needed for pain (to knee).    Historical Provider, MD  metFORMIN (GLUCOPHAGE) 1000 MG tablet Take 1 tablet (1,000 mg total) by mouth 2 (two) times daily with a meal. 09/08/14   Olugbemiga E Jegede, MD   BP 127/86 mmHg  Pulse 77  Temp(Src) 98.2 F (36.8 C) (Oral)  Resp 20  SpO2 96%  LMP 10/17/2014 (Approximate)    Physical Exam  Constitutional: She is oriented to person, place, and time. She appears well-developed and well-nourished. No distress.  HENT:  Head: Normocephalic and atraumatic.  Eyes: Conjunctivae and EOM are normal.  Neck: Neck supple. No tracheal deviation present.  Cardiovascular: Normal rate.   Pulmonary/Chest: Effort normal. No respiratory distress.  Musculoskeletal: Normal range of motion.  TTP of joint line bilateral. No signs of infection, full active ROM.   Neurological: She is alert and oriented to person, place, and time.  Skin: Skin is warm and dry.  Psychiatric: She has a normal mood and affect. Her behavior is normal.  Nursing note  and vitals reviewed.   ED Course  Procedures (including critical care time)  DIAGNOSTIC STUDIES: Oxygen Saturation is 96% on RA, normal by my interpretation.    COORDINATION OF CARE:  11:01 PM Discussed treatment plan with patient at bedside.  Patient acknowledges and agrees with plan.    Labs Review Labs Reviewed - No data to display  Imaging Review Dg Knee Complete 4 Views Right  10/24/2014   CLINICAL DATA:  Right knee pain for 1 week. No known injury. Difficulty walking.  EXAM: RIGHT KNEE - COMPLETE 4+ VIEW  COMPARISON:  06/03/2010  FINDINGS: No fracture or dislocation. There is tricompartmental osteoarthritis  that has progressed from prior exam. Mild joint space narrowing of the medial tibial femoral compartment with peripheral osteophytes. Peripheral osteophytes involving the patellofemoral and lateral tibial femoral compartment. Osseous densities posterior to the femoral condyles may reflect fragments osteophyte versus intra-articular bodies. There is a moderate joint effusion.  IMPRESSION: Moderate tricompartmental osteoarthritis without acute bony abnormality. Moderate joint effusion.   Electronically Signed   By: Jeb Levering M.D.   On: 10/24/2014 22:46   I have personally reviewed and evaluated these images and lab results as part of my medical decision-making.   EKG Interpretation None      MDM   Final diagnoses:  Right knee pain   Labs:  Imaging: DG Right knee Complete 4 Views- osteoarthritis with joint effusion Therapeutics: Tramadol  Assessment/Plan: no signs of infection on exam, chronic in nature. Pt does have findings on exam and joint effusion that would indicate actual knee pain. Pt has not been seen in the ED for the same. She will be given short course of pain medication until she can follow-up with PCP or ortho for further evaluation. Highly unlikely to be septic joint or gout. Strict return precautions given. Pt understands that we will not continue to  provide pain mediation after this initial visit.   Discharge Meds: Tramadol   I personally performed the services described in this documentation, which was scribed in my presence. The recorded information has been reviewed and is accurate.    [name], PA-C 10/26/14 [place], MD 10/28/14 1807

## 2014-10-24 NOTE — ED Notes (Signed)
Pt reports to the ED for eval of right knee pain x several days. She reports she has a hx of arthritis. She reports she has tried creams, OTC medications, and icing and reports no relief in her symptoms. Denies any new injury to the knee. She also reports it occasionally shifts out of place when she walks. Pt A&Ox4, resp e/u, and skin warm and dry.

## 2014-10-24 NOTE — Discharge Instructions (Signed)

## 2014-11-11 ENCOUNTER — Telehealth: Payer: Self-pay

## 2014-11-11 ENCOUNTER — Other Ambulatory Visit: Payer: Self-pay | Admitting: Internal Medicine

## 2014-11-15 ENCOUNTER — Other Ambulatory Visit: Payer: Self-pay | Admitting: *Deleted

## 2014-11-15 DIAGNOSIS — E119 Type 2 diabetes mellitus without complications: Secondary | ICD-10-CM

## 2014-11-15 MED ORDER — METFORMIN HCL 1000 MG PO TABS: 1000.0000 mg | ORAL_TABLET | Freq: Two times a day (BID) | ORAL | Status: DC

## 2014-11-16 ENCOUNTER — Other Ambulatory Visit: Payer: Self-pay | Admitting: *Deleted

## 2014-11-16 DIAGNOSIS — E119 Type 2 diabetes mellitus without complications: Secondary | ICD-10-CM

## 2014-11-16 MED ORDER — METFORMIN HCL 1000 MG PO TABS: 1000.0000 mg | ORAL_TABLET | Freq: Two times a day (BID) | ORAL | Status: DC

## 2015-01-19 ENCOUNTER — Ambulatory Visit: Payer: Self-pay | Admitting: Internal Medicine

## 2015-02-16 ENCOUNTER — Encounter: Payer: Self-pay | Admitting: Internal Medicine

## 2015-02-16 ENCOUNTER — Ambulatory Visit: Payer: Self-pay | Attending: Internal Medicine | Admitting: Internal Medicine

## 2015-02-16 VITALS — BP 136/84 | HR 99 | Temp 98.0°F | Resp 18 | Ht 60.0 in | Wt 204.4 lb

## 2015-02-16 DIAGNOSIS — Z1231 Encounter for screening mammogram for malignant neoplasm of breast: Secondary | ICD-10-CM | POA: Insufficient documentation

## 2015-02-16 DIAGNOSIS — E785 Hyperlipidemia, unspecified: Secondary | ICD-10-CM | POA: Insufficient documentation

## 2015-02-16 DIAGNOSIS — Z794 Long term (current) use of insulin: Secondary | ICD-10-CM | POA: Insufficient documentation

## 2015-02-16 DIAGNOSIS — Z888 Allergy status to other drugs, medicaments and biological substances status: Secondary | ICD-10-CM | POA: Insufficient documentation

## 2015-02-16 DIAGNOSIS — G4700 Insomnia, unspecified: Secondary | ICD-10-CM | POA: Insufficient documentation

## 2015-02-16 DIAGNOSIS — K219 Gastro-esophageal reflux disease without esophagitis: Secondary | ICD-10-CM | POA: Insufficient documentation

## 2015-02-16 DIAGNOSIS — D171 Benign lipomatous neoplasm of skin and subcutaneous tissue of trunk: Secondary | ICD-10-CM | POA: Insufficient documentation

## 2015-02-16 DIAGNOSIS — Z7982 Long term (current) use of aspirin: Secondary | ICD-10-CM | POA: Insufficient documentation

## 2015-02-16 DIAGNOSIS — Z1211 Encounter for screening for malignant neoplasm of colon: Secondary | ICD-10-CM | POA: Insufficient documentation

## 2015-02-16 DIAGNOSIS — E119 Type 2 diabetes mellitus without complications: Secondary | ICD-10-CM | POA: Insufficient documentation

## 2015-02-16 DIAGNOSIS — G8929 Other chronic pain: Secondary | ICD-10-CM | POA: Insufficient documentation

## 2015-02-16 DIAGNOSIS — F17200 Nicotine dependence, unspecified, uncomplicated: Secondary | ICD-10-CM | POA: Insufficient documentation

## 2015-02-16 DIAGNOSIS — F329 Major depressive disorder, single episode, unspecified: Secondary | ICD-10-CM | POA: Insufficient documentation

## 2015-02-16 DIAGNOSIS — M545 Low back pain: Secondary | ICD-10-CM | POA: Insufficient documentation

## 2015-02-16 DIAGNOSIS — Z79899 Other long term (current) drug therapy: Secondary | ICD-10-CM | POA: Insufficient documentation

## 2015-02-16 DIAGNOSIS — Z7984 Long term (current) use of oral hypoglycemic drugs: Secondary | ICD-10-CM | POA: Insufficient documentation

## 2015-02-16 DIAGNOSIS — I10 Essential (primary) hypertension: Secondary | ICD-10-CM | POA: Insufficient documentation

## 2015-02-16 LAB — POCT GLYCOSYLATED HEMOGLOBIN (HGB A1C): Hemoglobin A1C: 8.4

## 2015-02-16 LAB — GLUCOSE, POCT (MANUAL RESULT ENTRY): POC Glucose: 190 mg/dl — AB (ref 70–99)

## 2015-02-16 MED ORDER — ATORVASTATIN CALCIUM 10 MG PO TABS: 10.0000 mg | ORAL_TABLET | Freq: Every day | ORAL | Status: DC

## 2015-02-16 MED ORDER — DAPAGLIFLOZIN PROPANEDIOL 10 MG PO TABS: 10.0000 mg | ORAL_TABLET | Freq: Every day | ORAL | Status: DC

## 2015-02-16 MED ORDER — AMITRIPTYLINE HCL 75 MG PO TABS: 75.0000 mg | ORAL_TABLET | Freq: Every evening | ORAL | Status: DC | PRN

## 2015-02-16 MED ORDER — HYDROCHLOROTHIAZIDE 25 MG PO TABS: 25.0000 mg | ORAL_TABLET | Freq: Every day | ORAL | Status: DC

## 2015-02-16 MED ORDER — GLIPIZIDE 10 MG PO TABS: 10.0000 mg | ORAL_TABLET | Freq: Two times a day (BID) | ORAL | Status: DC

## 2015-02-16 MED ORDER — GABAPENTIN 400 MG PO CAPS: 400.0000 mg | ORAL_CAPSULE | Freq: Three times a day (TID) | ORAL | Status: DC

## 2015-02-16 MED ORDER — METFORMIN HCL 1000 MG PO TABS: 1000.0000 mg | ORAL_TABLET | Freq: Two times a day (BID) | ORAL | Status: DC

## 2015-02-16 NOTE — Progress Notes (Signed)
Patient ID: Joy Solis, female   DOB: [birth year], 49 y.o.   MRN: [id number]   Joy Solis, is a 49 y.o. female  ZCH:[id number]  OIN:[id number]  DOB - 1966-01-23  Chief Complaint  Patient presents with  . Follow-up        Subjective:   Joy Solis is a 49 y.o. female with history of type 2 diabetes mellitus without complications, hypertension, dyslipidemia, spinal stenosis status post surgery, chronic osteoarthritis of both knees, GERD and major depression here today for a follow up visit. Patient is complaining of insomnia because of severe pain in her knees and back. She rates her pain at about 8. She has not been compliant with medications and diet because of her schedule at work. She claims she works from 10 AM to 8 PM, most of the time she skips breakfast and she drinks a lot of soda in between. She continues to smoke heavily about 1 pack of cigarettes per day, she says she is not ready to quit. Her husband was recently diagnosed with prostate cancer stage IV, this has put a lot of stress on the family. She is the only source of income and there are so many bills to pay including medical. Her blood pressure and blood sugar are not been favorably controlled. She is due for mammogram and colonoscopy. She has no fever. She denies any abdominal pain or swelling. No history of fall. Patient has No headache, No chest pain, No Nausea, No new weakness tingling or numbness, No Cough - SOB.  Problem  Dyslipidemia  Encounter for Screening Mammogram for Breast Cancer  Colon Cancer Screening  Lipoma of Back    ALLERGIES: Allergies  Allergen Reactions  . Coreg [Carvedilol]     cough  . Lisinopril     cough    PAST MEDICAL HISTORY: Past Medical History  Diagnosis Date  . Hypertension   . Diabetes mellitus   . Environmental allergies   . Spinal stenosis of lumbar region     pt was injected on 06/29/2013  . Hyperlipidemia   . Dysrhythmia     Holter monitor- 2011  . Tuberculosis      on arrival from [place]- as a teenager, given pills for treatment   . GERD (gastroesophageal reflux disease)     uses baking soda /w water   . Neuromuscular disorder (HCC)     trigeminal neuralgia , on R side, uses cotton in R ear whether the air gets cold.  . Arthritis     degenerative lumbar spine, knees, hands    MEDICATIONS AT HOME: Prior to Admission medications   Medication Sig Start Date End Date Taking? Authorizing Provider  aspirin 81 MG tablet Take 81 mg by mouth daily.   Yes Historical Provider, MD  atorvastatin (LIPITOR) 10 MG tablet Take 1 tablet (10 mg total) by mouth at bedtime. 02/16/15  Yes Tresa Garter, MD  cyclobenzaprine (FLEXERIL) 10 MG tablet Take 1 tablet (10 mg total) by mouth 3 (three) times daily as needed for muscle spasms. 02/07/14  Yes Ashok Pall, MD  dapagliflozin propanediol (FARXIGA) 10 MG TABS tablet Take 10 mg by mouth daily. 02/16/15  Yes Tresa Garter, MD  diphenhydrAMINE (SOMINEX) 25 MG tablet Take 25 mg by mouth at bedtime as needed for sleep.   Yes Historical Provider, MD  DULoxetine (CYMBALTA) 30 MG capsule Take 1 capsule (30 mg total) by mouth daily. 09/08/14  Yes Tresa Garter, MD  gabapentin (NEURONTIN) 400 MG capsule Take  1 capsule (400 mg total) by mouth 3 (three) times daily. 02/16/15  Yes Jame Morrell Essie Christine, MD  glipiZIDE (GLUCOTROL) 10 MG tablet Take 1 tablet (10 mg total) by mouth 2 (two) times daily before a meal. 02/16/15  Yes Daley Gosse E Doreene Burke, MD  glucose monitoring kit (FREESTYLE) monitoring kit 1 each by Does not apply route 4 (four) times daily - after meals and at bedtime. 1 month Diabetic Testing Supplies for QAC-QHS accuchecks. Any manufacturer is acceptable. 04/09/13  Yes Reyne Dumas, MD  guaiFENesin-dextromethorphan (ROBITUSSIN DM) 100-10 MG/5ML syrup Take 5 mLs by mouth every 4 (four) hours as needed for cough. 08/12/13  Yes Tresa Garter, MD  hydrochlorothiazide (HYDRODIURIL) 25 MG tablet Take 1 tablet (25  mg total) by mouth daily. 02/16/15  Yes Tresa Garter, MD  hydrOXYzine (ATARAX/VISTARIL) 10 MG tablet Take 1 tablet (10 mg total) by mouth 3 (three) times daily as needed. 03/21/14  Yes Tresa Garter, MD  Menthol-Methyl Salicylate (BEN GAY GREASELESS) 10-15 % greaseless cream Apply 1 application topically 3 (three) times daily as needed for pain (to knee).   Yes Historical Provider, MD  metFORMIN (GLUCOPHAGE) 1000 MG tablet Take 1 tablet (1,000 mg total) by mouth 2 (two) times daily with a meal. 02/16/15  Yes Yazmyne Sara E Doreene Burke, MD  acetaminophen-codeine (TYLENOL #3) 300-30 MG per tablet Take 1 tablet by mouth every 4 (four) hours as needed for moderate pain. Patient not taking: Reported on 03/21/2014 11/16/13   Tresa Garter, MD  amitriptyline (ELAVIL) 75 MG tablet Take 1 tablet (75 mg total) by mouth at bedtime as needed for sleep or pain. 02/16/15   Tresa Garter, MD  Insulin Syringe-Needle U-100 (RELION INSULIN SYR .3CC/29G) 29G X 1/2" 0.3 ML MISC 1 Syringe by Does not apply route as directed. Patient not taking: Reported on 03/21/2014 08/26/12   Clanford Marisa Hua, MD     Objective:   Filed Vitals:   02/16/15 0916  BP: 147/92  Pulse: 99  Temp: 98 F (36.7 C)  TempSrc: Oral  Resp: 18  Height: 5' (1.524 m)  Weight: 204 lb 6.4 oz (92.715 kg)  SpO2: 96%    Exam General appearance : Awake, alert, not in any distress. Speech Clear. Not toxic looking,  Walks with a cane, obese  HEENT: Atraumatic and Normocephalic, pupils equally reactive to light and accomodation Neck: supple, no JVD. No cervical lymphadenopathy.  Chest:Good air entry bilaterally, no added sounds  CVS: S1 S2 regular, no murmurs.  Abdomen: Bowel sounds present, Non tender and not distended with no gaurding, rigidity or rebound. Extremities: B/L Lower Ext shows no edema, both legs are warm to touch Neurology: Awake alert, and oriented X 3, CN II-XII intact, Non focal Skin: Lipoma, midline upper  back around T8  Data Review Lab Results  Component Value Date   HGBA1C 8.40 02/16/2015   HGBA1C 8.10 09/08/2014   HGBA1C 7.5* 02/03/2014     Assessment & Plan   1. Type 2 diabetes mellitus without complication, without long-term current use of insulin (HCC)  - POCT A1C - Glucose (CBG) - metFORMIN (GLUCOPHAGE) 1000 MG tablet; Take 1 tablet (1,000 mg total) by mouth 2 (two) times daily with a meal.  Dispense: 180 tablet; Refill: 3 - glipiZIDE (GLUCOTROL) 10 MG tablet; Take 1 tablet (10 mg total) by mouth 2 (two) times daily before a meal.  Dispense: 180 tablet; Refill: 3 - gabapentin (NEURONTIN) 400 MG capsule; Take 1 capsule (400 mg total) by mouth 3 (  three) times daily.  Dispense: 270 capsule; Refill: 3 - dapagliflozin propanediol (FARXIGA) 10 MG TABS tablet; Take 10 mg by mouth daily.  Dispense: 30 tablet; Refill: 3  Aim for 30 minutes of exercise most days. Rethink what you drink. Water is great! Aim for 2-3 Carb Choices per meal (30-45 grams) +/- 1 either way  Aim for 0-15 Carbs per snack if hungry  Include protein in moderation with your meals and snacks  Consider reading food labels for Total Carbohydrate and Fat Grams of foods  Consider checking BG at alternate times per day  Continue taking medication as directed Be mindful about how much sugar you are adding to beverages and other foods. Fruit Punch - find one with no sugar  Measure and decrease portions of carbohydrate foods  Make your plate and don't go back for seconds   2. Essential hypertension  - hydrochlorothiazide (HYDRODIURIL) 25 MG tablet; Take 1 tablet (25 mg total) by mouth daily.  Dispense: 90 tablet; Refill: 3  We have discussed target BP range and blood pressure goal. I have advised patient to check BP regularly and to call us back or report to clinic if the numbers are consistently higher than 140/90. We discussed the importance of compliance with medical therapy and DASH diet recommended, consequences  of uncontrolled hypertension discussed.   - continue current BP medications  3. Dyslipidemia  - atorvastatin (LIPITOR) 10 MG tablet; Take 1 tablet (10 mg total) by mouth at bedtime.  Dispense: 90 tablet; Refill: 3  To address this please limit saturated fat to no more than 7% of your calories, limit cholesterol to 200 mg/day, increase fiber and exercise as tolerated. If needed we may add another cholesterol lowering medication to your regimen.   4. Chronic low back pain  - gabapentin (NEURONTIN) 400 MG capsule; Take 1 capsule (400 mg total) by mouth 3 (three) times daily.  Dispense: 270 capsule; Refill: 3 Add - amitriptyline (ELAVIL) 75 MG tablet; Take 1 tablet (75 mg total) by mouth at bedtime as needed for sleep or pain.  Dispense: 30 tablet; Refill: 3.. This will serve dual purpose of pain control and depression. It may also help with insomnia. Patient educated about possible side effects and when to stop the medications and reported to clinic  5. Encounter for screening mammogram for breast cancer  - MM Digital Screening; Future  6. Colon cancer screening  - Ambulatory referral to Gastroenterology  7. Lipoma of back  - Ambulatory referral to [place] was counseled on the dangers of tobacco use, and was advised to quit. Reviewed strategies to maximize success, including removing cigarettes and smoking materials from environment, stress management and support of family/friends.   Patient have been counseled extensively about nutrition and exercise  Return in about 3 months (around 05/17/2015) for Hemoglobin A1C and Follow up, DM, Follow up HTN, Follow up Pain and comorbidities.  The patient was given clear instructions to go to ER or return to medical center if symptoms don't improve, worsen or new problems develop. The patient verbalized understanding. The patient was told to call to get lab results if they haven't heard anything in the next week.   This note has  been created with Surveyor, quantity. Any transcriptional errors are unintentional.    Angelica Chessman, MD, [place], Karilyn Cota, [place] and [place], [place]   02/16/2015, 9:46 AM

## 2015-02-16 NOTE — Progress Notes (Signed)
Patient here to FU for A1C,DM,HTN  Patient complains of pain bilaterally in knees scaled at an 8.

## 2015-02-16 NOTE — Patient Instructions (Signed)
Hypertension Hypertension, commonly called high blood pressure, is when the force of blood pumping through your arteries is too strong. Your arteries are the blood vessels that carry blood from your heart throughout your body. A blood pressure reading consists of a higher number over a lower number, such as 110/72. The higher number (systolic) is the pressure inside your arteries when your heart pumps. The lower number (diastolic) is the pressure inside your arteries when your heart relaxes. Ideally you want your blood pressure below 120/80. Hypertension forces your heart to work harder to pump blood. Your arteries may become narrow or stiff. Having untreated or uncontrolled hypertension can cause heart attack, stroke, kidney disease, and other problems. RISK FACTORS Some risk factors for high blood pressure are controllable. Others are not.  Risk factors you cannot control include:   Race. You may be at higher risk if you are African American.  Age. Risk increases with age.  Gender. Men are at higher risk than women before age 45 years. After age 65, women are at higher risk than men. Risk factors you can control include:  Not getting enough exercise or physical activity.  Being overweight.  Getting too much fat, sugar, calories, or salt in your diet.  Drinking too much alcohol. SIGNS AND SYMPTOMS Hypertension does not usually cause signs or symptoms. Extremely high blood pressure (hypertensive crisis) may cause headache, anxiety, shortness of breath, and nosebleed. DIAGNOSIS To check if you have hypertension, your health care provider will measure your blood pressure while you are seated, with your arm held at the level of your heart. It should be measured at least twice using the same arm. Certain conditions can cause a difference in blood pressure between your right and left arms. A blood pressure reading that is higher than normal on one occasion does not mean that you need treatment. If  it is not clear whether you have high blood pressure, you may be asked to return on a different day to have your blood pressure checked again. Or, you may be asked to monitor your blood pressure at home for 1 or more weeks. TREATMENT Treating high blood pressure includes making lifestyle changes and possibly taking medicine. Living a healthy lifestyle can help lower high blood pressure. You may need to change some of your habits. Lifestyle changes may include:  Following the DASH diet. This diet is high in fruits, vegetables, and whole grains. It is low in salt, red meat, and added sugars.  Keep your sodium intake below 2,300 mg per day.  Getting at least 30-45 minutes of aerobic exercise at least 4 times per week.  Losing weight if necessary.  Not smoking.  Limiting alcoholic beverages.  Learning ways to reduce stress. Your health care provider may prescribe medicine if lifestyle changes are not enough to get your blood pressure under control, and if one of the following is true:  You are 18-59 years of age and your systolic blood pressure is above 140.  You are 60 years of age or older, and your systolic blood pressure is above 150.  Your diastolic blood pressure is above 90.  You have diabetes, and your systolic blood pressure is over 140 or your diastolic blood pressure is over 90.  You have kidney disease and your blood pressure is above 140/90.  You have heart disease and your blood pressure is above 140/90. Your personal target blood pressure may vary depending on your medical conditions, your age, and other factors. HOME CARE INSTRUCTIONS    Have your blood pressure rechecked as directed by your health care provider.   Take medicines only as directed by your health care provider. Follow the directions carefully. Blood pressure medicines must be taken as prescribed. The medicine does not work as well when you skip doses. Skipping doses also puts you at risk for  problems.  Do not smoke.   Monitor your blood pressure at home as directed by your health care provider. SEEK MEDICAL CARE IF:   You think you are having a reaction to medicines taken.  You have recurrent headaches or feel dizzy.  You have swelling in your ankles.  You have trouble with your vision. SEEK IMMEDIATE MEDICAL CARE IF:  You develop a severe headache or confusion.  You have unusual weakness, numbness, or feel faint.  You have severe chest or abdominal pain.  You vomit repeatedly.  You have trouble breathing. MAKE SURE YOU:   Understand these instructions.  Will watch your condition.  Will get help right away if you are not doing well or get worse.   This information is not intended to replace advice given to you by your health care provider. Make sure you discuss any questions you have with your health care provider.   Document Released: 02/18/2005 Document Revised: 07/05/2014 Document Reviewed: 12/11/2012 Elsevier Interactive Patient Education 2016 [employer] DASH Eating Plan DASH stands for "Dietary Approaches to Stop Hypertension." The DASH eating plan is a healthy eating plan that has been shown to reduce high blood pressure (hypertension). Additional health benefits may include reducing the risk of type 2 diabetes mellitus, heart disease, and stroke. The DASH eating plan may also help with weight loss. WHAT DO I NEED TO KNOW ABOUT THE DASH EATING PLAN? For the DASH eating plan, you will follow these general guidelines:  Choose foods with a percent daily value for sodium of less than 5% (as listed on the food label).  Use salt-free seasonings or herbs instead of table salt or sea salt.  Check with your health care provider or pharmacist before using salt substitutes.  Eat lower-sodium products, often labeled as "lower sodium" or "no salt added."  Eat fresh foods.  Eat more vegetables, fruits, and low-fat dairy products.  Choose whole grains.  Look for the word "whole" as the first word in the ingredient list.  Choose fish and skinless chicken or turkey more often than red meat. Limit fish, poultry, and meat to 6 oz (170 g) each day.  Limit sweets, desserts, sugars, and sugary drinks.  Choose heart-healthy fats.  Limit cheese to 1 oz (28 g) per day.  Eat more home-cooked food and less restaurant, buffet, and fast food.  Limit fried foods.  Cook foods using methods other than frying.  Limit canned vegetables. If you do use them, rinse them well to decrease the sodium.  When eating at a restaurant, ask that your food be prepared with less salt, or no salt if possible. WHAT FOODS CAN I EAT? Seek help from a dietitian for individual calorie needs. Grains Whole grain or whole wheat bread. Brown rice. Whole grain or whole wheat pasta. Quinoa, bulgur, and whole grain cereals. Low-sodium cereals. Corn or whole wheat flour tortillas. Whole grain cornbread. Whole grain crackers. Low-sodium crackers. Vegetables Fresh or frozen vegetables (raw, steamed, roasted, or grilled). Low-sodium or reduced-sodium tomato and vegetable juices. Low-sodium or reduced-sodium tomato sauce and paste. Low-sodium or reduced-sodium canned vegetables.  Fruits All fresh, canned (in natural juice), or frozen fruits. Meat and Other   Protein Products Ground beef (85% or leaner), grass-fed beef, or beef trimmed of fat. Skinless chicken or turkey. Ground chicken or turkey. Pork trimmed of fat. All fish and seafood. Eggs. Dried beans, peas, or lentils. Unsalted nuts and seeds. Unsalted canned beans. Dairy Low-fat dairy products, such as skim or 1% milk, 2% or reduced-fat cheeses, low-fat ricotta or cottage cheese, or plain low-fat yogurt. Low-sodium or reduced-sodium cheeses. Fats and Oils Tub margarines without trans fats. Light or reduced-fat mayonnaise and salad dressings (reduced sodium). Avocado. Safflower, olive, or canola oils. Natural peanut or almond  butter. Other Unsalted popcorn and pretzels. The items listed above may not be a complete list of recommended foods or beverages. Contact your dietitian for more options. WHAT FOODS ARE NOT RECOMMENDED? Grains White bread. White pasta. White rice. Refined cornbread. Bagels and croissants. Crackers that contain trans fat. Vegetables Creamed or fried vegetables. Vegetables in a cheese sauce. Regular canned vegetables. Regular canned tomato sauce and paste. Regular tomato and vegetable juices. Fruits Dried fruits. Canned fruit in light or heavy syrup. Fruit juice. Meat and Other Protein Products Fatty cuts of meat. Ribs, chicken wings, bacon, sausage, bologna, salami, chitterlings, fatback, hot dogs, bratwurst, and packaged luncheon meats. Salted nuts and seeds. Canned beans with salt. Dairy Whole or 2% milk, cream, half-and-half, and cream cheese. Whole-fat or sweetened yogurt. Full-fat cheeses or blue cheese. Nondairy creamers and whipped toppings. Processed cheese, cheese spreads, or cheese curds. Condiments Onion and garlic salt, seasoned salt, table salt, and sea salt. Canned and packaged gravies. Worcestershire sauce. Tartar sauce. Barbecue sauce. Teriyaki sauce. Soy sauce, including reduced sodium. Steak sauce. Fish sauce. Oyster sauce. Cocktail sauce. Horseradish. Ketchup and mustard. Meat flavorings and tenderizers. Bouillon cubes. Hot sauce. Tabasco sauce. Marinades. Taco seasonings. Relishes. Fats and Oils Butter, stick margarine, lard, shortening, ghee, and bacon fat. Coconut, palm kernel, or palm oils. Regular salad dressings. Other Pickles and olives. Salted popcorn and pretzels. The items listed above may not be a complete list of foods and beverages to avoid. Contact your dietitian for more information. WHERE CAN I FIND MORE INFORMATION? National Heart, Lung, and [employer] Institute: www.nhlbi.nih.gov/health/health-topics/topics/dash/   This information is not intended to replace  advice given to you by your health care provider. Make sure you discuss any questions you have with your health care provider.   Document Released: 02/07/2011 Document Revised: 03/11/2014 Document Reviewed: 12/23/2012 Elsevier Interactive Patient Education 2016 Elsevier Inc[employer] Diabetes and Exercise Exercising regularly is important. It is not just about losing weight. It has many health benefits, such as:  Improving your overall fitness, flexibility, and endurance.  Increasing your bone density.  Helping with weight control.  Decreasing your body fat.  Increasing your muscle strength.  Reducing stress and tension.  Improving your overall health. People with diabetes who exercise gain additional benefits because exercise:  Reduces appetite.  Improves the body's use of blood sugar (glucose).  Helps lower or control blood glucose.  Decreases blood pressure.  Helps control blood lipids (such as cholesterol and triglycerides).  Improves the body's use of the hormone insulin by:  Increasing the body's insulin sensitivity.  Reducing the body's insulin needs.  Decreases the risk for heart disease because exercising:  Lowers cholesterol and triglycerides levels.  Increases the levels of good cholesterol (such as high-density lipoproteins [HDL]) in the body.  Lowers blood glucose levels. YOUR ACTIVITY PLAN  Choose an activity that you enjoy, and set realistic goals. To exercise safely, you should begin practicing any new physical activity   slowly, and gradually increase the intensity of the exercise over time. Your health care provider or diabetes educator can help create an activity plan that works for you. General recommendations include:  Encouraging children to engage in at least 60 minutes of physical activity each day.  Stretching and performing strength training exercises, such as yoga or weight lifting, at least 2 times per week.  Performing a total of at least 150  minutes of moderate-intensity exercise each week, such as brisk walking or water aerobics.  Exercising at least 3 days per week, making sure you allow no more than 2 consecutive days to pass without exercising.  Avoiding long periods of inactivity (90 minutes or more). When you have to spend an extended period of time sitting down, take frequent breaks to walk or stretch. RECOMMENDATIONS FOR EXERCISING WITH TYPE 1 OR TYPE 2 DIABETES   Check your blood glucose before exercising. If blood glucose levels are greater than 240 mg/dL, check for urine ketones. Do not exercise if ketones are present.  Avoid injecting insulin into areas of the body that are going to be exercised. For example, avoid injecting insulin into:  The arms when playing tennis.  The legs when jogging.  Keep a record of:  Food intake before and after you exercise.  Expected peak times of insulin action.  Blood glucose levels before and after you exercise.  The type and amount of exercise you have done.  Review your records with your health care provider. Your health care provider will help you to develop guidelines for adjusting food intake and insulin amounts before and after exercising.  If you take insulin or oral hypoglycemic agents, watch for signs and symptoms of hypoglycemia. They include:  Dizziness.  Shaking.  Sweating.  Chills.  Confusion.  Drink plenty of water while you exercise to prevent dehydration or heat stroke. Body water is lost during exercise and must be replaced.  Talk to your health care provider before starting an exercise program to make sure it is safe for you. Remember, almost any type of activity is better than none.   This information is not intended to replace advice given to you by your health care provider. Make sure you discuss any questions you have with your health care provider.   Document Released: 05/11/2003 Document Revised: 07/05/2014 Document Reviewed:  07/28/2012 Elsevier Interactive Patient Education 2016 Elsevier Inc. Basic Carbohydrate Counting for Diabetes Mellitus Carbohydrate counting is a method for keeping track of the amount of carbohydrates you eat. Eating carbohydrates naturally increases the level of sugar (glucose) in your blood, so it is important for you to know the amount that is okay for you to have in every meal. Carbohydrate counting helps keep the level of glucose in your blood within normal limits. The amount of carbohydrates allowed is different for every person. A dietitian can help you calculate the amount that is right for you. Once you know the amount of carbohydrates you can have, you can count the carbohydrates in the foods you want to eat. Carbohydrates are found in the following foods:  Grains, such as breads and cereals.  Dried beans and soy products.  Starchy vegetables, such as potatoes, peas, and corn.  Fruit and fruit juices.  Milk and yogurt.  Sweets and snack foods, such as cake, cookies, candy, chips, soft drinks, and fruit drinks. CARBOHYDRATE COUNTING There are two ways to count the carbohydrates in your food. You can use either of the methods or a combination of both.   Reading the "Nutrition Facts" on Packaged Food The "Nutrition Facts" is an area that is included on the labels of almost all packaged food and beverages in the [place]. It includes the serving size of that food or beverage and information about the nutrients in each serving of the food, including the grams (g) of carbohydrate per serving.  Decide the number of servings of this food or beverage that you will be able to eat or drink. Multiply that number of servings by the number of grams of carbohydrate that is listed on the label for that serving. The total will be the amount of carbohydrates you will be having when you eat or drink this food or beverage. Learning Standard Serving Sizes of Food When you eat food that is not  packaged or does not include "Nutrition Facts" on the label, you need to measure the servings in order to count the amount of carbohydrates.A serving of most carbohydrate-rich foods contains about 15 g of carbohydrates. The following list includes serving sizes of carbohydrate-rich foods that provide 15 g ofcarbohydrate per serving:   1 slice of bread (1 oz) or 1 six-inch tortilla.    of a hamburger bun or English muffin.  4-6 crackers.   cup unsweetened dry cereal.    cup hot cereal.   cup rice or pasta.    cup mashed potatoes or  of a large baked potato.  1 cup fresh fruit or one small piece of fruit.    cup canned or frozen fruit or fruit juice.  1 cup milk.   cup plain fat-free yogurt or yogurt sweetened with artificial sweeteners.   cup cooked dried beans or starchy vegetable, such as peas, corn, or potatoes.  Decide the number of standard-size servings that you will eat. Multiply that number of servings by 15 (the grams of carbohydrates in that serving). For example, if you eat 2 cups of strawberries, you will have eaten 2 servings and 30 g of carbohydrates (2 servings x 15 g = 30 g). For foods such as soups and casseroles, in which more than one food is mixed in, you will need to count the carbohydrates in each food that is included. EXAMPLE OF CARBOHYDRATE COUNTING Sample Dinner  3 oz chicken breast.   cup of brown rice.   cup of corn.  1 cup milk.   1 cup strawberries with sugar-free whipped topping.  Carbohydrate Calculation Step 1: Identify the foods that contain carbohydrates:   Rice.   Corn.   Milk.   Strawberries. Step 2:Calculate the number of servings eaten of each:   2 servings of rice.   1 serving of corn.   1 serving of milk.   1 serving of strawberries. Step 3: Multiply each of those number of servings by 15 g:   2 servings of rice x 15 g = 30 g.   1 serving of corn x 15 g = 15 g.   1 serving of milk x 15  g = 15 g.   1 serving of strawberries x 15 g = 15 g. Step 4: Add together all of the amounts to find the total grams of carbohydrates eaten: 30 g + 15 g + 15 g + 15 g = 75 g.   This information is not intended to replace advice given to you by your health care provider. Make sure you discuss any questions you have with your health care provider.   Document Released: 02/18/2005 Document Revised: 03/11/2014 Document Reviewed:   01/15/2013 Elsevier Interactive Patient Education 2016 Elsevier Inc.  

## 2015-02-23 NOTE — Telephone Encounter (Signed)
error 

## 2015-03-16 MED FILL — FARXIGA 10 MG TABLET: 10 | 30 days supply | Qty: 30 | Fill #1

## 2015-03-16 MED FILL — GABAPENTIN 400 MG CAPSULE: 400 | 30 days supply | Qty: 90 | Fill #0

## 2015-03-27 MED FILL — traMADol HCL 50 MG TABS: 50 | 15 days supply | Qty: 60 | Fill #0

## 2015-03-31 MED FILL — metFORMIN HCL 1000 MG TABS: 1000 | 30 days supply | Qty: 60 | Fill #1

## 2015-03-31 MED FILL — FLUCONAZOLE 150 MG TABLET: 150 | 1 days supply | Qty: 1 | Fill #0

## 2015-03-31 MED FILL — glipiZIDE 10 MG TABS: 10 | 30 days supply | Qty: 60 | Fill #1

## 2015-04-14 MED FILL — FARXIGA 10 MG TABLET: 10 | 30 days supply | Qty: 30 | Fill #2

## 2015-04-28 MED FILL — GABAPENTIN 400 MG CAPSULE: 400 | 30 days supply | Qty: 90 | Fill #1

## 2015-05-12 MED FILL — glipiZIDE 10 MG TABS: 10 | 30 days supply | Qty: 60 | Fill #2

## 2015-05-15 MED FILL — metFORMIN HCL 1000 MG TABS: 1000 | 30 days supply | Qty: 60 | Fill #2

## 2015-05-15 MED FILL — FARXIGA 10 MG TABLET: 10 | 30 days supply | Qty: 30 | Fill #3

## 2015-05-29 MED FILL — GABAPENTIN 400 MG CAPSULE: 400 | 30 days supply | Qty: 90 | Fill #2

## 2015-05-29 MED FILL — traMADol HCL 50 MG TABS: 50 | 15 days supply | Qty: 60 | Fill #0

## 2015-06-12 MED FILL — FARXIGA 10 MG TABLET: 10 | 30 days supply | Qty: 30 | Fill #0

## 2015-06-12 MED FILL — metFORMIN HCL 1000 MG TABS: 1000 | 30 days supply | Qty: 60 | Fill #3

## 2015-06-12 MED FILL — glipiZIDE 10 MG TABS: 10 | 30 days supply | Qty: 60 | Fill #3

## 2015-06-23 MED FILL — GABAPENTIN 400 MG CAPSULE: 400 | 30 days supply | Qty: 90 | Fill #3

## 2015-07-12 MED FILL — FARXIGA 10 MG TABLET: 10 | 30 days supply | Qty: 30 | Fill #1

## 2015-07-13 MED FILL — traMADol HCL 50 MG TABS: 50 | 15 days supply | Qty: 60 | Fill #0

## 2015-08-11 MED FILL — metFORMIN HCL 1000 MG TABS: 1000 | 30 days supply | Qty: 60 | Fill #4

## 2015-08-11 MED FILL — glipiZIDE 10 MG TABS: 10 | 30 days supply | Qty: 60 | Fill #4

## 2015-08-11 MED FILL — GABAPENTIN 400 MG CAPSULE: 400 | 30 days supply | Qty: 90 | Fill #4

## 2015-08-15 MED FILL — FARXIGA 10 MG TABLET: 10 | 30 days supply | Qty: 30 | Fill #2

## 2015-08-28 MED FILL — traMADol HCL 50 MG TABS: 50 | 15 days supply | Qty: 60 | Fill #0

## 2015-09-22 ENCOUNTER — Other Ambulatory Visit: Payer: Self-pay | Admitting: Internal Medicine

## 2015-09-22 MED FILL — metFORMIN HCL 1000 MG TABS: 1000 | 30 days supply | Qty: 60 | Fill #5

## 2015-09-22 MED FILL — glipiZIDE 10 MG TABS: 10 | 30 days supply | Qty: 60 | Fill #5

## 2015-09-22 MED FILL — GABAPENTIN 400 MG CAPSULE: 400 | 30 days supply | Qty: 90 | Fill #5

## 2015-09-22 NOTE — Telephone Encounter (Signed)
Refill Wilder Glade

## 2015-09-25 MED FILL — FARXIGA 10 MG TABLET: 10 | 30 days supply | Qty: 30 | Fill #0

## 2015-10-05 MED FILL — traMADol HCL 50 MG TABS: 50 | 15 days supply | Qty: 60 | Fill #0

## 2015-10-16 ENCOUNTER — Ambulatory Visit: Payer: Self-pay | Attending: Internal Medicine

## 2015-10-16 ENCOUNTER — Ambulatory Visit: Payer: Self-pay

## 2015-10-17 ENCOUNTER — Ambulatory Visit: Payer: Self-pay | Attending: Internal Medicine

## 2015-10-17 DIAGNOSIS — Z0000 Encounter for general adult medical examination without abnormal findings: Secondary | ICD-10-CM

## 2015-10-17 NOTE — Progress Notes (Signed)
Joy Solis, 50 y.o. 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 Pt taken PPD test before: yes. Verified in allergy area and with patient that they are not allergic to the products PPD is made of (Phenol or Tween)-patient unsure. No, patient taking any oral or IV steroid medication now or have they taken it in the last month.No, patient ever received the BCG vaccine. No patient been in recent contact with anyone known or suspected of having active TB disease. Alert and oriented in NAD. VIS sheet provided. 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 Patient verbalized understanding. Priscille Heidelberg, RN, BSN

## 2015-10-19 ENCOUNTER — Other Ambulatory Visit: Payer: Self-pay | Admitting: Internal Medicine

## 2015-10-19 ENCOUNTER — Ambulatory Visit (HOSPITAL_COMMUNITY)
Admission: RE | Admit: 2015-10-19 | Discharge: 2015-10-19 | Disposition: A | Discharge status: Home / Self Care | Payer: Self-pay | Source: Ambulatory Visit | Attending: Internal Medicine | Admitting: Internal Medicine

## 2015-10-19 ENCOUNTER — Ambulatory Visit: Payer: Self-pay | Attending: Internal Medicine

## 2015-10-19 DIAGNOSIS — R7611 Nonspecific reaction to tuberculin skin test without active tuberculosis: Secondary | ICD-10-CM

## 2015-10-19 DIAGNOSIS — R918 Other nonspecific abnormal finding of lung field: Secondary | ICD-10-CM | POA: Insufficient documentation

## 2015-10-19 DIAGNOSIS — Z9289 Personal history of other medical treatment: Secondary | ICD-10-CM

## 2015-10-19 LAB — TB SKIN TEST: TB Skin Test: POSITIVE

## 2015-10-19 NOTE — Patient Instructions (Addendum)
Patient instructed and agreed to have CXR today  Patient advised CXR shows "no active disease" and cleared to work with no restrictions or limitations. Priscille Heidelberg, RN, BSN

## 2015-10-19 NOTE — Progress Notes (Signed)
Left voice mail for patient advised: Letter upfront for pick-up. Priscille Heidelberg, RN, BSN

## 2015-10-19 NOTE — Progress Notes (Addendum)
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  Dr. Doreene Burke notified of patient results. 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  And was told to just have CXR for TB screenings.  CXR - PA/Lat ordered.Patient instructed and agreed to have CXR today  Discussed CXR results with Dr. Doreene Burke.  Patient cleared no active pulmonary disease.   S/w Tammy Faucettee w/ [facility]; TB referral faxed to [facility] at [phone] for patient follow up.  Priscille Heidelberg, RN, BSN

## 2015-10-25 MED FILL — FARXIGA 10 MG TABLET: 10 | 30 days supply | Qty: 30 | Fill #1

## 2015-11-02 ENCOUNTER — Ambulatory Visit: Payer: Self-pay | Attending: Internal Medicine | Admitting: Internal Medicine

## 2015-11-02 ENCOUNTER — Encounter: Payer: Self-pay | Admitting: Internal Medicine

## 2015-11-02 ENCOUNTER — Other Ambulatory Visit: Payer: Self-pay | Admitting: Internal Medicine

## 2015-11-02 VITALS — BP 116/74 | HR 83 | Temp 98.6°F | Resp 18 | Ht 59.0 in | Wt 193.8 lb

## 2015-11-02 DIAGNOSIS — E119 Type 2 diabetes mellitus without complications: Secondary | ICD-10-CM | POA: Insufficient documentation

## 2015-11-02 DIAGNOSIS — Z888 Allergy status to other drugs, medicaments and biological substances status: Secondary | ICD-10-CM | POA: Insufficient documentation

## 2015-11-02 DIAGNOSIS — E785 Hyperlipidemia, unspecified: Secondary | ICD-10-CM | POA: Insufficient documentation

## 2015-11-02 DIAGNOSIS — G8929 Other chronic pain: Secondary | ICD-10-CM | POA: Insufficient documentation

## 2015-11-02 DIAGNOSIS — Z1231 Encounter for screening mammogram for malignant neoplasm of breast: Secondary | ICD-10-CM

## 2015-11-02 DIAGNOSIS — K219 Gastro-esophageal reflux disease without esophagitis: Secondary | ICD-10-CM | POA: Insufficient documentation

## 2015-11-02 DIAGNOSIS — F17210 Nicotine dependence, cigarettes, uncomplicated: Secondary | ICD-10-CM | POA: Insufficient documentation

## 2015-11-02 DIAGNOSIS — I10 Essential (primary) hypertension: Secondary | ICD-10-CM | POA: Insufficient documentation

## 2015-11-02 DIAGNOSIS — F329 Major depressive disorder, single episode, unspecified: Secondary | ICD-10-CM | POA: Insufficient documentation

## 2015-11-02 DIAGNOSIS — Z1211 Encounter for screening for malignant neoplasm of colon: Secondary | ICD-10-CM

## 2015-11-02 DIAGNOSIS — M170 Bilateral primary osteoarthritis of knee: Secondary | ICD-10-CM | POA: Insufficient documentation

## 2015-11-02 DIAGNOSIS — G500 Trigeminal neuralgia: Secondary | ICD-10-CM | POA: Insufficient documentation

## 2015-11-02 DIAGNOSIS — M4806 Spinal stenosis, lumbar region: Secondary | ICD-10-CM | POA: Insufficient documentation

## 2015-11-02 DIAGNOSIS — Z7982 Long term (current) use of aspirin: Secondary | ICD-10-CM | POA: Insufficient documentation

## 2015-11-02 DIAGNOSIS — Z794 Long term (current) use of insulin: Secondary | ICD-10-CM | POA: Insufficient documentation

## 2015-11-02 DIAGNOSIS — G709 Myoneural disorder, unspecified: Secondary | ICD-10-CM | POA: Insufficient documentation

## 2015-11-02 DIAGNOSIS — F4323 Adjustment disorder with mixed anxiety and depressed mood: Secondary | ICD-10-CM | POA: Insufficient documentation

## 2015-11-02 DIAGNOSIS — M545 Low back pain: Secondary | ICD-10-CM | POA: Insufficient documentation

## 2015-11-02 LAB — CBC WITH DIFFERENTIAL/PLATELET
Basophils Absolute: 0 {cells}/uL (ref 0–200)
Basophils Relative: 0 %
Eosinophils Absolute: 270 {cells}/uL (ref 15–500)
Eosinophils Relative: 3 %
HCT: 41.4 % (ref 35.0–45.0)
Hemoglobin: 13.5 g/dL (ref 11.7–15.5)
Lymphocytes Relative: 26 %
Lymphs Abs: 2340 {cells}/uL (ref 850–3900)
MCH: 27.1 pg (ref 27.0–33.0)
MCHC: 32.6 g/dL (ref 32.0–36.0)
MCV: 83.1 fL (ref 80.0–100.0)
MPV: 9.7 fL (ref 7.5–12.5)
Monocytes Absolute: 630 {cells}/uL (ref 200–950)
Monocytes Relative: 7 %
Neutro Abs: 5760 {cells}/uL (ref 1500–7800)
Neutrophils Relative %: 64 %
Platelets: 434 K/uL — ABNORMAL HIGH (ref 140–400)
RBC: 4.98 MIL/uL (ref 3.80–5.10)
RDW: 15 % (ref 11.0–15.0)
WBC: 9 K/uL (ref 3.8–10.8)

## 2015-11-02 LAB — COMPLETE METABOLIC PANEL WITHOUT GFR
ALT: 12 U/L (ref 6–29)
AST: 11 U/L (ref 10–35)
Albumin: 4.1 g/dL (ref 3.6–5.1)
Alkaline Phosphatase: 86 U/L (ref 33–130)
BUN: 16 mg/dL (ref 7–25)
CO2: 23 mmol/L (ref 20–31)
Calcium: 9.3 mg/dL (ref 8.6–10.4)
Chloride: 106 mmol/L (ref 98–110)
Creat: 0.65 mg/dL (ref 0.50–1.05)
GFR, Est African American: >89 mL/min
GFR, Est Non African American: >89 mL/min
Glucose, Bld: 147 mg/dL — ABNORMAL HIGH (ref 65–99)
Potassium: 4.1 mmol/L (ref 3.5–5.3)
Sodium: 140 mmol/L (ref 135–146)
Total Bilirubin: 0.4 mg/dL (ref 0.2–1.2)
Total Protein: 6.8 g/dL (ref 6.1–8.1)

## 2015-11-02 LAB — GLUCOSE, POCT (MANUAL RESULT ENTRY): POC GLUCOSE: 189 mg/dL — AB (ref 70–99)

## 2015-11-02 LAB — POCT GLYCOSYLATED HEMOGLOBIN (HGB A1C): Hemoglobin A1C: 8.3

## 2015-11-02 LAB — TSH: TSH: 2.35 m[IU]/L

## 2015-11-02 MED ORDER — ACETAMINOPHEN-CODEINE #3 300-30 MG PO TABS: 1.0000 | ORAL_TABLET | ORAL | 0 refills | Status: DC | PRN

## 2015-11-02 MED ORDER — METFORMIN HCL 1000 MG PO TABS: 1000.0000 mg | ORAL_TABLET | Freq: Two times a day (BID) | ORAL | 3 refills | Status: DC

## 2015-11-02 MED ORDER — CYCLOBENZAPRINE HCL 10 MG PO TABS: 10.0000 mg | ORAL_TABLET | Freq: Three times a day (TID) | ORAL | 0 refills | Status: DC | PRN

## 2015-11-02 MED ORDER — GABAPENTIN 400 MG PO CAPS: 400.0000 mg | ORAL_CAPSULE | Freq: Three times a day (TID) | ORAL | 3 refills | Status: DC

## 2015-11-02 MED ORDER — GLIPIZIDE 10 MG PO TABS: 10.0000 mg | ORAL_TABLET | Freq: Two times a day (BID) | ORAL | 3 refills | Status: DC

## 2015-11-02 MED ORDER — HYDROCHLOROTHIAZIDE 25 MG PO TABS: 25.0000 mg | ORAL_TABLET | Freq: Every day | ORAL | 3 refills | Status: DC

## 2015-11-02 MED ORDER — ATORVASTATIN CALCIUM 10 MG PO TABS: 10.0000 mg | ORAL_TABLET | Freq: Every day | ORAL | 3 refills | Status: DC

## 2015-11-02 MED ORDER — DULOXETINE HCL 30 MG PO CPEP: 30.0000 mg | ORAL_CAPSULE | Freq: Every day | ORAL | 3 refills | Status: DC

## 2015-11-02 MED ORDER — DAPAGLIFLOZIN PROPANEDIOL 10 MG PO TABS: 10.0000 mg | ORAL_TABLET | Freq: Every day | ORAL | 3 refills | Status: DC

## 2015-11-02 MED ORDER — HYDROXYZINE HCL 10 MG PO TABS: 10.0000 mg | ORAL_TABLET | Freq: Three times a day (TID) | ORAL | 3 refills | Status: DC | PRN

## 2015-11-02 MED ORDER — TRAZODONE HCL 50 MG PO TABS: 50.0000 mg | ORAL_TABLET | Freq: Every evening | ORAL | 3 refills | Status: DC | PRN

## 2015-11-02 MED FILL — glipiZIDE 10 MG TABS: 10 | 30 days supply | Qty: 60 | Fill #0

## 2015-11-02 MED FILL — metFORMIN HCL 1000 MG TABS: 1000 | 30 days supply | Qty: 60 | Fill #0

## 2015-11-02 MED FILL — ATORVASTATIN 10 MG TABLET: 10 | 30 days supply | Qty: 30 | Fill #0

## 2015-11-02 MED FILL — DULoxetine HCL 30 MG CPEP: 30 | 30 days supply | Qty: 30 | Fill #0

## 2015-11-02 MED FILL — traZODone HCL 50 MG TABS: 50 | 30 days supply | Qty: 30 | Fill #0

## 2015-11-02 NOTE — Progress Notes (Signed)
Patient is here for DM FU  Patient complains of right knee pain being present.  Patient has taken medication today and patient has not eaten.  Patient states she plans to end her life sometimes.  Patient would like the flu shot today.

## 2015-11-02 NOTE — Patient Instructions (Signed)
Basic Carbohydrate Counting for Diabetes Mellitus Carbohydrate counting is a method for keeping track of the amount of carbohydrates you eat. Eating carbohydrates naturally increases the level of sugar (glucose) in your blood, so it is important for you to know the amount that is okay for you to have in every meal. Carbohydrate counting helps keep the level of glucose in your blood within normal limits. The amount of carbohydrates allowed is different for every person. A dietitian can help you calculate the amount that is right for you. Once you know the amount of carbohydrates you can have, you can count the carbohydrates in the foods you want to eat. Carbohydrates are found in the following foods:  Grains, such as breads and cereals.  Dried beans and soy products.  Starchy vegetables, such as potatoes, peas, and corn.  Fruit and fruit juices.  Milk and yogurt.  Sweets and snack foods, such as cake, cookies, candy, chips, soft drinks, and fruit drinks. CARBOHYDRATE COUNTING There are two ways to count the carbohydrates in your food. You can use either of the methods or a combination of both. Reading the "Nutrition Facts" on Packaged Food The "Nutrition Facts" is an area that is included on the labels of almost all packaged food and beverages in the [place]. It includes the serving size of that food or beverage and information about the nutrients in each serving of the food, including the grams (g) of carbohydrate per serving.  Decide the number of servings of this food or beverage that you will be able to eat or drink. Multiply that number of servings by the number of grams of carbohydrate that is listed on the label for that serving. The total will be the amount of carbohydrates you will be having when you eat or drink this food or beverage. Learning Standard Serving Sizes of Food When you eat food that is not packaged or does not include "Nutrition Facts" on the label, you need to  measure the servings in order to count the amount of carbohydrates.A serving of most carbohydrate-rich foods contains about 15 g of carbohydrates. The following list includes serving sizes of carbohydrate-rich foods that provide 15 g ofcarbohydrate per serving:   1 slice of bread (1 oz) or 1 six-inch tortilla.    of a hamburger bun or English muffin.  4-6 crackers.   cup unsweetened dry cereal.    cup hot cereal.   cup rice or pasta.    cup mashed potatoes or  of a large baked potato.  1 cup fresh fruit or one small piece of fruit.    cup canned or frozen fruit or fruit juice.  1 cup milk.   cup plain fat-free yogurt or yogurt sweetened with artificial sweeteners.   cup cooked dried beans or starchy vegetable, such as peas, corn, or potatoes.  Decide the number of standard-size servings that you will eat. Multiply that number of servings by 15 (the grams of carbohydrates in that serving). For example, if you eat 2 cups of strawberries, you will have eaten 2 servings and 30 g of carbohydrates (2 servings x 15 g = 30 g). For foods such as soups and casseroles, in which more than one food is mixed in, you will need to count the carbohydrates in each food that is included. EXAMPLE OF CARBOHYDRATE COUNTING Sample Dinner  3 oz chicken breast.   cup of brown rice.   cup of corn.  1 cup milk.   1 cup strawberries with   sugar-free whipped topping.  Carbohydrate Calculation Step 1: Identify the foods that contain carbohydrates:   Rice.   Corn.   Milk.   Strawberries. Step 2:Calculate the number of servings eaten of each:   2 servings of rice.   1 serving of corn.   1 serving of milk.   1 serving of strawberries. Step 3: Multiply each of those number of servings by 15 g:   2 servings of rice x 15 g = 30 g.   1 serving of corn x 15 g = 15 g.   1 serving of milk x 15 g = 15 g.   1 serving of strawberries x 15 g = 15 g. Step 4: Add  together all of the amounts to find the total grams of carbohydrates eaten: 30 g + 15 g + 15 g + 15 g = 75 g.   This information is not intended to replace advice given to you by your health care provider. Make sure you discuss any questions you have with your health care provider.   Document Released: 02/18/2005 Document Revised: 03/11/2014 Document Reviewed: 01/15/2013 Elsevier Interactive Patient Education 2016 [employer]. Smoking Cessation, Tips for Success If you are ready to quit smoking, congratulations! You have chosen to help yourself be healthier. Cigarettes bring nicotine, tar, carbon monoxide, and other irritants into your body. Your lungs, heart, and blood vessels will be able to work better without these poisons. There are many different ways to quit smoking. Nicotine gum, nicotine patches, a nicotine inhaler, or nicotine nasal spray can help with physical craving. Hypnosis, support groups, and medicines help break the habit of smoking. WHAT THINGS CAN I DO TO MAKE QUITTING EASIER?  Here are some tips to help you quit for good:  Pick a date when you will quit smoking completely. Tell all of your friends and family about your plan to quit on that date.  Do not try to slowly cut down on the number of cigarettes you are smoking. Pick a quit date and quit smoking completely starting on that day.  Throw away all cigarettes.   Clean and remove all ashtrays from your home, work, and car.  On a card, write down your reasons for quitting. Carry the card with you and read it when you get the urge to smoke.  Cleanse your body of nicotine. Drink enough water and fluids to keep your urine clear or pale yellow. Do this after quitting to flush the nicotine from your body.  Learn to predict your moods. Do not let a bad situation be your excuse to have a cigarette. Some situations in your life might tempt you into wanting a cigarette.  Never have "just one" cigarette. It leads to wanting  another and another. Remind yourself of your decision to quit.  Change habits associated with smoking. If you smoked while driving or when feeling stressed, try other activities to replace smoking. Stand up when drinking your coffee. Brush your teeth after eating. Sit in a different chair when you read the paper. Avoid alcohol while trying to quit, and try to drink fewer caffeinated beverages. Alcohol and caffeine may urge you to smoke.  Avoid foods and drinks that can trigger a desire to smoke, such as sugary or spicy foods and alcohol.  Ask people who smoke not to smoke around you.  Have something planned to do right after eating or having a cup of coffee. For example, plan to take a walk or exercise.  Try a  relaxation exercise to calm you down and decrease your stress. Remember, you may be tense and nervous for the first 2 weeks after you quit, but this will pass.  Find new activities to keep your hands busy. Play with a pen, coin, or rubber band. Doodle or draw things on paper.  Brush your teeth right after eating. This will help cut down on the craving for the taste of tobacco after meals. You can also try mouthwash.   Use oral substitutes in place of cigarettes. Try using lemon drops, carrots, cinnamon sticks, or chewing gum. Keep them handy so they are available when you have the urge to smoke.  When you have the urge to smoke, try deep breathing.  Designate your home as a nonsmoking area.  If you are a heavy smoker, ask your health care provider about a prescription for nicotine chewing gum. It can ease your withdrawal from nicotine.  Reward yourself. Set aside the cigarette money you save and buy yourself something nice.  Look for support from others. Join a support group or smoking cessation program. Ask someone at home or at work to help you with your plan to quit smoking.  Always ask yourself, "Do I need this cigarette or is this just a reflex?" Tell yourself, "Today, I  choose not to smoke," or "I do not want to smoke." You are reminding yourself of your decision to quit.  Do not replace cigarette smoking with electronic cigarettes (commonly called e-cigarettes). The safety of e-cigarettes is unknown, and some may contain harmful chemicals.  If you relapse, do not give up! Plan ahead and think about what you will do the next time you get the urge to smoke. HOW WILL I FEEL WHEN I QUIT SMOKING? You may have symptoms of withdrawal because your body is used to nicotine (the addictive substance in cigarettes). You may crave cigarettes, be irritable, feel very hungry, cough often, get headaches, or have difficulty concentrating. The withdrawal symptoms are only temporary. They are strongest when you first quit but will go away within 10-14 days. When withdrawal symptoms occur, stay in control. Think about your reasons for quitting. Remind yourself that these are signs that your body is healing and getting used to being without cigarettes. Remember that withdrawal symptoms are easier to treat than the major diseases that smoking can cause.  Even after the withdrawal is over, expect periodic urges to smoke. However, these cravings are generally short lived and will go away whether you smoke or not. Do not smoke! WHAT RESOURCES ARE AVAILABLE TO HELP ME QUIT SMOKING? Your health care provider can direct you to community resources or hospitals for support, which may include:  Group support.  Education.  Hypnosis.  Therapy.   This information is not intended to replace advice given to you by your health care provider. Make sure you discuss any questions you have with your health care provider.   Document Released: 11/17/2003 Document Revised: 03/11/2014 Document Reviewed: 08/06/2012 Elsevier Interactive Patient Education 2016 [employer] Major Depressive Disorder Major depressive disorder is a mental illness. It also may be called clinical depression or unipolar  depression. Major depressive disorder usually causes feelings of sadness, hopelessness, or helplessness. Some people with this disorder do not feel particularly sad but lose interest in doing things they used to enjoy (anhedonia). Major depressive disorder also can cause physical symptoms. It can interfere with work, school, relationships, and other normal everyday activities. The disorder varies in severity but is longer lasting and  more serious than the sadness we all feel from time to time in our lives. Major depressive disorder often is triggered by stressful life events or major life changes. Examples of these triggers include divorce, loss of your job or home, a move, and the death of a family member or close friend. Sometimes this disorder occurs for no obvious reason at all. People who have family members with major depressive disorder or bipolar disorder are at higher risk for developing this disorder, with or without life stressors. Major depressive disorder can occur at any age. It may occur just once in your life (single episode major depressive disorder). It may occur multiple times (recurrent major depressive disorder). SYMPTOMS People with major depressive disorder have either anhedonia or depressed mood on nearly a daily basis for at least 2 weeks or longer. Symptoms of depressed mood include:  Feelings of sadness (blue or down in the dumps) or emptiness.  Feelings of hopelessness or helplessness.  Tearfulness or episodes of crying (may be observed by others).  Irritability (children and adolescents). In addition to depressed mood or anhedonia or both, people with this disorder have at least four of the following symptoms:  Difficulty sleeping or sleeping too much.   Significant change (increase or decrease) in appetite or weight.   Lack of energy or motivation.  Feelings of guilt and worthlessness.   Difficulty concentrating, remembering, or making decisions.  Unusually  slow movement (psychomotor retardation) or restlessness (as observed by others).   Recurrent wishes for death, recurrent thoughts of self-harm (suicide), or a suicide attempt. People with major depressive disorder commonly have persistent negative thoughts about themselves, other people, and the world. People with severe major depressive disorder may experiencedistorted beliefs or perceptions about the world (psychotic delusions). They also may see or hear things that are not real (psychotic hallucinations). DIAGNOSIS Major depressive disorder is diagnosed through an assessment by your health care provider. Your health care provider will ask aboutaspects of your daily life, such as mood,sleep, and appetite, to see if you have the diagnostic symptoms of major depressive disorder. Your health care provider may ask about your medical history and use of alcohol or drugs, including prescription medicines. Your health care provider also may do a physical exam and blood work. This is because certain medical conditions and the use of certain substances can cause major depressive disorder-like symptoms (secondary depression). Your health care provider also may refer you to a mental health specialist for further evaluation and treatment. TREATMENT It is important to recognize the symptoms of major depressive disorder and seek treatment. The following treatments can be prescribed for this disorder:   Medicine. Antidepressant medicines usually are prescribed. Antidepressant medicines are thought to correct chemical imbalances in the brain that are commonly associated with major depressive disorder. Other types of medicine may be added if the symptoms do not respond to antidepressant medicines alone or if psychotic delusions or hallucinations occur.  Talk therapy. Talk therapy can be helpful in treating major depressive disorder by providing support, education, and guidance. Certain types of talk therapy also can  help with negative thinking (cognitive behavioral therapy) and with relationship issues that trigger this disorder (interpersonal therapy). A mental health specialist can help determine which treatment is best for you. Most people with major depressive disorder do well with a combination of medicine and talk therapy. Treatments involving electrical stimulation of the brain can be used in situations with extremely severe symptoms or when medicine and talk therapy do not work  over time. These treatments include electroconvulsive therapy, transcranial magnetic stimulation, and vagal nerve stimulation.   This information is not intended to replace advice given to you by your health care provider. Make sure you discuss any questions you have with your health care provider.   Document Released: 06/15/2012 Document Revised: 03/11/2014 Document Reviewed: 06/15/2012 Elsevier Interactive Patient Education 2016 [employer] Inc[employer] Generalized Anxiety Disorder Generalized anxiety disorder (GAD) is a mental disorder. It interferes with life functions, including relationships, work, and school. GAD is different from normal anxiety, which everyone experiences at some point in their lives in response to specific life events and activities. Normal anxiety actually helps [place] prepare for and get through these life events and activities. Normal anxiety goes away after the event or activity is over.  GAD causes anxiety that is not necessarily related to specific events or activities. It also causes excess anxiety in proportion to specific events or activities. The anxiety associated with GAD is also difficult to control. GAD can vary from mild to severe. People with severe GAD can have intense waves of anxiety with physical symptoms (panic attacks).  SYMPTOMS The anxiety and worry associated with GAD are difficult to control. This anxiety and worry are related to many life events and activities and also occur more days than not  for 6 months or longer. People with GAD also have three or more of the following symptoms (one or more in children):  Restlessness.   Fatigue.  Difficulty concentrating.   Irritability.  Muscle tension.  Difficulty sleeping or unsatisfying sleep. DIAGNOSIS GAD is diagnosed through an assessment by your health care provider. Your health care provider will ask you questions aboutyour mood,physical symptoms, and events in your life. Your health care provider may ask you about your medical history and use of alcohol or drugs, including prescription medicines. Your health care provider may also do a physical exam and blood tests. Certain medical conditions and the use of certain substances can cause symptoms similar to those associated with GAD. Your health care provider may refer you to a mental health specialist for further evaluation. TREATMENT The following therapies are usually used to treat GAD:   Medication. Antidepressant medication usually is prescribed for long-term daily control. Antianxiety medicines may be added in severe cases, especially when panic attacks occur.   Talk therapy (psychotherapy). Certain types of talk therapy can be helpful in treating GAD by providing support, education, and guidance. A form of talk therapy called cognitive behavioral therapy can teach you healthy ways to think about and react to daily life events and activities.  Stress managementtechniques. These include yoga, meditation, and exercise and can be very helpful when they are practiced regularly. A mental health specialist can help determine which treatment is best for you. Some people see improvement with one therapy. However, other people require a combination of therapies.   This information is not intended to replace advice given to you by your health care provider. Make sure you discuss any questions you have with your health care provider.   Document Released: 06/15/2012 Document  Revised: 03/11/2014 Document Reviewed: 06/15/2012 Elsevier Interactive Patient Education [employer].

## 2015-11-02 NOTE — Progress Notes (Signed)
Joy Solis, is a 50 y.o. female  VFI:[id number]  [id number]  DOB - 06/15/1965  Chief Complaint  Patient presents with  . Diabetes        Subjective:   Joy Solis is a 50 y.o. female with history of type 2 diabetes mellitus without complications, hypertension, dyslipidemia, spinal stenosis status post surgery, chronic osteoarthritis of both knees, GERD and major depression here today for a follow up visit and medication refills. Patient is Depressed, jobless, anxious, because of his multiple medical conditions and especially chronic pain. Her husband is recently being treated for stage IV prostate cancer, she used to be the only source of income to the family but now lost her job due to chronic pain and as a result no income in the family. This had made her situation worse, finding it difficult to adjust, she is unable to sleep. Her blood sugar and blood pressure has not been controlled effectively. She continues to smoke heavily about 1 pack of cigarettes per day, she says she is not ready to quit. She is tearful during this encounter. Patient has No headache, No chest pain, No abdominal pain - No Nausea, No new weakness tingling or numbness, No Cough - SOB.  No problems updated.  ALLERGIES: Allergies  Allergen Reactions  . Coreg [Carvedilol]     cough  . Lisinopril     cough    PAST MEDICAL HISTORY: Past Medical History:  Diagnosis Date  . Arthritis    degenerative lumbar spine, knees, hands  . Diabetes mellitus   . Dysrhythmia    Holter monitor- 2011  . Environmental allergies   . GERD (gastroesophageal reflux disease)    uses baking soda /w water   . Hyperlipidemia   . Hypertension   . Neuromuscular disorder (HCC)    trigeminal neuralgia , on R side, uses cotton in R ear whether the air gets cold.  Marland Kitchen Spinal stenosis of lumbar region    pt was injected on 06/29/2013  . Tuberculosis    on arrival from [place]- as a teenager, given pills for treatment      MEDICATIONS AT HOME: Prior to Admission medications   Medication Sig Start Date End Date Taking? Authorizing Provider  aspirin 81 MG tablet Take 81 mg by mouth daily.   Yes Historical Provider, MD  atorvastatin (LIPITOR) 10 MG tablet Take 1 tablet (10 mg total) by mouth at bedtime. 11/02/15  Yes Tresa Garter, MD  cyclobenzaprine (FLEXERIL) 10 MG tablet Take 1 tablet (10 mg total) by mouth 3 (three) times daily as needed for muscle spasms. 11/02/15  Yes Tresa Garter, MD  dapagliflozin propanediol (FARXIGA) 10 MG TABS tablet Take 10 mg by mouth daily. 11/02/15  Yes Tresa Garter, MD  diphenhydrAMINE (SOMINEX) 25 MG tablet Take 25 mg by mouth at bedtime as needed for sleep.   Yes Historical Provider, MD  DULoxetine (CYMBALTA) 30 MG capsule Take 1 capsule (30 mg total) by mouth daily. 11/02/15  Yes Tresa Garter, MD  gabapentin (NEURONTIN) 400 MG capsule Take 1 capsule (400 mg total) by mouth 3 (three) times daily. 11/02/15  Yes Jasyn Mey Essie Christine, MD  glipiZIDE (GLUCOTROL) 10 MG tablet Take 1 tablet (10 mg total) by mouth 2 (two) times daily before a meal. 11/02/15  Yes Mathilda Maguire E Doreene Burke, MD  glucose monitoring kit (FREESTYLE) monitoring kit 1 each by Does not apply route 4 (four) times daily - after meals and at bedtime. 1 month Diabetic Testing Supplies for  QAC-QHS accuchecks. Any manufacturer is acceptable. 04/09/13  Yes Reyne Dumas, MD  hydrochlorothiazide (HYDRODIURIL) 25 MG tablet Take 1 tablet (25 mg total) by mouth daily. 11/02/15  Yes Tresa Garter, MD  hydrOXYzine (ATARAX/VISTARIL) 10 MG tablet Take 1 tablet (10 mg total) by mouth 3 (three) times daily as needed. 11/02/15  Yes Tresa Garter, MD  Menthol-Methyl Salicylate (BEN GAY GREASELESS) 10-15 % greaseless cream Apply 1 application topically 3 (three) times daily as needed for pain (to knee).   Yes Historical Provider, MD  metFORMIN (GLUCOPHAGE) 1000 MG tablet Take 1 tablet (1,000 mg total) by mouth 2  (two) times daily with a meal. 11/02/15  Yes Tom Ragsdale E Doreene Burke, MD  acetaminophen-codeine (TYLENOL #3) 300-30 MG tablet Take 1 tablet by mouth every 4 (four) hours as needed for moderate pain. 11/02/15   Tresa Garter, MD  amitriptyline (ELAVIL) 75 MG tablet Take 1 tablet (75 mg total) by mouth at bedtime as needed for sleep or pain. Patient not taking: Reported on 11/02/2015 02/16/15   Tresa Garter, MD  Insulin Syringe-Needle U-100 (RELION INSULIN SYR .3CC/29G) 29G X 1/2" 0.3 ML MISC 1 Syringe by Does not apply route as directed. Patient not taking: Reported on 03/21/2014 08/26/12   Clanford Marisa Hua, MD  traZODone (DESYREL) 50 MG tablet Take 1 tablet (50 mg total) by mouth at bedtime as needed for sleep. 11/02/15   Tresa Garter, MD     Objective:   Vitals:   11/02/15 0929  BP: 116/74  Pulse: 83  Resp: 18  Temp: 98.6 F (37 C)  TempSrc: Oral  SpO2: 99%  Weight: 193 lb 12.8 oz (87.9 kg)  Height: '4\' 11"'$  (1.499 m)    Exam General appearance : Awake, alert, not in any distress. Speech Clear. Not toxic looking HEENT: Atraumatic and Normocephalic, pupils equally reactive to light and accomodation Neck: Supple, no JVD. No cervical lymphadenopathy.  Chest: Good air entry bilaterally, no added sounds  CVS: S1 S2 regular, no murmurs.  Abdomen: Bowel sounds present, Non tender and not distended with no gaurding, rigidity or rebound. Extremities: B/L Lower Ext shows no edema, both legs are warm to touch Neurology: Awake alert, and oriented X 3, CN II-XII intact, Non focal Skin: No Rash  Data Review Lab Results  Component Value Date   HGBA1C 8.40 02/16/2015   HGBA1C 8.10 09/08/2014   HGBA1C 7.5 (H) 02/03/2014     Assessment & Plan   1. Type 2 diabetes mellitus without complication, without long-term current use of insulin (HCC)  - POCT A1C - Glucose (CBG) - Microalbumin/Creatinine Ratio, Urine Refill - metFORMIN (GLUCOPHAGE) 1000 MG tablet; Take 1 tablet  (1,000 mg total) by mouth 2 (two) times daily with a meal.  Dispense: 180 tablet; Refill: 3 - glipiZIDE (GLUCOTROL) 10 MG tablet; Take 1 tablet (10 mg total) by mouth 2 (two) times daily before a meal.  Dispense: 180 tablet; Refill: 3 - gabapentin (NEURONTIN) 400 MG capsule; Take 1 capsule (400 mg total) by mouth 3 (three) times daily.  Dispense: 270 capsule; Refill: 3 - dapagliflozin propanediol (FARXIGA) 10 MG TABS tablet; Take 10 mg by mouth daily.  Dispense: 30 tablet; Refill: 3  Aim for 30 minutes of exercise most days. Rethink what you drink. Water is great! Aim for 2-3 Carb Choices per meal (30-45 grams) +/- 1 either way  Aim for 0-15 Carbs per snack if hungry  Include protein in moderation with your meals and snacks  Consider reading food  labels for Total Carbohydrate and Fat Grams of foods  Consider checking BG at alternate times per day  Continue taking medication as directed Be mindful about how much sugar you are adding to beverages and other foods. Fruit Punch - find one with no sugar  Measure and decrease portions of carbohydrate foods  Make your plate and don't go back for seconds  2. Chronic low back pain Refill - gabapentin (NEURONTIN) 400 MG capsule; Take 1 capsule (400 mg total) by mouth 3 (three) times daily.  Dispense: 270 capsule; Refill: 3 - cyclobenzaprine (FLEXERIL) 10 MG tablet; Take 1 tablet (10 mg total) by mouth 3 (three) times daily as needed for muscle spasms.  Dispense: 60 tablet; Refill: 0 - acetaminophen-codeine (TYLENOL #3) 300-30 MG tablet; Take 1 tablet by mouth every 4 (four) hours as needed for moderate pain.  Dispense: 90 tablet; Refill: 0  3. Essential hypertension Refill - hydrochlorothiazide (HYDRODIURIL) 25 MG tablet; Take 1 tablet (25 mg total) by mouth daily.  Dispense: 90 tablet; Refill: 3  We have discussed target BP range and blood pressure goal. I have advised patient to check BP regularly and to call us back or report to clinic if the  numbers are consistently higher than 140/90. We discussed the importance of compliance with medical therapy and DASH diet recommended, consequences of uncontrolled hypertension discussed.  - continue current BP medications  4. Dyslipidemia  - atorvastatin (LIPITOR) 10 MG tablet; Take 1 tablet (10 mg total) by mouth at bedtime.  Dispense: 90 tablet; Refill: 3  To address this please limit saturated fat to no more than 7% of your calories, limit cholesterol to 200 mg/day, increase fiber and exercise as tolerated. If needed we may add another cholesterol lowering medication to your regimen.   5. Adjustment disorder with mixed anxiety and depressed mood Prescribe - hydrOXYzine (ATARAX/VISTARIL) 10 MG tablet; Take 1 tablet (10 mg total) by mouth 3 (three) times daily as needed.  Dispense: 90 tablet; Refill: 3 - DULoxetine (CYMBALTA) 30 MG capsule; Take 1 capsule (30 mg total) by mouth daily.  Dispense: 30 capsule; Refill: 3 - traZODone (DESYREL) 50 MG tablet; Take 1 tablet (50 mg total) by mouth at bedtime as needed for sleep.  Dispense: 30 tablet; Refill: 3  - Ambulatory referral to Psychiatry  6. Encounter for screening mammogram for breast cancer  - MM Digital Screening; Future  7. Colon cancer screening  - Ambulatory referral to Gastroenterology  Patient have been counseled extensively about nutrition and exercise  Return in about 3 months (around 02/01/2016) for Hemoglobin A1C and Follow up, DM, Generalized Anxiety Disorder.  The patient was given clear instructions to go to ER or return to medical center if symptoms don't improve, worsen or new problems develop. The patient verbalized understanding. The patient was told to call to get lab results if they haven't heard anything in the next week.   This note has been created with Surveyor, quantity. Any transcriptional errors are unintentional.    Angelica Chessman, MD, [place], Karilyn Cota,  [place] and [facility] [place], [place]   11/02/2015, 10:03 AM

## 2015-11-03 LAB — MICROALBUMIN / CREATININE URINE RATIO
Creatinine, Urine: 40 mg/dL (ref 20–320)
Microalb Creat Ratio: 38 mcg/mg creat — ABNORMAL HIGH (ref <30)
Microalb, Ur: 1.5 mg/dL

## 2015-11-03 LAB — VITAMIN D 25 HYDROXY (VIT D DEFICIENCY, FRACTURES): Vit D, 25-Hydroxy: 22 ng/mL — ABNORMAL LOW (ref 30–100)

## 2015-11-10 ENCOUNTER — Telehealth: Payer: Self-pay | Admitting: *Deleted

## 2015-11-10 NOTE — Telephone Encounter (Signed)
-----   Message from Tresa Garter, MD sent at 11/06/2015  4:58 PM EDT ----- Please inform patient that her laboratory results are mostly within normal limit except for her vitamin D level that is insufficient. Recommend Over-the-counter vitamin D capsules daily. Will repeat vitamin D level in 6 months to 1 year.

## 2015-11-10 NOTE — Telephone Encounter (Signed)
Patient verified DOB Patient is aware of labs being mostly normal except for vitamin D level being low. Patient is advised to begin taking an OTC Vitamin D supplement daily and having her levels rechecked in 6 months to a year. Patient expressed her understanding and had no further questions at this time. Patient has completed paperwork for mammogram assistance and patient has scheduled colonoscopy in October.

## 2015-11-10 NOTE — Telephone Encounter (Signed)
Medical Assistant left message on patient's home and cell voicemail. Voicemail states to give a call back to [place] with [facility] at [phone].  

## 2015-11-13 MED FILL — traMADol HCL 50 MG TABS: 50 | 15 days supply | Qty: 60 | Fill #0

## 2015-11-14 ENCOUNTER — Encounter (HOSPITAL_COMMUNITY): Payer: Self-pay

## 2015-11-21 ENCOUNTER — Telehealth: Payer: Self-pay | Admitting: Internal Medicine

## 2015-11-21 NOTE — Telephone Encounter (Signed)
Medical Assistant left message on patient's home and cell voicemail. Voicemail states to give a call back to [place] with [facility] at [phone].  !!!Please inform patient of PCP needing additional time to complete paperwork due to nature of the questions asked. Paperwork will be ready for pickup Thursday morning!!!

## 2015-11-21 NOTE — Telephone Encounter (Signed)
Patient called the office to speak with nurse regarding the status of the documents that she gave PCP to fill out during one of her visits (8/31). She needs to pick it up. Please follow up.   Thank you.

## 2015-11-23 MED FILL — FARXIGA 10 MG TABLET: 10 | 30 days supply | Qty: 30 | Fill #2

## 2015-11-27 ENCOUNTER — Telehealth: Payer: Self-pay | Admitting: Internal Medicine

## 2015-11-27 NOTE — Telephone Encounter (Signed)
Pt. Called requesting to know the status of her paperwork.  Checked in the front to see if paperwork was ready and  Could not find it. Please f/u

## 2015-11-28 NOTE — Telephone Encounter (Signed)
Medical Assistant left message on patient's home and cell voicemail. Voicemail states to give a call back to [place] with [facility] at [phone].  !!!Please inform patient of paperwork being completed and placed at the front desk for pickup!!!

## 2015-12-12 ENCOUNTER — Ambulatory Visit (INDEPENDENT_AMBULATORY_CARE_PROVIDER_SITE_OTHER): Payer: Self-pay | Admitting: Psychiatry

## 2015-12-12 DIAGNOSIS — F320 Major depressive disorder, single episode, mild: Secondary | ICD-10-CM
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
## 2015-12-18 ENCOUNTER — Telehealth: Payer: Self-pay | Admitting: *Deleted

## 2015-12-18 ENCOUNTER — Ambulatory Visit: Payer: Self-pay | Admitting: *Deleted

## 2015-12-18 VITALS — Ht 59.0 in | Wt 193.4 lb

## 2015-12-18 DIAGNOSIS — Z1211 Encounter for screening for malignant neoplasm of colon: Secondary | ICD-10-CM

## 2015-12-18 NOTE — Progress Notes (Signed)
Denies allergies to eggs or soy products. Denies complications with sedation or anesthesia. Denies O2 use. Denies use of diet or weight loss medications.  Emmi instructions given for colonoscopy.  Note sent to [facility] for her to call patient when sample prep available for pickup.

## 2015-12-18 NOTE — Telephone Encounter (Signed)
Abbie Sons,  Ms. Bonham is scheduled for colonoscopy  01/01/16. She has the [facility] card and will need a sample prep. She is happy to come and pick it up. She said please leave her a message when a prep becomes available.  Thank you, Olivia Mackie

## 2015-12-18 NOTE — Telephone Encounter (Signed)
She is now on my list.  I will call her when I get the next shipment

## 2015-12-27 ENCOUNTER — Telehealth: Payer: Self-pay

## 2015-12-27 NOTE — Telephone Encounter (Signed)
Left message for patient that I would leave her suprep sample up front to pick up

## 2015-12-28 ENCOUNTER — Ambulatory Visit (HOSPITAL_COMMUNITY): Payer: Self-pay | Admitting: Psychology

## 2016-01-01 ENCOUNTER — Encounter: Payer: Self-pay | Admitting: Internal Medicine

## 2016-01-01 ENCOUNTER — Ambulatory Visit (AMBULATORY_SURGERY_CENTER): Payer: Self-pay | Admitting: Internal Medicine

## 2016-01-01 VITALS — BP 137/77 | HR 83 | Temp 97.3°F | Resp 11 | Ht 59.0 in | Wt 193.0 lb

## 2016-01-01 DIAGNOSIS — Z1211 Encounter for screening for malignant neoplasm of colon: Secondary | ICD-10-CM

## 2016-01-01 DIAGNOSIS — D124 Benign neoplasm of descending colon: Secondary | ICD-10-CM

## 2016-01-01 DIAGNOSIS — D122 Benign neoplasm of ascending colon: Secondary | ICD-10-CM

## 2016-01-01 DIAGNOSIS — D120 Benign neoplasm of cecum: Secondary | ICD-10-CM

## 2016-01-01 DIAGNOSIS — Z1212 Encounter for screening for malignant neoplasm of rectum: Secondary | ICD-10-CM

## 2016-01-01 LAB — GLUCOSE, CAPILLARY
GLUCOSE-CAPILLARY: 171 mg/dL — AB (ref 65–99)
Glucose-Capillary: 214 mg/dL — ABNORMAL HIGH (ref 65–99)

## 2016-01-01 MED ORDER — SODIUM CHLORIDE 0.9 % IV SOLN: 500.0000 mL | INTRAVENOUS | Status: DC

## 2016-01-01 NOTE — Patient Instructions (Signed)
YOU HAD AN ENDOSCOPIC PROCEDURE TODAY AT THE [facility]:   Refer to the procedure report that was given to you for any specific questions about what was found during the examination.  If the procedure report does not answer your questions, please call your gastroenterologist to clarify.  If you requested that your care partner not be given the details of your procedure findings, then the procedure report has been included in a sealed envelope for you to review at your convenience later.  YOU SHOULD EXPECT: Some feelings of bloating in the abdomen. Passage of more gas than usual.  Walking can help get rid of the air that was put into your GI tract during the procedure and reduce the bloating. If you had a lower endoscopy (such as a colonoscopy or flexible sigmoidoscopy) you may notice spotting of blood in your stool or on the toilet paper. If you underwent a bowel prep for your procedure, you may not have a normal bowel movement for a few days.  Please Note:  You might notice some irritation and congestion in your nose or some drainage.  This is from the oxygen used during your procedure.  There is no need for concern and it should clear up in a day or so.  SYMPTOMS TO REPORT IMMEDIATELY:   Following lower endoscopy (colonoscopy or flexible sigmoidoscopy):  Excessive amounts of blood in the stool  Significant tenderness or worsening of abdominal pains  Swelling of the abdomen that is new, acute  Fever of 100F or higher   For urgent or emergent issues, a gastroenterologist can be reached at any hour by calling [phone].   DIET:  We do recommend a small meal at first, but then you may proceed to your regular diet.  Drink plenty of fluids but you should avoid alcoholic beverages for 24 hours.  ACTIVITY:  You should plan to take it easy for the rest of today and you should NOT DRIVE or use heavy machinery until tomorrow (because of the sedation medicines used during the test).     FOLLOW UP: Our staff will call the number listed on your records the next business day following your procedure to check on you and address any questions or concerns that you may have regarding the information given to you following your procedure. If we do not reach you, we will leave a message.  However, if you are feeling well and you are not experiencing any problems, there is no need to return our call.  We will assume that you have returned to your regular daily activities without incident.  If any biopsies were taken you will be contacted by phone or by letter within the next 1-3 weeks.  Please call us at [phone] if you have not heard about the biopsies in 3 weeks.    SIGNATURES/CONFIDENTIALITY: You and/or your care partner have signed paperwork which will be entered into your electronic medical record.  These signatures attest to the fact that that the information above on your After Visit Summary has been reviewed and is understood.  Full responsibility of the confidentiality of this discharge information lies with you and/or your care-partner.   Resume medications. Information given on polyps,diverticulosis and hemorrhoids. 

## 2016-01-01 NOTE — Progress Notes (Signed)
Report given to PACU RN, vss 

## 2016-01-01 NOTE — Op Note (Signed)
[facility] Patient Name: Joy Solis Procedure Date: 01/01/2016 8:59 AM MRN: [id number] Endoscopist: Jerene Bears , MD Age: 50 Referring MD:  Date of Birth: 03-Jan-1966 Gender: Female Account #: [id number] Procedure:                Colonoscopy Indications:              Screening for colorectal malignant neoplasm, This                            is the patient's first colonoscopy Medicines:                Monitored Anesthesia Care Procedure:                Pre-Anesthesia Assessment:                           - Prior to the procedure, a History and Physical                            was performed, and patient medications and                            allergies were reviewed. The patient's tolerance of                            previous anesthesia was also reviewed. The risks                            and benefits of the procedure and the sedation                            options and risks were discussed with the patient.                            All questions were answered, and informed consent                            was obtained. Prior Anticoagulants: The patient has                            taken no previous anticoagulant or antiplatelet                            agents. ASA Grade Assessment: III - A patient with                            severe systemic disease. After reviewing the risks                            and benefits, the patient was deemed in                            satisfactory condition to undergo the procedure.  After obtaining informed consent, the colonoscope                            was passed under direct vision. Throughout the                            procedure, the patient's blood pressure, pulse, and                            oxygen saturations were monitored continuously. The                            [id number]) was introduced through the anus                            and advanced to the  the cecum, identified by                            appendiceal orifice and ileocecal valve. The                            colonoscopy was performed without difficulty. The                            patient tolerated the procedure well. The quality                            of the bowel preparation was good. The ileocecal                            valve, appendiceal orifice, and rectum were                            photographed. Scope In: 9:03:17 AM Scope Out: 9:21:28 AM Scope Withdrawal Time: 0 hours 14 minutes 26 seconds  Total Procedure Duration: 0 hours 18 minutes 11 seconds  Findings:                 The digital rectal exam was normal.                           A 5 mm polyp was found in the cecum. The polyp was                            sessile. The polyp was removed with a cold snare.                            Resection and retrieval were complete.                           A 6 mm polyp was found in the descending colon. The                            polyp was sessile. The polyp was  removed with a                            cold snare. Resection and retrieval were complete.                           A few small-mouthed diverticula were found in the                            sigmoid colon and descending colon.                           Internal hemorrhoids with hypertrophied anal                            papillae were found during retroflexion. The                            hemorrhoids were medium-sized. Complications:            No immediate complications. Estimated Blood Loss:     Estimated blood loss: none. Impression:               - One 5 mm polyp in the cecum, removed with a cold                            snare. Resected and retrieved.                           - One 6 mm polyp in the descending colon, removed                            with a cold snare. Resected and retrieved.                           - Mild diverticulosis in the sigmoid colon and in                             the descending colon.                           - Internal hemorrhoids. Recommendation:           - Patient has a contact number available for                            emergencies. The signs and symptoms of potential                            delayed complications were discussed with the                            patient. Return to normal activities tomorrow.                            Written discharge instructions were provided to the  patient.                           - Resume previous diet.                           - Continue present medications.                           - Await pathology results.                           - Repeat colonoscopy is recommended for                            surveillance. The colonoscopy date will be                            determined after pathology results from today's                            exam become available for review. Jerene Bears, MD 01/01/2016 9:24:21 AM This report has been signed electronically.

## 2016-01-02 ENCOUNTER — Telehealth: Payer: Self-pay

## 2016-01-02 ENCOUNTER — Telehealth: Payer: Self-pay | Admitting: *Deleted

## 2016-01-02 NOTE — Telephone Encounter (Signed)
  Follow up Call-  Call back number 01/01/2016  Post procedure Call Back phone  # [phone] house  or husbands cell [phone]  Permission to leave phone message Yes  Some recent data might be hidden    No answer

## 2016-01-02 NOTE — Telephone Encounter (Signed)
  Follow up Call-  Call back number 01/01/2016  Post procedure Call Back phone  # [phone] house  or husbands cell [phone]  Permission to leave phone message Yes  Some recent data might be hidden    Patient was called for follow up after her procedure on 01/01/2016. No answer at the number given for follow up phone call. A message was left on the answering machine.

## 2016-01-02 NOTE — Telephone Encounter (Signed)
  Follow up Call-  Call back number 01/01/2016  Post procedure Call Back phone  # [phone] house  or husbands cell [phone]  Permission to leave phone message Yes  Some recent data might be hidden    Patient was called for follow up after her procedure on 1030/2017. No answer at the number given for follow up phone call. I was not able to leave a message due to mail box full.

## 2016-01-04 ENCOUNTER — Encounter: Payer: Self-pay | Admitting: Internal Medicine

## 2016-01-09 MED FILL — traMADol HCL 50 MG TABS: 50 | 15 days supply | Qty: 60 | Fill #0

## 2016-01-09 MED FILL — FARXIGA 10 MG TABLET: 10 | 30 days supply | Qty: 30 | Fill #3

## 2016-01-09 MED FILL — metFORMIN HCL 1000 MG TABS: 1000 | 30 days supply | Qty: 60 | Fill #1

## 2016-01-09 MED FILL — glipiZIDE 10 MG TABS: 10 | 30 days supply | Qty: 60 | Fill #1

## 2016-01-18 ENCOUNTER — Encounter (HOSPITAL_COMMUNITY): Payer: Self-pay | Admitting: Emergency Medicine

## 2016-01-18 ENCOUNTER — Ambulatory Visit (HOSPITAL_COMMUNITY)
Admission: EM | Admit: 2016-01-18 | Discharge: 2016-01-18 | Disposition: A | Discharge status: Home / Self Care | Payer: Self-pay | Attending: Emergency Medicine | Admitting: Emergency Medicine

## 2016-01-18 DIAGNOSIS — T23261A Burn of second degree of back of right hand, initial encounter: Secondary | ICD-10-CM

## 2016-01-18 DIAGNOSIS — Z23 Encounter for immunization: Secondary | ICD-10-CM

## 2016-01-18 MED ORDER — TETANUS-DIPHTH-ACELL PERTUSSIS 5-2.5-18.5 LF-MCG/0.5 IM SUSP
0.5000 mL | Freq: Once | INTRAMUSCULAR | Status: AC
  Administered 2016-01-18: 0.5 mL via INTRAMUSCULAR

## 2016-01-18 MED ORDER — TETANUS-DIPHTH-ACELL PERTUSSIS 5-2.5-18.5 LF-MCG/0.5 IM SUSP
INTRAMUSCULAR | Status: AC
Start: 2016-01-18 — End: 2016-01-18
  Filled 2016-01-18: qty 0.5

## 2016-01-18 MED ORDER — HYDROCODONE-ACETAMINOPHEN 5-325 MG PO TABS: 2.0000 | ORAL_TABLET | ORAL | 0 refills | Status: DC | PRN

## 2016-01-18 MED FILL — HYDROCODON-APAP 5-325: 5-325 | 2 days supply | Qty: 16 | Fill #0
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
## 2016-01-20 ENCOUNTER — Ambulatory Visit (HOSPITAL_COMMUNITY)
Admission: EM | Admit: 2016-01-20 | Discharge: 2016-01-20 | Disposition: A | Discharge status: Home / Self Care | Payer: Self-pay | Attending: Radiology | Admitting: Radiology

## 2016-01-20 ENCOUNTER — Encounter (HOSPITAL_COMMUNITY): Payer: Self-pay | Admitting: Emergency Medicine

## 2016-01-20 DIAGNOSIS — T300 Burn of unspecified body region, unspecified degree: Secondary | ICD-10-CM

## 2016-01-20 MED ORDER — SILVER NITRATE-POT NITRATE 75-25 % EX MISC
CUTANEOUS | Status: AC
Start: 2016-01-20 — End: 2016-01-20
  Filled 2016-01-20: qty 1

## 2016-01-20 MED ORDER — BACITRACIN ZINC 500 UNIT/GM EX OINT
TOPICAL_OINTMENT | CUTANEOUS | Status: AC
  Filled 2016-01-20: qty 2.7

## 2016-01-20 MED ORDER — BACITRACIN ZINC 500 UNIT/GM EX OINT
TOPICAL_OINTMENT | Freq: Two times a day (BID) | CUTANEOUS | Status: DC
  Administered 2016-01-20 (×2): via TOPICAL

## 2016-01-20 MED ORDER — TRAMADOL HCL 50 MG PO TABS: 50.0000 mg | ORAL_TABLET | Freq: Four times a day (QID) | ORAL | 0 refills | Status: DC | PRN

## 2016-01-20 MED ORDER — SILVER NITRATE-POT NITRATE 75-25 % EX MISC: 1.0000 | Freq: Once | CUTANEOUS | Status: DC

## 2016-01-20 MED ORDER — SILVER NITRATE-POT NITRATE 75-25 % EX MISC
CUTANEOUS | Status: AC
  Filled 2016-01-20: qty 1

## 2016-01-20 NOTE — Discharge Instructions (Signed)
Continue to keep affected area bandaged with clean dry dressing
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
## 2016-02-07 ENCOUNTER — Encounter (HOSPITAL_COMMUNITY): Payer: Self-pay | Admitting: Psychology

## 2016-02-07 ENCOUNTER — Ambulatory Visit (INDEPENDENT_AMBULATORY_CARE_PROVIDER_SITE_OTHER): Payer: Self-pay | Admitting: Psychology

## 2016-02-07 DIAGNOSIS — F320 Major depressive disorder, single episode, mild: Secondary | ICD-10-CM

## 2016-02-07 DIAGNOSIS — F411 Generalized anxiety disorder: Secondary | ICD-10-CM

## 2016-02-07 MED FILL — traMADol HCL 50 MG TABS: 50 | 15 days supply | Qty: 60 | Fill #0
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
## 2016-02-19 MED FILL — FARXIGA 10 MG TABLET: 10 | 30 days supply | Qty: 30 | Fill #0

## 2016-02-22 ENCOUNTER — Ambulatory Visit: Payer: Self-pay | Attending: Internal Medicine | Admitting: Internal Medicine

## 2016-02-22 ENCOUNTER — Encounter: Payer: Self-pay | Admitting: Internal Medicine

## 2016-02-22 VITALS — BP 124/80 | HR 18 | Temp 98.6°F | Resp 18 | Ht 60.0 in | Wt 184.2 lb

## 2016-02-22 DIAGNOSIS — G8929 Other chronic pain: Secondary | ICD-10-CM | POA: Insufficient documentation

## 2016-02-22 DIAGNOSIS — E785 Hyperlipidemia, unspecified: Secondary | ICD-10-CM | POA: Insufficient documentation

## 2016-02-22 DIAGNOSIS — Z7982 Long term (current) use of aspirin: Secondary | ICD-10-CM | POA: Insufficient documentation

## 2016-02-22 DIAGNOSIS — M170 Bilateral primary osteoarthritis of knee: Secondary | ICD-10-CM | POA: Insufficient documentation

## 2016-02-22 DIAGNOSIS — E119 Type 2 diabetes mellitus without complications: Secondary | ICD-10-CM | POA: Insufficient documentation

## 2016-02-22 DIAGNOSIS — Z7984 Long term (current) use of oral hypoglycemic drugs: Secondary | ICD-10-CM | POA: Insufficient documentation

## 2016-02-22 DIAGNOSIS — I10 Essential (primary) hypertension: Secondary | ICD-10-CM | POA: Insufficient documentation

## 2016-02-22 DIAGNOSIS — F4323 Adjustment disorder with mixed anxiety and depressed mood: Secondary | ICD-10-CM | POA: Insufficient documentation

## 2016-02-22 DIAGNOSIS — M545 Low back pain, unspecified: Secondary | ICD-10-CM

## 2016-02-22 DIAGNOSIS — M48061 Spinal stenosis, lumbar region without neurogenic claudication: Secondary | ICD-10-CM | POA: Insufficient documentation

## 2016-02-22 DIAGNOSIS — K219 Gastro-esophageal reflux disease without esophagitis: Secondary | ICD-10-CM | POA: Insufficient documentation

## 2016-02-22 DIAGNOSIS — Z9889 Other specified postprocedural states: Secondary | ICD-10-CM | POA: Insufficient documentation

## 2016-02-22 LAB — POCT URINALYSIS DIPSTICK
BILIRUBIN UA: NEGATIVE
Blood, UA: NEGATIVE
Glucose, UA: 100
KETONES UA: NEGATIVE
Leukocytes, UA: NEGATIVE
Nitrite, UA: NEGATIVE
PH UA: 6.5
SPEC GRAV UA: 1.02
Urobilinogen, UA: 0.2

## 2016-02-22 LAB — POCT GLYCOSYLATED HEMOGLOBIN (HGB A1C): HEMOGLOBIN A1C: 7.7

## 2016-02-22 LAB — GLUCOSE, POCT (MANUAL RESULT ENTRY): POC Glucose: 152 mg/dl — AB (ref 70–99)

## 2016-02-22 MED ORDER — DULOXETINE HCL 30 MG PO CPEP: 30.0000 mg | ORAL_CAPSULE | Freq: Every day | ORAL | 3 refills | Status: DC

## 2016-02-22 MED ORDER — ACETAMINOPHEN-CODEINE #3 300-30 MG PO TABS: 1.0000 | ORAL_TABLET | ORAL | 0 refills | Status: DC | PRN

## 2016-02-22 MED ORDER — HYDROCHLOROTHIAZIDE 25 MG PO TABS: 25.0000 mg | ORAL_TABLET | Freq: Every day | ORAL | 3 refills | Status: DC

## 2016-02-22 MED ORDER — AMITRIPTYLINE HCL 75 MG PO TABS: 75.0000 mg | ORAL_TABLET | Freq: Every evening | ORAL | 3 refills | Status: DC | PRN

## 2016-02-22 MED ORDER — GABAPENTIN 400 MG PO CAPS: 400.0000 mg | ORAL_CAPSULE | Freq: Three times a day (TID) | ORAL | 3 refills | Status: DC

## 2016-02-22 MED ORDER — METFORMIN HCL 1000 MG PO TABS: 1000.0000 mg | ORAL_TABLET | Freq: Two times a day (BID) | ORAL | 3 refills | Status: DC

## 2016-02-22 MED ORDER — GLIPIZIDE 10 MG PO TABS: 10.0000 mg | ORAL_TABLET | Freq: Two times a day (BID) | ORAL | 3 refills | Status: DC

## 2016-02-22 MED ORDER — ATORVASTATIN CALCIUM 10 MG PO TABS: 10.0000 mg | ORAL_TABLET | Freq: Every day | ORAL | 3 refills | Status: DC

## 2016-02-22 MED ORDER — TRAZODONE HCL 50 MG PO TABS: 50.0000 mg | ORAL_TABLET | Freq: Every evening | ORAL | 3 refills | Status: DC | PRN

## 2016-02-22 MED ORDER — DAPAGLIFLOZIN PROPANEDIOL 10 MG PO TABS: 10.0000 mg | ORAL_TABLET | Freq: Every day | ORAL | 3 refills | Status: DC

## 2016-02-22 MED ORDER — CYCLOBENZAPRINE HCL 10 MG PO TABS: 10.0000 mg | ORAL_TABLET | Freq: Three times a day (TID) | ORAL | 3 refills | Status: DC | PRN

## 2016-02-22 NOTE — Patient Instructions (Signed)
Diabetes and Foot Care Diabetes may cause you to have problems because of poor blood supply (circulation) to your feet and legs. This may cause the skin on your feet to become thinner, break easier, and heal more slowly. Your skin may become dry, and the skin may peel and crack. You may also have nerve damage in your legs and feet causing decreased feeling in them. You may not notice minor injuries to your feet that could lead to infections or more serious problems. Taking care of your feet is one of the most important things you can do for yourself. Follow these instructions at home:  Wear shoes at all times, even in the house. Do not go barefoot. Bare feet are easily injured.  Check your feet daily for blisters, cuts, and redness. If you cannot see the bottom of your feet, use a mirror or ask someone for help.  Wash your feet with warm water (do not use hot water) and mild soap. Then pat your feet and the areas between your toes until they are completely dry. Do not soak your feet as this can dry your skin.  Apply a moisturizing lotion or petroleum jelly (that does not contain alcohol and is unscented) to the skin on your feet and to dry, brittle toenails. Do not apply lotion between your toes.  Trim your toenails straight across. Do not dig under them or around the cuticle. File the edges of your nails with an emery board or nail file.  Do not cut corns or calluses or try to remove them with medicine.  Wear clean socks or stockings every day. Make sure they are not too tight. Do not wear knee-high stockings since they may decrease blood flow to your legs.  Wear shoes that fit properly and have enough cushioning. To break in new shoes, wear them for just a few hours a day. This prevents you from injuring your feet. Always look in your shoes before you put them on to be sure there are no objects inside.  Do not cross your legs. This may decrease the blood flow to your feet.  If you find a  minor scrape, cut, or break in the skin on your feet, keep it and the skin around it clean and dry. These areas may be cleansed with mild soap and water. Do not cleanse the area with peroxide, alcohol, or iodine.  When you remove an adhesive bandage, be sure not to damage the skin around it.  If you have a wound, look at it several times a day to make sure it is healing.  Do not use heating pads or hot water bottles. They may burn your skin. If you have lost feeling in your feet or legs, you may not know it is happening until it is too late.  Make sure your health care provider performs a complete foot exam at least annually or more often if you have foot problems. Report any cuts, sores, or bruises to your health care provider immediately. Contact a health care provider if:  You have an injury that is not healing.  You have cuts or breaks in the skin.  You have an ingrown nail.  You notice redness on your legs or feet.  You feel burning or tingling in your legs or feet.  You have pain or cramps in your legs and feet.  Your legs or feet are numb.  Your feet always feel cold. Get help right away if:  There is increasing   redness, swelling, or pain in or around a wound.  There is a red line that goes up your leg.  Pus is coming from a wound.  You develop a fever or as directed by your health care provider.  You notice a bad smell coming from an ulcer or wound. This information is not intended to replace advice given to you by your health care provider. Make sure you discuss any questions you have with your health care provider. Document Released: 02/16/2000 Document Revised: 07/27/2015 Document Reviewed: 07/28/2012 Elsevier Interactive Patient Education  2017 [place]. Blood Glucose Monitoring, Adult Monitoring your blood sugar (glucose) helps you manage your diabetes. It also helps you and your health care provider determine how well your diabetes management plan is  working. Blood glucose monitoring involves checking your blood glucose as often as directed, and keeping a record (log) of your results over time. Why should I monitor my blood glucose? Checking your blood glucose regularly can:  Help you understand how food, exercise, illnesses, and medicines affect your blood glucose.  Let you know what your blood glucose is at any time. You can quickly tell if you are having low blood glucose (hypoglycemia) or high blood glucose (hyperglycemia).  Help you and your health care provider adjust your medicines as needed. When should I check my blood glucose? Follow instructions from your health care provider about how often to check your blood glucose. This may depend on:  The type of diabetes you have.  How well-controlled your diabetes is.  Medicines you are taking. If you have type 1 diabetes:  Check your blood glucose at least 2 times a day.  Also check your blood glucose:  Before every insulin injection.  Before and after exercise.  Between meals.  2 hours after a meal.  Occasionally between 2:00 a.m. and 3:00 a.m., as directed.  Before potentially dangerous tasks, like driving or using heavy machinery.  At bedtime.  You may need to check your blood glucose more often, up to 6-10 times a day:  If you use an insulin pump.  If you need multiple daily injections (MDI).  If your diabetes is not well-controlled.  If you are ill.  If you have a history of severe hypoglycemia.  If you have a history of not knowing when your blood glucose is getting low (hypoglycemia unawareness). If you have type 2 diabetes:  If you take insulin or other diabetes medicines, check your blood glucose at least 2 times a day.  If you are on intensive insulin therapy, check your blood glucose at least 4 times a day. Occasionally, you may also need to check between 2:00 a.m. and 3:00 a.m., as directed.  Also check your blood glucose:  Before and after  exercise.  Before potentially dangerous tasks, like driving or using heavy machinery.  You may need to check your blood glucose more often if:  Your medicine is being adjusted.  Your diabetes is not well-controlled.  You are ill. What is a blood glucose log?  A blood glucose log is a record of your blood glucose readings. It helps you and your health care provider:  Look for patterns in your blood glucose over time.  Adjust your diabetes management plan as needed.  Every time you check your blood glucose, write down your result and notes about things that may be affecting your blood glucose, such as your diet and exercise for the day.  Most glucose meters store a record of glucose readings in  the meter. Some meters allow you to download your records to a computer. How do I check my blood glucose? Follow these steps to get accurate readings of your blood glucose: Supplies needed   Blood glucose meter.  Test strips for your meter. Each meter has its own strips. You must use the strips that come with your meter.  A needle to prick your finger (lancet). Do not use lancets more than once.  A device that holds the lancet (lancing device).  A journal or log book to write down your results. Procedure  Wash your hands with soap and water.  Prick the side of your finger (not the tip) with the lancet. Use a different finger each time.  Gently rub the finger until a small drop of blood appears.  Follow instructions that come with your meter for inserting the test strip, applying blood to the strip, and using your blood glucose meter.  Write down your result and any notes. Alternative testing sites  Some meters allow you to use areas of your body other than your finger (alternative sites) to test your blood.  If you think you may have hypoglycemia, or if you have hypoglycemia unawareness, do not use alternative sites. Use your finger instead.  Alternative sites may not be as  accurate as the fingers, because blood flow is slower in these areas. This means that the result you get may be delayed, and it may be different from the result that you would get from your finger.  The most common alternative sites are:  Forearm.  Thigh.  Palm of the hand. Additional tips  Always keep your supplies with you.  If you have questions or need help, all blood glucose meters have a 24-hour "hotline" number that you can call. You may also contact your health care provider.  After you use a few boxes of test strips, adjust (calibrate) your blood glucose meter by following instructions that came with your meter. This information is not intended to replace advice given to you by your health care provider. Make sure you discuss any questions you have with your health care provider. Document Released: 02/21/2003 Document Revised: 09/08/2015 Document Reviewed: 07/31/2015 Elsevier Interactive Patient Education  2017 [employer]  

## 2016-02-22 NOTE — Progress Notes (Signed)
Patient is here for follow up Diabetes,  Patient stated that she has pain in her lower back right side. She has a pain scale of 7.

## 2016-02-22 NOTE — Progress Notes (Signed)
a1c

## 2016-02-22 NOTE — Progress Notes (Signed)
Joy Solis, is a 50 y.o. female  BSJ:[id number]  TML:[id number]  DOB - 1965/07/31  Chief Complaint  Patient presents with  . Diabetes       Subjective:   Joy Solis is a 50 y.o. female with history of type 2 diabetes mellitus without complications, hypertension, dyslipidemia, spinal stenosis status post surgery, chronic osteoarthritis of both knees, GERD and major depression here today for a follow up visit and medication refill. Patient is adherent with medications and diet, BS is better controlled. BP is controlled. She has no new complaint today except for ongoing low back pain. Her depression is better, she denies any suicidal ideation or thoughts. Patient has No headache, No chest pain, No abdominal pain - No Nausea, No new weakness tingling or numbness, No Cough - SOB.  Problem  Chronic Midline Low Back Pain Without Sciatica    ALLERGIES: Allergies  Allergen Reactions  . Coreg [Carvedilol]     cough  . Lisinopril     cough    PAST MEDICAL HISTORY: Past Medical History:  Diagnosis Date  . Arthritis    degenerative lumbar spine, knees, hands  . Diabetes mellitus   . Diabetes mellitus, type II ([facility])   . Dysrhythmia    Holter monitor- 2011  . Environmental allergies   . GERD (gastroesophageal reflux disease)    uses baking soda /w water   . Hyperlipidemia   . Hypertension   . Neuromuscular disorder (HCC)    trigeminal neuralgia , on R side, uses cotton in R ear whether the air gets cold.  Marland Kitchen Spinal stenosis of lumbar region    pt was injected on 06/29/2013  . Tuberculosis    on arrival from [place]- as a teenager, given pills for treatment     MEDICATIONS AT HOME: Prior to Admission medications   Medication Sig Start Date End Date Taking? Authorizing Provider  acetaminophen-codeine (TYLENOL #3) 300-30 MG tablet Take 1 tablet by mouth every 4 (four) hours as needed for moderate pain. 02/22/16  Yes Tresa Garter, MD  amitriptyline (ELAVIL) 75 MG tablet  Take 1 tablet (75 mg total) by mouth at bedtime as needed. 02/22/16  Yes Tresa Garter, MD  aspirin 81 MG tablet Take 81 mg by mouth daily.   Yes Historical Provider, MD  atorvastatin (LIPITOR) 10 MG tablet Take 1 tablet (10 mg total) by mouth at bedtime. 02/22/16  Yes Tresa Garter, MD  cyclobenzaprine (FLEXERIL) 10 MG tablet Take 1 tablet (10 mg total) by mouth 3 (three) times daily as needed for muscle spasms. 02/22/16  Yes Tresa Garter, MD  dapagliflozin propanediol (FARXIGA) 10 MG TABS tablet Take 10 mg by mouth daily. 02/22/16  Yes Tresa Garter, MD  diphenhydrAMINE (SOMINEX) 25 MG tablet Take 25 mg by mouth at bedtime as needed for sleep.   Yes Historical Provider, MD  DULoxetine (CYMBALTA) 30 MG capsule Take 1 capsule (30 mg total) by mouth daily. 02/22/16  Yes Tresa Garter, MD  gabapentin (NEURONTIN) 400 MG capsule Take 1 capsule (400 mg total) by mouth 3 (three) times daily. 02/22/16  Yes Arora Coakley Essie Christine, MD  glipiZIDE (GLUCOTROL) 10 MG tablet Take 1 tablet (10 mg total) by mouth 2 (two) times daily before a meal. 02/22/16  Yes Rileyann Florance E Doreene Burke, MD  glucose monitoring kit (FREESTYLE) monitoring kit 1 each by Does not apply route 4 (four) times daily - after meals and at bedtime. 1 month Diabetic Testing Supplies for QAC-QHS accuchecks. Any manufacturer  is acceptable. 04/09/13  Yes Richarda Overlie, MD  hydrochlorothiazide (HYDRODIURIL) 25 MG tablet Take 1 tablet (25 mg total) by mouth daily. 02/22/16  Yes Quentin Angst, MD  Menthol-Methyl Salicylate (BEN GAY GREASELESS) 10-15 % greaseless cream Apply 1 application topically 3 (three) times daily as needed for pain (to knee).   Yes Historical Provider, MD  metFORMIN (GLUCOPHAGE) 1000 MG tablet Take 1 tablet (1,000 mg total) by mouth 2 (two) times daily with a meal. 02/22/16  Yes Quentin Angst, MD  traZODone (DESYREL) 50 MG tablet Take 1 tablet (50 mg total) by mouth at bedtime as needed for sleep.  02/22/16  Yes Quentin Angst, MD    Objective:   Vitals:   02/22/16 1456  BP: 124/80  Pulse: (!) 18  Resp: 18  Temp: 98.6 F (37 C)  TempSrc: Oral  SpO2: 98%  Weight: 184 lb 3.2 oz (83.6 kg)  Height: 5' (1.524 m)   Exam General appearance : Awake, alert, not in any distress. Speech Clear. Not toxic looking HEENT: Atraumatic and Normocephalic, pupils equally reactive to light and accomodation Neck: Supple, no JVD. No cervical lymphadenopathy.  Chest: Good air entry bilaterally, no added sounds  CVS: S1 S2 regular, no murmurs.  Abdomen: Bowel sounds present, Non tender and not distended with no gaurding, rigidity or rebound. Extremities: B/L Lower Ext shows no edema, both legs are warm to touch Neurology: Awake alert, and oriented X 3, CN II-XII intact, Non focal Skin: No Rash  Data Review Lab Results  Component Value Date   HGBA1C 7.7 02/22/2016   HGBA1C 8.3 11/02/2015   HGBA1C 8.40 02/16/2015    Assessment & Plan   1. Type 2 diabetes mellitus without complication, without long-term current use of insulin (HCC)  - Glucose (CBG) - HgB A1c - metFORMIN (GLUCOPHAGE) 1000 MG tablet; Take 1 tablet (1,000 mg total) by mouth 2 (two) times daily with a meal.  Dispense: 180 tablet; Refill: 3 - glipiZIDE (GLUCOTROL) 10 MG tablet; Take 1 tablet (10 mg total) by mouth 2 (two) times daily before a meal.  Dispense: 180 tablet; Refill: 3 - gabapentin (NEURONTIN) 400 MG capsule; Take 1 capsule (400 mg total) by mouth 3 (three) times daily.  Dispense: 270 capsule; Refill: 3 - dapagliflozin propanediol (FARXIGA) 10 MG TABS tablet; Take 10 mg by mouth daily.  Dispense: 30 tablet; Refill: 3  2. Chronic bilateral low back pain without sciatica  - Continue pain medications as prescribed  3. Essential hypertension  - hydrochlorothiazide (HYDRODIURIL) 25 MG tablet; Take 1 tablet (25 mg total) by mouth daily.  Dispense: 90 tablet; Refill: 3  4. Dyslipidemia  - atorvastatin  (LIPITOR) 10 MG tablet; Take 1 tablet (10 mg total) by mouth at bedtime.  Dispense: 90 tablet; Refill: 3  5. Chronic midline low back pain without sciatica  - gabapentin (NEURONTIN) 400 MG capsule; Take 1 capsule (400 mg total) by mouth 3 (three) times daily.  Dispense: 270 capsule; Refill: 3 - cyclobenzaprine (FLEXERIL) 10 MG tablet; Take 1 tablet (10 mg total) by mouth 3 (three) times daily as needed for muscle spasms.  Dispense: 60 tablet; Refill: 3 - amitriptyline (ELAVIL) 75 MG tablet; Take 1 tablet (75 mg total) by mouth at bedtime as needed.  Dispense: 30 tablet; Refill: 3 - acetaminophen-codeine (TYLENOL #3) 300-30 MG tablet; Take 1 tablet by mouth every 4 (four) hours as needed for moderate pain.  Dispense: 90 tablet; Refill: 0  6. Adjustment disorder with mixed anxiety and depressed  mood  - DULoxetine (CYMBALTA) 30 MG capsule; Take 1 capsule (30 mg total) by mouth daily.  Dispense: 30 capsule; Refill: 3 - traZODone (DESYREL) 50 MG tablet; Take 1 tablet (50 mg total) by mouth at bedtime as needed for sleep.  Dispense: 30 tablet; Refill: 3  Patient have been counseled extensively about nutrition and exercise. Other issues discussed during this visit include: low cholesterol diet, weight control and daily exercise, foot care, annual eye examinations at Ophthalmology, importance of adherence with medications and regular follow-up. We also discussed long term complications of uncontrolled diabetes and hypertension.   Return in about 3 months (around 05/22/2016) for Hemoglobin A1C and Follow up, DM, Follow up HTN, Follow up Pain and comorbidities.  The patient was given clear instructions to go to ER or return to medical center if symptoms don't improve, worsen or new problems develop. The patient verbalized understanding. The patient was told to call to get lab results if they haven't heard anything in the next week.   This note has been created with Engineer, agricultural. Any transcriptional errors are unintentional.    Angelica Chessman, MD, [place], Karilyn Cota, [place] and [place], [place]   02/22/2016, 3:53 PM

## 2016-02-28 ENCOUNTER — Ambulatory Visit (INDEPENDENT_AMBULATORY_CARE_PROVIDER_SITE_OTHER): Payer: Self-pay | Admitting: Psychiatry

## 2016-02-28 DIAGNOSIS — Z7982 Long term (current) use of aspirin: Secondary | ICD-10-CM

## 2016-02-28 DIAGNOSIS — F4323 Adjustment disorder with mixed anxiety and depressed mood: Secondary | ICD-10-CM

## 2016-02-28 DIAGNOSIS — Z833 Family history of diabetes mellitus: Secondary | ICD-10-CM

## 2016-02-28 DIAGNOSIS — F17210 Nicotine dependence, cigarettes, uncomplicated: Secondary | ICD-10-CM

## 2016-02-28 DIAGNOSIS — Z8249 Family history of ischemic heart disease and other diseases of the circulatory system: Secondary | ICD-10-CM

## 2016-02-28 DIAGNOSIS — Z79899 Other long term (current) drug therapy: Secondary | ICD-10-CM

## 2016-02-28 DIAGNOSIS — Z811 Family history of alcohol abuse and dependence: Secondary | ICD-10-CM

## 2016-02-28 DIAGNOSIS — Z9889 Other specified postprocedural states: Secondary | ICD-10-CM

## 2016-02-28 DIAGNOSIS — Z8489 Family history of other specified conditions: Secondary | ICD-10-CM

## 2016-02-28 DIAGNOSIS — F324 Major depressive disorder, single episode, in partial remission: Secondary | ICD-10-CM

## 2016-02-28 MED ORDER — DULOXETINE HCL 30 MG PO CPEP: ORAL_CAPSULE | ORAL | 3 refills | Status: DC

## 2016-02-28 MED ORDER — TRAZODONE HCL 100 MG PO TABS: ORAL_TABLET | ORAL | 2 refills | Status: DC

## 2016-02-28 MED ORDER — HYDROXYZINE PAMOATE 25 MG PO CAPS: ORAL_CAPSULE | ORAL | 2 refills | Status: DC

## 2016-02-28 NOTE — Progress Notes (Signed)
Psychiatric Initial Adult Assessment   Patient Identification: Joy Solis MRN:  [id number] Date of Evaluation:  02/28/2016 Referral Source:   Chief Complaint:   Visit Diagnosis:    ICD-9-CM ICD-10-CM   1. Adjustment disorder with mixed anxiety and depressed mood 309.28 F43.23 DULoxetine (CYMBALTA) 30 MG capsule     traZODone (DESYREL) 100 MG tablet     DISCONTINUED: traZODone (DESYREL) 100 MG tablet    History of Present Illness:   This patient is a 50 year old African-American married mother who is being seen and evaluated for depression. The patient's biggest stresses related to her husband who is alcohol dependent. He is intoxicated on a regular basis. The patient has been married to him for 12 years but she's not sure she loves him. She is great difficulties communicating and negotiating. She does trust him. Even her specks him. This man is 52 years old her senior was recently treated with radiation therapy for his prostate cancer. He is retired gentleman. They've no children together. The patient herself has 4 children. For all children. Her oldest is 40 years old and has 10 children. They've recently moved back to [place]. The patient's 2 other daughters have minimal contact with the patient. One of her daughter's name [name] has 4 children and they also are presently in a housing program in [place]. She clearly is very stressed out over her family dysfunction she is experiencing from. The patient also is very stressed out of losing her job after 14 years working at [employer]. Apparently the child made an accusation that she was threatening she lost her job. Presently the patient for the last 2 months is been working at [employer] very part time. The patient is not happy at this job. 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 The patient admits to daily persistent depression. Is been over the last one year and is worsened. She has dysfunctional sleep that she's had for years but she  claims some ways worse. She feels sleepy much of the time. She's lost her appetite and has a weight loss. She describes herself as having no energy. She has difficulty thinking and concentrating. She does not feel worthless and she has normal psychomotor functioning. The patient denies being suicidal at this time. She has made 1 suicide attempt back in 1981 when she was psychiatrically hospitalized. That was the only time she was hospitalized for psychiatric history. Generally the patient does still enjoys some things like computer games music. Much of the things she used to enjoy she no longer does The patient denies the use of alcohol or drugs. However 12 years ago she was using crack cocaine regular cocaine smoking marijuana excessive alcohol. She says she stopped by finding her church. The patient denies any symptoms of mania in the past. She even denies distinct episodes of major depression other than the last year or 2. Patient denies symptoms of generalized anxiety disorder, panic disorder or obsessive-compulsive disorder. The patient has significant medical problems including multiple orthopedic surgeries on her back her knee and her foot. She has arthritis. She also has insulin-dependent diabetes and hypertension. The patient is been psychiatrically hospitalized 1 time, has never seen a psychiatrist is just starting in psychotherapy with our therapist Mayer Masker. The patient did not graduate from high school. She was born in [place] but 50 years of age went to [place] with her mother. Her mother died when the patient was 68 at that point she was raised by grandmother until age  13. The patient says she's been in and out of many different places she's lived. At this time the patient denies being suicidal. She says is here because her primary care doctor wanted her to get psychiatric care.  Associated Signs/Symptoms: Depression Symptoms:  depressed mood, (Hypo) Manic Symptoms:   Anxiety Symptoms:    Psychotic Symptoms:   PTSD Symptoms:   Past Psychiatric History: Psychiatric hospitalization in 1981  Previous Psychotropic Medications: Yes   Substance Abuse History in the last 12 months:  Yes.    Consequences of Substance Abuse: Negative  Past Medical History:  Past Medical History:  Diagnosis Date  . Arthritis    degenerative lumbar spine, knees, hands  . Diabetes mellitus   . Diabetes mellitus, type II ([place])   . Dysrhythmia    Holter monitor- 2011  . Environmental allergies   . GERD (gastroesophageal reflux disease)    uses baking soda /w water   . Hyperlipidemia   . Hypertension   . Neuromuscular disorder (HCC)    trigeminal neuralgia , on R side, uses cotton in R ear whether the air gets cold.  Marland Kitchen Spinal stenosis of lumbar region    pt was injected on 06/29/2013  . Tuberculosis    on arrival from [place]- as a teenager, given pills for treatment     Past Surgical History:  Procedure Laterality Date  . ANKLE SURGERY Right    pin in place- for heel pain   . BACK SURGERY  2015  . FOOT SURGERY Right 2008   arch, stretched   . HAND SURGERY Right    carpal tunnel release   . KNEE SURGERY Left 2010   arthroscopy    Family Psychiatric History:   Family History:  Family History  Problem Relation Age of Onset  . Heart disease Mother   . Diabetes Mother   . Alcohol abuse Mother   . Glaucoma Maternal Grandmother   . Colon cancer Neg Hx     Social History:   Social History   Social History  . Marital status: Married    Spouse name: N/A  . Number of children: N/A  . Years of education: N/A   Social History Main Topics  . Smoking status: Current Every Day Smoker    Packs/day: 1.00  . Smokeless tobacco: Never Used  . Alcohol use No  . Drug use: No  . Sexual activity: Not on file   Other Topics Concern  . Not on file   Social History Narrative  . No narrative on file    Additional Social History:   Allergies:   Allergies  Allergen Reactions  .  Coreg [Carvedilol]     cough  . Lisinopril     cough    Metabolic Disorder Labs: Lab Results  Component Value Date   HGBA1C 7.7 02/22/2016   MPG 169 (H) 02/03/2014   MPG 200 03/01/2009   No results found for: PROLACTIN Lab Results  Component Value Date   CHOL 228 (H) 09/08/2014   TRIG 180 (H) 09/08/2014   HDL 37 (L) 09/08/2014   CHOLHDL 6.2 09/08/2014   VLDL 36 09/08/2014   LDLCALC 155 (H) 09/08/2014   LDLCALC 108 (H) 11/16/2013     Current Medications: Current Outpatient Prescriptions  Medication Sig Dispense Refill  . acetaminophen-codeine (TYLENOL #3) 300-30 MG tablet Take 1 tablet by mouth every 4 (four) hours as needed for moderate pain. 90 tablet 0  . amitriptyline (ELAVIL) 75 MG tablet Take 1 tablet (75  mg total) by mouth at bedtime as needed. 30 tablet 3  . aspirin 81 MG tablet Take 81 mg by mouth daily.    Marland Kitchen atorvastatin (LIPITOR) 10 MG tablet Take 1 tablet (10 mg total) by mouth at bedtime. 90 tablet 3  . cyclobenzaprine (FLEXERIL) 10 MG tablet Take 1 tablet (10 mg total) by mouth 3 (three) times daily as needed for muscle spasms. 60 tablet 3  . dapagliflozin propanediol (FARXIGA) 10 MG TABS tablet Take 10 mg by mouth daily. 30 tablet 3  . diphenhydrAMINE (SOMINEX) 25 MG tablet Take 25 mg by mouth at bedtime as needed for sleep.    . DULoxetine (CYMBALTA) 30 MG capsule 1  qam for 3 days then 2 qam 60 capsule 3  . gabapentin (NEURONTIN) 400 MG capsule Take 1 capsule (400 mg total) by mouth 3 (three) times daily. 270 capsule 3  . glipiZIDE (GLUCOTROL) 10 MG tablet Take 1 tablet (10 mg total) by mouth 2 (two) times daily before a meal. 180 tablet 3  . glucose monitoring kit (FREESTYLE) monitoring kit 1 each by Does not apply route 4 (four) times daily - after meals and at bedtime. 1 month Diabetic Testing Supplies for QAC-QHS accuchecks. Any manufacturer is acceptable. 1 each 1  . hydrochlorothiazide (HYDRODIURIL) 25 MG tablet Take 1 tablet (25 mg total) by mouth daily.  90 tablet 3  . hydrOXYzine (VISTARIL) 25 MG capsule 1 qam 1 @ 5;00 and 1 prn qday 90 capsule 2  . Menthol-Methyl Salicylate (BEN GAY GREASELESS) 10-15 % greaseless cream Apply 1 application topically 3 (three) times daily as needed for pain (to knee).    . metFORMIN (GLUCOPHAGE) 1000 MG tablet Take 1 tablet (1,000 mg total) by mouth 2 (two) times daily with a meal. 180 tablet 3  . traZODone (DESYREL) 100 MG tablet 2  qhs may increase after 1 week to 3  qhs 90 tablet 2   Current Facility-Administered Medications  Medication Dose Route Frequency Provider Last Rate Last Dose  . 0.9 %  sodium chloride infusion  500 mL Intravenous Continuous Jerene Bears, MD        Neurologic: Headache: No Seizure: No Paresthesias:No  Musculoskeletal: Strength & Muscle Tone: abnormal Gait & Station: ataxic Patient leans: Right  Psychiatric Specialty Exam: ROS  There were no vitals taken for this visit.There is no height or weight on file to calculate BMI.  General Appearance: Casual  Eye Contact:  Good  Speech:  Clear and Coherent  Volume:  Normal  Mood:  Negative  Affect:  Blunt  Thought Process:  Goal Directed  Orientation:  NA  Thought Content:  Logical  Suicidal Thoughts:  No  Homicidal Thoughts:  No  Memory:  Negative  Judgement:  Fair  Insight:  Present  Psychomotor Activity:  Decreased  Concentration:  Decreased   Recall:  [place] of Knowledge:Fair  Language: Good  Akathisia:  No  Handed:  Right  AIMS (if indicated):    Assets:  Desire for Improvement  ADL's:  Intact  Cognition: WNL  Sleep:      Treatment Plan Summary:  At this time the patient will start back on Cymbalta but increase the dose to 60 mg. She recently been on 30 mg but had no benefit. Patient was also on 50 mg of trazodone. At this time we shall increase it to 200 mg of trazodone with the ability take an extra 100 mg. Cymbalta is the best choice because she has chronic pain  in her body. We'll also give her  Vistaril 25 mg 1 in the morning one at 5:00 and one extra when necessary when she has acute anxiety. I do not believe she has a panic disorder. Continue in one-to-one therapy. This patient to return to see me in 8 weeks.  Haskel Schroeder, MD 12/27/20174:34 PM

## 2016-03-06 ENCOUNTER — Ambulatory Visit: Payer: Self-pay | Attending: Internal Medicine

## 2016-03-14 ENCOUNTER — Encounter (HOSPITAL_COMMUNITY): Payer: Self-pay | Admitting: Psychology

## 2016-03-14 ENCOUNTER — Ambulatory Visit (HOSPITAL_COMMUNITY): Payer: Self-pay | Admitting: Psychology

## 2016-03-14 NOTE — Progress Notes (Signed)
Joy Solis is a 50 y.o. female patient who didn't show for her appointment.  Letter sent.        Monika Chestang, LPC 

## 2016-03-28 ENCOUNTER — Encounter (HOSPITAL_COMMUNITY): Payer: Self-pay | Admitting: Psychology

## 2016-03-28 ENCOUNTER — Ambulatory Visit (HOSPITAL_COMMUNITY): Payer: Self-pay | Admitting: Psychology

## 2016-03-28 NOTE — Progress Notes (Signed)
Joy Solis is a 50 y.o. female patient who didn't show for her appointment.  Letter sent.        Cassadee Vanzandt, LPC 

## 2016-04-05 MED FILL — FARXIGA 10 MG TABLET: 10 | 30 days supply | Qty: 30 | Fill #1

## 2016-04-05 MED FILL — traMADol HCL 50 MG TABS: 50 | 7 days supply | Qty: 28 | Fill #0

## 2016-04-11 ENCOUNTER — Ambulatory Visit (INDEPENDENT_AMBULATORY_CARE_PROVIDER_SITE_OTHER): Payer: Self-pay | Admitting: Psychology

## 2016-04-11 ENCOUNTER — Encounter (INDEPENDENT_AMBULATORY_CARE_PROVIDER_SITE_OTHER): Payer: Self-pay

## 2016-04-11 DIAGNOSIS — F331 Major depressive disorder, recurrent, moderate: Secondary | ICD-10-CM

## 2016-04-11 DIAGNOSIS — F411 Generalized anxiety disorder: Secondary | ICD-10-CM
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
## 2016-04-16 MED FILL — DULoxetine HCL 30 MG CPEP: 30 | 30 days supply | Qty: 60 | Fill #0

## 2016-04-25 ENCOUNTER — Ambulatory Visit (HOSPITAL_COMMUNITY): Payer: Self-pay | Admitting: Psychology

## 2016-04-25 ENCOUNTER — Ambulatory Visit (HOSPITAL_COMMUNITY): Payer: Self-pay | Admitting: Psychiatry

## 2016-05-01 MED FILL — FARXIGA 10 MG TABLET: 10 | 30 days supply | Qty: 30 | Fill #2

## 2016-05-06 MED FILL — traMADol HCL 50 MG TABS: 50 | 8 days supply | Qty: 32 | Fill #1

## 2016-05-09 ENCOUNTER — Ambulatory Visit (HOSPITAL_COMMUNITY): Payer: Self-pay | Admitting: Psychology

## 2016-05-09 ENCOUNTER — Encounter (HOSPITAL_COMMUNITY): Payer: Self-pay | Admitting: Psychology

## 2016-05-09 NOTE — Progress Notes (Signed)
Joy Solis is a 51 y.o. female patient who didn't show for her appointment.  Letter sent.        Jan Fireman, LPC

## 2016-05-21 MED FILL — traZODone HCL 100 MG TABS: 100 | 30 days supply | Qty: 90 | Fill #0

## 2016-05-21 MED FILL — ACETAMINOPHEN/COD #3 TABLET: 300-30 | 7 days supply | Qty: 42 | Fill #0

## 2016-05-21 MED FILL — HYDROXYZINE PAM 25 MG CAP: 25 | 30 days supply | Qty: 90 | Fill #0

## 2016-05-21 MED FILL — DULoxetine HCL 30 MG CPEP: 30 | 30 days supply | Qty: 60 | Fill #1

## 2016-05-21 MED FILL — glipiZIDE 10 MG TABS: 10 | 30 days supply | Qty: 60 | Fill #0

## 2016-05-21 MED FILL — HYDROCHLOROTHIAZIDE 25 MG T: 25 | 30 days supply | Qty: 30 | Fill #0

## 2016-05-21 MED FILL — CYCLOBENZAPRINE 10 MG TAB: 10 | 20 days supply | Qty: 60 | Fill #0

## 2016-05-21 MED FILL — metFORMIN HCL 1000 MG TABS: 1000 | 30 days supply | Qty: 60 | Fill #0

## 2016-06-11 MED FILL — ATORVASTATIN 10 MG TABLET: 10 | 30 days supply | Qty: 30 | Fill #1

## 2016-06-11 MED FILL — FARXIGA 10 MG TABLET: 10 | 30 days supply | Qty: 30 | Fill #3

## 2016-06-13 MED FILL — GABAPENTIN 400 MG CAPSULE: 400 | 30 days supply | Qty: 90 | Fill #0

## 2016-06-17 MED FILL — ACETAMINOPHEN/COD #3 TABLET: 300-30 | 7 days supply | Qty: 42 | Fill #1

## 2016-06-20 MED FILL — DULoxetine HCL 30 MG CPEP: 30 | 30 days supply | Qty: 60 | Fill #2

## 2016-06-28 ENCOUNTER — Ambulatory Visit: Payer: Self-pay | Attending: Internal Medicine

## 2016-07-03 ENCOUNTER — Ambulatory Visit: Payer: Self-pay | Admitting: Internal Medicine

## 2016-07-15 MED FILL — metFORMIN HCL 1000 MG TABS: 1000 | 30 days supply | Qty: 60 | Fill #1

## 2016-07-15 MED FILL — CYCLOBENZAPRINE 10 MG TAB: 10 | 20 days supply | Qty: 60 | Fill #1

## 2016-07-15 MED FILL — FARXIGA 10 MG TABLET: 10 | 30 days supply | Qty: 30 | Fill #0

## 2016-07-15 MED FILL — glipiZIDE 10 MG TABS: 10 | 30 days supply | Qty: 60 | Fill #1

## 2016-07-17 ENCOUNTER — Encounter: Payer: Self-pay | Admitting: Internal Medicine

## 2016-07-17 ENCOUNTER — Ambulatory Visit: Payer: Self-pay | Attending: Internal Medicine | Admitting: Internal Medicine

## 2016-07-17 VITALS — BP 100/66 | HR 100 | Temp 98.7°F | Resp 18 | Ht 59.0 in | Wt 191.0 lb

## 2016-07-17 DIAGNOSIS — Z1239 Encounter for other screening for malignant neoplasm of breast: Secondary | ICD-10-CM

## 2016-07-17 DIAGNOSIS — M545 Low back pain: Secondary | ICD-10-CM

## 2016-07-17 DIAGNOSIS — R7989 Other specified abnormal findings of blood chemistry: Secondary | ICD-10-CM

## 2016-07-17 DIAGNOSIS — K219 Gastro-esophageal reflux disease without esophagitis: Secondary | ICD-10-CM | POA: Insufficient documentation

## 2016-07-17 DIAGNOSIS — Z79899 Other long term (current) drug therapy: Secondary | ICD-10-CM | POA: Insufficient documentation

## 2016-07-17 DIAGNOSIS — Z7982 Long term (current) use of aspirin: Secondary | ICD-10-CM | POA: Insufficient documentation

## 2016-07-17 DIAGNOSIS — M170 Bilateral primary osteoarthritis of knee: Secondary | ICD-10-CM | POA: Insufficient documentation

## 2016-07-17 DIAGNOSIS — Z7984 Long term (current) use of oral hypoglycemic drugs: Secondary | ICD-10-CM | POA: Insufficient documentation

## 2016-07-17 DIAGNOSIS — M48061 Spinal stenosis, lumbar region without neurogenic claudication: Secondary | ICD-10-CM | POA: Insufficient documentation

## 2016-07-17 DIAGNOSIS — G8929 Other chronic pain: Secondary | ICD-10-CM

## 2016-07-17 DIAGNOSIS — Z79891 Long term (current) use of opiate analgesic: Secondary | ICD-10-CM | POA: Insufficient documentation

## 2016-07-17 DIAGNOSIS — Z1231 Encounter for screening mammogram for malignant neoplasm of breast: Secondary | ICD-10-CM

## 2016-07-17 DIAGNOSIS — E785 Hyperlipidemia, unspecified: Secondary | ICD-10-CM | POA: Insufficient documentation

## 2016-07-17 DIAGNOSIS — F4323 Adjustment disorder with mixed anxiety and depressed mood: Secondary | ICD-10-CM | POA: Insufficient documentation

## 2016-07-17 DIAGNOSIS — I10 Essential (primary) hypertension: Secondary | ICD-10-CM | POA: Insufficient documentation

## 2016-07-17 DIAGNOSIS — E559 Vitamin D deficiency, unspecified: Secondary | ICD-10-CM

## 2016-07-17 DIAGNOSIS — E119 Type 2 diabetes mellitus without complications: Secondary | ICD-10-CM | POA: Insufficient documentation

## 2016-07-17 LAB — POCT UA - MICROALBUMIN
Creatinine, POC: 50 mg/dL
Microalbumin Ur, POC: 10 mg/L

## 2016-07-17 LAB — GLUCOSE, POCT (MANUAL RESULT ENTRY): POC Glucose: 186 mg/dl — AB (ref 70–99)

## 2016-07-17 LAB — POCT GLYCOSYLATED HEMOGLOBIN (HGB A1C): Hemoglobin A1C: 7.3

## 2016-07-17 MED ORDER — AMITRIPTYLINE HCL 75 MG PO TABS: 75.0000 mg | ORAL_TABLET | Freq: Every evening | ORAL | 3 refills | Status: AC | PRN

## 2016-07-17 MED ORDER — HYDROCHLOROTHIAZIDE 25 MG PO TABS: 25.0000 mg | ORAL_TABLET | Freq: Every day | ORAL | 3 refills | Status: AC

## 2016-07-17 MED ORDER — METFORMIN HCL 1000 MG PO TABS: 1000.0000 mg | ORAL_TABLET | Freq: Two times a day (BID) | ORAL | 3 refills | Status: AC

## 2016-07-17 MED ORDER — GABAPENTIN 400 MG PO CAPS: 400.0000 mg | ORAL_CAPSULE | Freq: Three times a day (TID) | ORAL | 3 refills | Status: AC

## 2016-07-17 MED ORDER — GLIPIZIDE 10 MG PO TABS: 10.0000 mg | ORAL_TABLET | Freq: Two times a day (BID) | ORAL | 3 refills | Status: AC

## 2016-07-17 MED ORDER — CYCLOBENZAPRINE HCL 10 MG PO TABS: 10.0000 mg | ORAL_TABLET | Freq: Three times a day (TID) | ORAL | 3 refills | Status: AC | PRN

## 2016-07-17 MED ORDER — DULOXETINE HCL 30 MG PO CPEP: ORAL_CAPSULE | ORAL | 3 refills | Status: DC

## 2016-07-17 MED ORDER — DAPAGLIFLOZIN PROPANEDIOL 10 MG PO TABS: 10.0000 mg | ORAL_TABLET | Freq: Every day | ORAL | 3 refills | Status: AC

## 2016-07-17 MED ORDER — VITAMIN D (ERGOCALCIFEROL) 1.25 MG (50000 UNIT) PO CAPS: 50000.0000 [IU] | ORAL_CAPSULE | ORAL | 1 refills | Status: AC

## 2016-07-17 MED ORDER — TRAMADOL HCL 50 MG PO TABS: 50.0000 mg | ORAL_TABLET | Freq: Three times a day (TID) | ORAL | 0 refills | Status: DC | PRN

## 2016-07-17 MED ORDER — ATORVASTATIN CALCIUM 10 MG PO TABS: 10.0000 mg | ORAL_TABLET | Freq: Every day | ORAL | 3 refills | Status: AC

## 2016-07-17 NOTE — Progress Notes (Signed)
Joy Solis, is a 51 y.o. female  TOI:[id number]  IPJ:[id number]  DOB - 08/08/1965  Chief Complaint  Patient presents with  . Diabetes       Subjective:   Joy Solis is a 51 y.o. female with history of type 2 diabetes mellitus without complications, hypertension, dyslipidemia, spinal stenosis status post surgery, chronic osteoarthritis of both knees, GERD and major depression here today for a follow up visit and medication refills. She complained of ongoing chronic pain especially her knees but she lost few pounds lately and some pressure off her knees makes the pain a little bearable. She claims adherence to her medications, reports no side effect. She denies depression, she denies any suicidal ideation or thoughts. She continues to smoke heavily about 1 pack of cigarettes per day, she says she is not ready to quit.  Patient has No headache, No chest pain, No abdominal pain - No Nausea, No new weakness tingling or numbness, No Cough - SOB. She is due for pap smear and mammogram.  Problem  Breast Cancer Screening    ALLERGIES: Allergies  Allergen Reactions  . Coreg [Carvedilol]     cough  . Lisinopril     cough    PAST MEDICAL HISTORY: Past Medical History:  Diagnosis Date  . Arthritis    degenerative lumbar spine, knees, hands  . Diabetes mellitus   . Diabetes mellitus, type II ([place])   . Dysrhythmia    Holter monitor- 2011  . Environmental allergies   . GERD (gastroesophageal reflux disease)    uses baking soda /w water   . Hyperlipidemia   . Hypertension   . Neuromuscular disorder (HCC)    trigeminal neuralgia , on R side, uses cotton in R ear whether the air gets cold.  Marland Kitchen Spinal stenosis of lumbar region    pt was injected on 06/29/2013  . Tuberculosis    on arrival from [place]- as a teenager, given pills for treatment     MEDICATIONS AT HOME: Prior to Admission medications   Medication Sig Start Date End Date Taking? Authorizing Provider  amitriptyline  (ELAVIL) 75 MG tablet Take 1 tablet (75 mg total) by mouth at bedtime as needed. 07/17/16  Yes Tresa Garter, MD  aspirin 81 MG tablet Take 81 mg by mouth daily.   Yes [provider]  atorvastatin (LIPITOR) 10 MG tablet Take 1 tablet (10 mg total) by mouth at bedtime. 07/17/16  Yes Tresa Garter, MD  cyclobenzaprine (FLEXERIL) 10 MG tablet Take 1 tablet (10 mg total) by mouth 3 (three) times daily as needed for muscle spasms. 07/17/16  Yes Tresa Garter, MD  dapagliflozin propanediol (FARXIGA) 10 MG TABS tablet Take 10 mg by mouth daily. 07/17/16  Yes Tresa Garter, MD  diphenhydrAMINE (SOMINEX) 25 MG tablet Take 25 mg by mouth at bedtime as needed for sleep.   Yes [provider]  gabapentin (NEURONTIN) 400 MG capsule Take 1 capsule (400 mg total) by mouth 3 (three) times daily. 07/17/16  Yes Tresa Garter, MD  glipiZIDE (GLUCOTROL) 10 MG tablet Take 1 tablet (10 mg total) by mouth 2 (two) times daily before a meal. 07/17/16  Yes Mollye Guinta E, MD  glucose monitoring kit (FREESTYLE) monitoring kit 1 each by Does not apply route 4 (four) times daily - after meals and at bedtime. 1 month Diabetic Testing Supplies for QAC-QHS accuchecks. Any manufacturer is acceptable. 04/09/13  Yes Reyne Dumas, MD  hydrochlorothiazide (HYDRODIURIL) 25 MG tablet Take  1 tablet (25 mg total) by mouth daily. 07/17/16  Yes Tresa Garter, MD  Menthol-Methyl Salicylate (BEN GAY GREASELESS) 10-15 % greaseless cream Apply 1 application topically 3 (three) times daily as needed for pain (to knee).   Yes [provider]  metFORMIN (GLUCOPHAGE) 1000 MG tablet Take 1 tablet (1,000 mg total) by mouth 2 (two) times daily with a meal. 07/17/16  Yes Tresa Garter, MD  traZODone (DESYREL) 100 MG tablet 2  qhs may increase after 1 week to 3  qhs 02/28/16  Yes Plovsky, Berneta Sages, MD  DULoxetine (CYMBALTA) 30 MG capsule 1  qam for 3 days then 2 qam 07/17/16   Tresa Garter, MD  hydrOXYzine (VISTARIL) 25 MG capsule 1 qam 1 @ 5;00 and 1 prn qday Patient not taking: Reported on 04/11/2016 02/28/16   Norma Fredrickson, MD  traMADol (ULTRAM) 50 MG tablet Take 1 tablet (50 mg total) by mouth every 8 (eight) hours as needed. 07/17/16   Tresa Garter, MD  Vitamin D, Ergocalciferol, (DRISDOL) 50000 units CAPS capsule Take 1 capsule (50,000 Units total) by mouth every 7 (seven) days. 07/17/16   Tresa Garter, MD    Objective:   Vitals:   07/17/16 1033  BP: 100/66  Pulse: 100  Resp: 18  Temp: 98.7 F (37.1 C)  TempSrc: Oral  SpO2: 98%  Weight: 191 lb (86.6 kg)  Height: '4\' 11"'$  (1.499 m)   Exam General appearance : Awake, alert, not in any distress. Speech Clear. Not toxic looking, obese HEENT: Atraumatic and Normocephalic, pupils equally reactive to light and accomodation Neck: Supple, no JVD. No cervical lymphadenopathy.  Chest: Good air entry bilaterally, no added sounds  CVS: S1 S2 regular, no murmurs.  Abdomen: Bowel sounds present, Non tender and not distended with no gaurding, rigidity or rebound. Extremities: B/L Lower Ext shows no edema, both legs are warm to touch Neurology: Awake alert, and oriented X 3, CN II-XII intact, Non focal Skin: No Rash  Data Review Lab Results  Component Value Date   HGBA1C 7.3 07/17/2016   HGBA1C 7.7 02/22/2016   HGBA1C 8.3 11/02/2015    Assessment & Plan   1. Type 2 diabetes mellitus without complication, without long-term current use of insulin (HCC)  - POCT A1C - POCT UA - Microalbumin - Glucose (CBG)  - metFORMIN (GLUCOPHAGE) 1000 MG tablet; Take 1 tablet (1,000 mg total) by mouth 2 (two) times daily with a meal.  Dispense: 180 tablet; Refill: 3 - glipiZIDE (GLUCOTROL) 10 MG tablet; Take 1 tablet (10 mg total) by mouth 2 (two) times daily before a meal.  Dispense: 180 tablet; Refill: 3 - gabapentin (NEURONTIN) 400 MG capsule; Take 1 capsule (400 mg total) by mouth 3 (three) times  daily.  Dispense: 270 capsule; Refill: 3 - dapagliflozin propanediol (FARXIGA) 10 MG TABS tablet; Take 10 mg by mouth daily.  Dispense: 30 tablet; Refill: 3 - CBC with Differential/Platelet - Lipid panel  2. Essential hypertension  - hydrochlorothiazide (HYDRODIURIL) 25 MG tablet; Take 1 tablet (25 mg total) by mouth daily.  Dispense: 90 tablet; Refill: 3 - CMP14+EGFR  3. Chronic midline low back pain without sciatica  - gabapentin (NEURONTIN) 400 MG capsule; Take 1 capsule (400 mg total) by mouth 3 (three) times daily.  Dispense: 270 capsule; Refill: 3 - cyclobenzaprine (FLEXERIL) 10 MG tablet; Take 1 tablet (10 mg total) by mouth 3 (three) times daily as needed for muscle spasms.  Dispense: 60 tablet; Refill: 3 - amitriptyline (  ELAVIL) 75 MG tablet; Take 1 tablet (75 mg total) by mouth at bedtime as needed.  Dispense: 30 tablet; Refill: 3  4. Adjustment disorder with mixed anxiety and depressed mood  - DULoxetine (CYMBALTA) 30 MG capsule; 1  qam for 3 days then 2 qam  Dispense: 60 capsule; Refill: 3  5. Dyslipidemia  - atorvastatin (LIPITOR) 10 MG tablet; Take 1 tablet (10 mg total) by mouth at bedtime.  Dispense: 90 tablet; Refill: 3  6. Breast cancer screening  - MM Digital Screening; Future  7. Low vitamin D level  - VITAMIN D 25 Hydroxy (Vit-D Deficiency, Fractures) - Vitamin D, Ergocalciferol, (DRISDOL) 50000 units CAPS capsule; Take 1 capsule (50,000 Units total) by mouth every 7 (seven) days.  Dispense: 12 capsule; Refill: 1  Patient have been counseled extensively about nutrition and exercise. Other issues discussed during this visit include: low cholesterol diet, weight control and daily exercise, foot care, annual eye examinations at Ophthalmology, importance of adherence with medications and regular follow-up. We also discussed long term complications of uncontrolled diabetes and hypertension.   Return in about 2 weeks (around 07/31/2016) for Pap Smear Only.  The  patient was given clear instructions to go to ER or return to medical center if symptoms don't improve, worsen or new problems develop. The patient verbalized understanding. The patient was told to call to get lab results if they haven't heard anything in the next week.   This note has been created with Surveyor, quantity. Any transcriptional errors are unintentional.    Angelica Chessman, MD, [place], Karilyn Cota, [place] and [facility] [place], [place]   07/17/2016, 11:58 AM

## 2016-07-17 NOTE — Patient Instructions (Signed)
Diabetes Mellitus and Exercise Exercising regularly is important for your overall health, especially when you have diabetes (diabetes mellitus). Exercising is not only about losing weight. It has many health benefits, such as increasing muscle strength and bone density and reducing body fat and stress. This leads to improved fitness, flexibility, and endurance, all of which result in better overall health. Exercise has additional benefits for people with diabetes, including:  Reducing appetite.  Helping to lower and control blood glucose.  Lowering blood pressure.  Helping to control amounts of fatty substances (lipids) in the blood, such as cholesterol and triglycerides.  Helping the body to respond better to insulin (improving insulin sensitivity).  Reducing how much insulin the body needs.  Decreasing the risk for heart disease by:  Lowering cholesterol and triglyceride levels.  Increasing the levels of good cholesterol.  Lowering blood glucose levels. What is my activity plan? Your health care provider or certified diabetes educator can help you make a plan for the type and frequency of exercise (activity plan) that works for you. Make sure that you:  Do at least 150 minutes of moderate-intensity or vigorous-intensity exercise each week. This could be brisk walking, biking, or water aerobics.  Do stretching and strength exercises, such as yoga or weightlifting, at least 2 times a week.  Spread out your activity over at least 3 days of the week.  Get some form of physical activity every day.  Do not go more than 2 days in a row without some kind of physical activity.  Avoid being inactive for more than 90 minutes at a time. Take frequent breaks to walk or stretch.  Choose a type of exercise or activity that you enjoy, and set realistic goals.  Start slowly, and gradually increase the intensity of your exercise over time. What do I need to know about managing my  diabetes?  Check your blood glucose before and after exercising.  If your blood glucose is higher than 240 mg/dL (13.3 mmol/L) before you exercise, check your urine for ketones. If you have ketones in your urine, do not exercise until your blood glucose returns to normal.  Know the symptoms of low blood glucose (hypoglycemia) and how to treat it. Your risk for hypoglycemia increases during and after exercise. Common symptoms of hypoglycemia can include:  Hunger.  Anxiety.  Sweating and feeling clammy.  Confusion.  Dizziness or feeling light-headed.  Increased heart rate or palpitations.  Blurry vision.  Tingling or numbness around the mouth, lips, or tongue.  Tremors or shakes.  Irritability.  Keep a rapid-acting carbohydrate snack available before, during, and after exercise to help prevent or treat hypoglycemia.  Avoid injecting insulin into areas of the body that are going to be exercised. For example, avoid injecting insulin into:  The arms, when playing tennis.  The legs, when jogging.  Keep records of your exercise habits. Doing this can help you and your health care provider adjust your diabetes management plan as needed. Write down:  Food that you eat before and after you exercise.  Blood glucose levels before and after you exercise.  The type and amount of exercise you have done.  When your insulin is expected to peak, if you use insulin. Avoid exercising at times when your insulin is peaking.  When you start a new exercise or activity, work with your health care provider to make sure the activity is safe for you, and to adjust your insulin, medicines, or food intake as needed.  Drink plenty   of water while you exercise to prevent dehydration or heat stroke. Drink enough fluid to keep your urine clear or pale yellow. This information is not intended to replace advice given to you by your health care provider. Make sure you discuss any questions you have with  your health care provider. Document Released: 05/11/2003 Document Revised: 09/08/2015 Document Reviewed: 07/31/2015 Elsevier Interactive Patient Education  2017 [employer] Blood Glucose Monitoring, Adult Monitoring your blood sugar (glucose) helps you manage your diabetes. It also helps you and your health care provider determine how well your diabetes management plan is working. Blood glucose monitoring involves checking your blood glucose as often as directed, and keeping a record (log) of your results over time. Why should I monitor my blood glucose? Checking your blood glucose regularly can:  Help you understand how food, exercise, illnesses, and medicines affect your blood glucose.  Let you know what your blood glucose is at any time. You can quickly tell if you are having low blood glucose (hypoglycemia) or high blood glucose (hyperglycemia).  Help you and your health care provider adjust your medicines as needed. When should I check my blood glucose? Follow instructions from your health care provider about how often to check your blood glucose. This may depend on:  The type of diabetes you have.  How well-controlled your diabetes is.  Medicines you are taking. If you have type 1 diabetes:   Check your blood glucose at least 2 times a day.  Also check your blood glucose:  Before every insulin injection.  Before and after exercise.  Between meals.  2 hours after a meal.  Occasionally between 2:00 a.m. and 3:00 a.m., as directed.  Before potentially dangerous tasks, like driving or using heavy machinery.  At bedtime.  You may need to check your blood glucose more often, up to 6-10 times a day:  If you use an insulin pump.  If you need multiple daily injections (MDI).  If your diabetes is not well-controlled.  If you are ill.  If you have a history of severe hypoglycemia.  If you have a history of not knowing when your blood glucose is getting low  (hypoglycemia unawareness). If you have type 2 diabetes:   If you take insulin or other diabetes medicines, check your blood glucose at least 2 times a day.  If you are on intensive insulin therapy, check your blood glucose at least 4 times a day. Occasionally, you may also need to check between 2:00 a.m. and 3:00 a.m., as directed.  Also check your blood glucose:  Before and after exercise.  Before potentially dangerous tasks, like driving or using heavy machinery.  You may need to check your blood glucose more often if:  Your medicine is being adjusted.  Your diabetes is not well-controlled.  You are ill. What is a blood glucose log?  A blood glucose log is a record of your blood glucose readings. It helps you and your health care provider:  Look for patterns in your blood glucose over time.  Adjust your diabetes management plan as needed.  Every time you check your blood glucose, write down your result and notes about things that may be affecting your blood glucose, such as your diet and exercise for the day.  Most glucose meters store a record of glucose readings in the meter. Some meters allow you to download your records to a computer. How do I check my blood glucose? Follow these steps to get accurate readings of   your blood glucose: Supplies needed    Blood glucose meter.  Test strips for your meter. Each meter has its own strips. You must use the strips that come with your meter.  A needle to prick your finger (lancet). Do not use lancets more than once.  A device that holds the lancet (lancing device).  A journal or log book to write down your results. Procedure   Wash your hands with soap and water.  Prick the side of your finger (not the tip) with the lancet. Use a different finger each time.  Gently rub the finger until a small drop of blood appears.  Follow instructions that come with your meter for inserting the test strip, applying blood to the  strip, and using your blood glucose meter.  Write down your result and any notes. Alternative testing sites   Some meters allow you to use areas of your body other than your finger (alternative sites) to test your blood.  If you think you may have hypoglycemia, or if you have hypoglycemia unawareness, do not use alternative sites. Use your finger instead.  Alternative sites may not be as accurate as the fingers, because blood flow is slower in these areas. This means that the result you get may be delayed, and it may be different from the result that you would get from your finger.  The most common alternative sites are:  Forearm.  Thigh.  Palm of the hand. Additional tips   Always keep your supplies with you.  If you have questions or need help, all blood glucose meters have a 24-hour "hotline" number that you can call. You may also contact your health care provider.  After you use a few boxes of test strips, adjust (calibrate) your blood glucose meter by following instructions that came with your meter. This information is not intended to replace advice given to you by your health care provider. Make sure you discuss any questions you have with your health care provider. Document Released: 02/21/2003 Document Revised: 09/08/2015 Document Reviewed: 07/31/2015 Elsevier Interactive Patient Education  2017 [employer] Inc.  

## 2016-07-17 NOTE — Progress Notes (Signed)
Patient is here for DM FU  Patient complains of body aches still being present and would like Vitamin d level rechecked.  Patient has taken medication today. Patient has eaten today.  Patient would prefer to take tramadol instead of tylenol 3 but patient has both medications in hand.

## 2016-07-18 LAB — LIPID PANEL
CHOLESTEROL TOTAL: 191 mg/dL (ref 100–199)
Chol/HDL Ratio: 4.7 ratio — ABNORMAL HIGH (ref 0.0–4.4)
HDL: 41 mg/dL (ref >39)
LDL CALC: 116 mg/dL — AB (ref 0–99)
Triglycerides: 168 mg/dL — ABNORMAL HIGH (ref 0–149)
VLDL CHOLESTEROL CAL: 34 mg/dL (ref 5–40)

## 2016-07-18 LAB — CBC WITH DIFFERENTIAL/PLATELET
BASOS ABS: 0 10*3/uL (ref 0.0–0.2)
Basos: 0 %
EOS (ABSOLUTE): 0.2 10*3/uL (ref 0.0–0.4)
Eos: 2 %
HEMOGLOBIN: 14.1 g/dL (ref 11.1–15.9)
Hematocrit: 43 % (ref 34.0–46.6)
IMMATURE GRANS (ABS): 0.1 10*3/uL (ref 0.0–0.1)
Immature Granulocytes: 1 %
LYMPHS: 25 %
Lymphocytes Absolute: 2.7 10*3/uL (ref 0.7–3.1)
MCH: 27.6 pg (ref 26.6–33.0)
MCHC: 32.8 g/dL (ref 31.5–35.7)
MCV: 84 fL (ref 79–97)
MONOCYTES: 8 %
Monocytes Absolute: 0.8 10*3/uL (ref 0.1–0.9)
NEUTROS ABS: 7 10*3/uL (ref 1.4–7.0)
Neutrophils: 64 %
PLATELETS: 348 10*3/uL (ref 150–379)
RBC: 5.11 x10E6/uL (ref 3.77–5.28)
RDW: 16.5 % — ABNORMAL HIGH (ref 12.3–15.4)
WBC: 10.9 10*3/uL — AB (ref 3.4–10.8)

## 2016-07-18 LAB — CMP14+EGFR
ALBUMIN: 4.5 g/dL (ref 3.5–5.5)
ALK PHOS: 103 IU/L (ref 39–117)
ALT: 12 IU/L (ref 0–32)
AST: 12 IU/L (ref 0–40)
Albumin/Globulin Ratio: 1.7 (ref 1.2–2.2)
BILIRUBIN TOTAL: 0.5 mg/dL (ref 0.0–1.2)
BUN/Creatinine Ratio: 16 (ref 9–23)
BUN: 12 mg/dL (ref 6–24)
CHLORIDE: 101 mmol/L (ref 96–106)
CO2: 22 mmol/L (ref 18–29)
CREATININE: 0.73 mg/dL (ref 0.57–1.00)
Calcium: 9.4 mg/dL (ref 8.7–10.2)
GFR calc Af Amer: 110 mL/min/{1.73_m2} (ref >59)
GFR calc non Af Amer: 95 mL/min/{1.73_m2} (ref >59)
GLUCOSE: 129 mg/dL — AB (ref 65–99)
Globulin, Total: 2.6 g/dL (ref 1.5–4.5)
Potassium: 4.1 mmol/L (ref 3.5–5.2)
Sodium: 140 mmol/L (ref 134–144)
TOTAL PROTEIN: 7.1 g/dL (ref 6.0–8.5)

## 2016-07-18 LAB — VITAMIN D 25 HYDROXY (VIT D DEFICIENCY, FRACTURES): Vit D, 25-Hydroxy: 23.8 ng/mL — ABNORMAL LOW (ref 30.0–100.0)

## 2016-07-22 ENCOUNTER — Ambulatory Visit: Payer: Self-pay | Attending: Internal Medicine

## 2016-07-31 ENCOUNTER — Encounter: Payer: Self-pay | Admitting: Internal Medicine

## 2016-07-31 ENCOUNTER — Ambulatory Visit: Payer: Self-pay | Attending: Internal Medicine | Admitting: Internal Medicine

## 2016-07-31 VITALS — BP 123/84 | HR 88 | Temp 98.3°F | Resp 16 | Wt 197.2 lb

## 2016-07-31 DIAGNOSIS — M48061 Spinal stenosis, lumbar region without neurogenic claudication: Secondary | ICD-10-CM | POA: Insufficient documentation

## 2016-07-31 DIAGNOSIS — Z0000 Encounter for general adult medical examination without abnormal findings: Secondary | ICD-10-CM

## 2016-07-31 DIAGNOSIS — Z23 Encounter for immunization: Secondary | ICD-10-CM

## 2016-07-31 DIAGNOSIS — E785 Hyperlipidemia, unspecified: Secondary | ICD-10-CM | POA: Insufficient documentation

## 2016-07-31 DIAGNOSIS — Z124 Encounter for screening for malignant neoplasm of cervix: Secondary | ICD-10-CM

## 2016-07-31 DIAGNOSIS — E119 Type 2 diabetes mellitus without complications: Secondary | ICD-10-CM | POA: Insufficient documentation

## 2016-07-31 DIAGNOSIS — I10 Essential (primary) hypertension: Secondary | ICD-10-CM | POA: Insufficient documentation

## 2016-07-31 DIAGNOSIS — M170 Bilateral primary osteoarthritis of knee: Secondary | ICD-10-CM | POA: Insufficient documentation

## 2016-07-31 DIAGNOSIS — Z01419 Encounter for gynecological examination (general) (routine) without abnormal findings: Secondary | ICD-10-CM | POA: Insufficient documentation

## 2016-07-31 DIAGNOSIS — K219 Gastro-esophageal reflux disease without esophagitis: Secondary | ICD-10-CM | POA: Insufficient documentation

## 2016-07-31 DIAGNOSIS — Z7984 Long term (current) use of oral hypoglycemic drugs: Secondary | ICD-10-CM | POA: Insufficient documentation

## 2016-07-31 DIAGNOSIS — B373 Candidiasis of vulva and vagina: Secondary | ICD-10-CM | POA: Insufficient documentation

## 2016-07-31 DIAGNOSIS — B3731 Acute candidiasis of vulva and vagina: Secondary | ICD-10-CM

## 2016-07-31 DIAGNOSIS — F17210 Nicotine dependence, cigarettes, uncomplicated: Secondary | ICD-10-CM | POA: Insufficient documentation

## 2016-07-31 DIAGNOSIS — Z7982 Long term (current) use of aspirin: Secondary | ICD-10-CM | POA: Insufficient documentation

## 2016-07-31 LAB — GLUCOSE, POCT (MANUAL RESULT ENTRY): POC Glucose: 160 mg/dl — AB (ref 70–99)

## 2016-07-31 MED ORDER — MICONAZOLE NITRATE 2 % VA CREA: 1.0000 | TOPICAL_CREAM | Freq: Every day | VAGINAL | 3 refills | Status: AC

## 2016-07-31 MED ORDER — MICONAZOLE NITRATE 2 % VA CREA: 1.0000 | TOPICAL_CREAM | Freq: Every day | VAGINAL | 3 refills | Status: DC

## 2016-07-31 NOTE — Progress Notes (Signed)
Joy Solis, is a 51 y.o. female  QVZ:[id number]  PIR:[id number]  DOB - 1965-06-02  Chief Complaint  Patient presents with  . Gynecologic Exam       Subjective:   Joy Solis is a 51 y.o. female  with history of type 2 diabetes mellitus without complications, hypertension, dyslipidemia, spinal stenosis status post surgery, chronic osteoarthritis of both knees, GERD and major depression here today for Pap Smear for cervical cancer screening. She has no complaint. She is relocating to [place] in 2 weeks. She is not sexually active at the moment (for over a year). She denies any major vaginal discharge. Her menstrual period has been irregular lately, off for about 2 months and this month came twice. She attributes this to possible early menopause. She continues to smoke heavily about 1 pack of cigarettes per day, she says she is not ready to quit. Patient has No headache, No chest pain, No abdominal pain - No Nausea, No new weakness tingling or numbness, No Cough - SOB.  No problems updated.  ALLERGIES: Allergies  Allergen Reactions  . Coreg [Carvedilol]     cough  . Lisinopril     cough    PAST MEDICAL HISTORY: Past Medical History:  Diagnosis Date  . Arthritis    degenerative lumbar spine, knees, hands  . Diabetes mellitus   . Diabetes mellitus, type II ([place])   . Dysrhythmia    Holter monitor- 2011  . Environmental allergies   . GERD (gastroesophageal reflux disease)    uses baking soda /w water   . Hyperlipidemia   . Hypertension   . Neuromuscular disorder (HCC)    trigeminal neuralgia , on R side, uses cotton in R ear whether the air gets cold.  Marland Kitchen Spinal stenosis of lumbar region    pt was injected on 06/29/2013  . Tuberculosis    on arrival from [place]- as a teenager, given pills for treatment     MEDICATIONS AT HOME: Prior to Admission medications   Medication Sig Start Date End Date Taking? Authorizing Provider  amitriptyline (ELAVIL) 75 MG tablet Take 1  tablet (75 mg total) by mouth at bedtime as needed. 07/17/16   Tresa Garter, MD  aspirin 81 MG tablet Take 81 mg by mouth daily.    [provider]  atorvastatin (LIPITOR) 10 MG tablet Take 1 tablet (10 mg total) by mouth at bedtime. 07/17/16   Tresa Garter, MD  cyclobenzaprine (FLEXERIL) 10 MG tablet Take 1 tablet (10 mg total) by mouth 3 (three) times daily as needed for muscle spasms. 07/17/16   Tresa Garter, MD  dapagliflozin propanediol (FARXIGA) 10 MG TABS tablet Take 10 mg by mouth daily. 07/17/16   Tresa Garter, MD  diphenhydrAMINE (SOMINEX) 25 MG tablet Take 25 mg by mouth at bedtime as needed for sleep.    [provider]  DULoxetine (CYMBALTA) 30 MG capsule 1  qam for 3 days then 2 qam 07/17/16   Tresa Garter, MD  gabapentin (NEURONTIN) 400 MG capsule Take 1 capsule (400 mg total) by mouth 3 (three) times daily. 07/17/16   Tresa Garter, MD  glipiZIDE (GLUCOTROL) 10 MG tablet Take 1 tablet (10 mg total) by mouth 2 (two) times daily before a meal. 07/17/16   Herve Haug E, MD  glucose monitoring kit (FREESTYLE) monitoring kit 1 each by Does not apply route 4 (four) times daily - after meals and at bedtime. 1 month Diabetic Testing Supplies for QAC-QHS accuchecks.  Any manufacturer is acceptable. 04/09/13   Reyne Dumas, MD  hydrochlorothiazide (HYDRODIURIL) 25 MG tablet Take 1 tablet (25 mg total) by mouth daily. 07/17/16   Tresa Garter, MD  hydrOXYzine (VISTARIL) 25 MG capsule 1 qam 1 @ 5;00 and 1 prn qday Patient not taking: Reported on 04/11/2016 02/28/16   Norma Fredrickson, MD  Menthol-Methyl Salicylate (BEN GAY GREASELESS) 10-15 % greaseless cream Apply 1 application topically 3 (three) times daily as needed for pain (to knee).    [provider]  metFORMIN (GLUCOPHAGE) 1000 MG tablet Take 1 tablet (1,000 mg total) by mouth 2 (two) times daily with a meal. 07/17/16   Kaylem Gidney E, MD  miconazole (MONISTAT  7) 2 % vaginal cream Place 1 Applicatorful vaginally at bedtime. 07/31/16   Tresa Garter, MD  traMADol (ULTRAM) 50 MG tablet Take 1 tablet (50 mg total) by mouth every 8 (eight) hours as needed. 07/17/16   Tresa Garter, MD  traZODone (DESYREL) 100 MG tablet 2  qhs may increase after 1 week to 3  qhs 02/28/16   Plovsky, Berneta Sages, MD  Vitamin D, Ergocalciferol, (DRISDOL) 50000 units CAPS capsule Take 1 capsule (50,000 Units total) by mouth every 7 (seven) days. 07/17/16   Tresa Garter, MD    Objective:   Vitals:   07/31/16 1550  BP: 123/84  Pulse: 88  Resp: 16  Temp: 98.3 F (36.8 C)  TempSrc: Oral  SpO2: 97%  Weight: 197 lb 3.2 oz (89.4 kg)   Exam General appearance : Awake, alert, not in any distress. Speech Clear. Not toxic looking, obese HEENT: Atraumatic and Normocephalic, pupils equally reactive to light and accomodation Neck: Supple, no JVD. No cervical lymphadenopathy.  Chest: Good air entry bilaterally, no added sounds  CVS: S1 S2 regular, no murmurs.  Abdomen: Bowel sounds present, Non tender and not distended with no gaurding, rigidity or rebound. Extremities: B/L Lower Ext shows no edema, both legs are warm to touch Neurology: Awake alert, and oriented X 3, CN II-XII intact, Non focal Skin: No Rash  Pelvic Exam: Cervix normal in appearance, external genitalia normal, no adnexal masses or tenderness, no cervical motion tenderness, rectovaginal septum normal, uterus normal size, shape, and consistency and vagina normal without discharge   Data Review Lab Results  Component Value Date   HGBA1C 7.3 07/17/2016   HGBA1C 7.7 02/22/2016   HGBA1C 8.3 11/02/2015    Assessment & Plan   1. Type 2 diabetes mellitus without complication, without long-term current use of insulin (HCC)  - POCT glucose (manual entry)  2. Essential hypertension  We have discussed target BP range and blood pressure goal. I have advised patient to check BP regularly and to  call us back or report to clinic if the numbers are consistently higher than 140/90. We discussed the importance of compliance with medical therapy and DASH diet recommended, consequences of uncontrolled hypertension discussed.  - continue current BP medications  3. Cervical cancer screening  - Cytology - PAP - Cervicovaginal ancillary only  4. Healthcare maintenance  - HIV antibody (with reflex) - Hepatitis c antibody (reflex) - MM Digital Screening; Future - Pneumococcal polysaccharide vaccine 23-valent greater than or equal to 2yo subcutaneous/IM  5. Vaginal candidosis  - miconazole (MONISTAT 7) 2 % vaginal cream; Place 1 Applicatorful vaginally at bedtime.  Dispense: 45 g; Refill: 3  Patient have been counseled extensively about nutrition and exercise. Other issues discussed during this visit include: low cholesterol diet, weight control and daily  exercise, foot care, annual eye examinations at Ophthalmology, importance of adherence with medications and regular follow-up. We also discussed long term complications of uncontrolled diabetes and hypertension.   Return in about 6 months (around 01/31/2017) for Follow up HTN.  The patient was given clear instructions to go to ER or return to medical center if symptoms don't improve, worsen or new problems develop. The patient verbalized understanding. The patient was told to call to get lab results if they haven't heard anything in the next week.   This note has been created with Surveyor, quantity. Any transcriptional errors are unintentional.    Angelica Chessman, MD, [place], [place], [place], [place] and [place], [place]   07/31/2016, 4:20 PM

## 2016-07-31 NOTE — Patient Instructions (Addendum)
Pneumococcal Polysaccharide Vaccine: What You Need to Know 1. Why get vaccinated? Vaccination can protect older adults (and some children and younger adults) from pneumococcal disease. Pneumococcal disease is caused by bacteria that can spread from person to person through close contact. It can cause ear infections, and it can also lead to more serious infections of the:  Lungs (pneumonia),  Blood (bacteremia), and  Covering of the brain and spinal cord (meningitis). Meningitis can cause deafness and brain damage, and it can be fatal. Anyone can get pneumococcal disease, but children under 57 years of age, people with certain medical conditions, adults over 66 years of age, and cigarette smokers are at the highest risk. About 18,000 older adults die each year from pneumococcal disease in the [place]. Treatment of pneumococcal infections with penicillin and other drugs used to be more effective. But some strains of the disease have become resistant to these drugs. This makes prevention of the disease, through vaccination, even more important. 2. Pneumococcal polysaccharide vaccine (PPSV23) Pneumococcal polysaccharide vaccine (PPSV23) protects against 23 types of pneumococcal bacteria. It will not prevent all pneumococcal disease. PPSV23 is recommended for:  All adults 59 years of age and older,  Anyone 2 through 53 years of age with certain long-term health problems,  Anyone 2 through 53 years of age with a weakened immune system,  Adults 38 through 53 years of age who smoke cigarettes or have asthma. Most people need only one dose of PPSV. A second dose is recommended for certain high-risk groups. People 66 and older should get a dose even if they have gotten one or more doses of the vaccine before they turned 65. Your healthcare provider can give you more information about these recommendations. Most healthy adults develop protection within 2 to 3 weeks of getting the shot. 3. Some  people should not get this vaccine  Anyone who has had a life-threatening allergic reaction to PPSV should not get another dose.  Anyone who has a severe allergy to any component of PPSV should not receive it. Tell your provider if you have any severe allergies.  Anyone who is moderately or severely ill when the shot is scheduled may be asked to wait until they recover before getting the vaccine. Someone with a mild illness can usually be vaccinated.  Children less than 42 years of age should not receive this vaccine.  There is no evidence that PPSV is harmful to either a pregnant woman or to her fetus. However, as a precaution, women who need the vaccine should be vaccinated before becoming pregnant, if possible. 4. Risks of a vaccine reaction With any medicine, including vaccines, there is a chance of side effects. These are usually mild and go away on their own, but serious reactions are also possible. About half of people who get PPSV have mild side effects, such as redness or pain where the shot is given, which go away within about two days. Less than 1 out of 100 people develop a fever, muscle aches, or more severe local reactions. Problems that could happen after any vaccine:   People sometimes faint after a medical procedure, including vaccination. Sitting or lying down for about 15 minutes can help prevent fainting, and injuries caused by a fall. Tell your doctor if you feel dizzy, or have vision changes or ringing in the ears.  Some people get severe pain in the shoulder and have difficulty moving the arm where a shot was given. This happens very rarely.  Any medication  can cause a severe allergic reaction. Such reactions from a vaccine are very rare, estimated at about 1 in a million doses, and would happen within a few minutes to a few hours after the vaccination. As with any medicine, there is a very remote chance of a vaccine causing a serious injury or death. The safety of  vaccines is always being monitored. For more information, visit: http://www.aguilar.org/ 5. What if there is a serious reaction? What should I look for?  Look for anything that concerns you, such as signs of a severe allergic reaction, very high fever, or unusual behavior. Signs of a severe allergic reaction can include hives, swelling of the face and throat, difficulty breathing, a fast heartbeat, dizziness, and weakness. These would usually start a few minutes to a few hours after the vaccination. What should I do?  If you think it is a severe allergic reaction or other emergency that can't wait, call 9-1-1 or get to the nearest hospital. Otherwise, call your doctor. Afterward, the reaction should be reported to the Vaccine Adverse Event Reporting System (VAERS). Your doctor might file this report, or you can do it yourself through the VAERS web site at www.vaers.SamedayNews.es, or by calling 775-885-5860. VAERS does not give medical advice.  6. How can I learn more?  Ask your doctor. He or she can give you the vaccine package insert or suggest other sources of information.  Call your local or state health department.  Contact the Centers for Disease Control and Prevention (CDC):  Call 613 155 5546 (1-800-CDC-INFO) or  Visit CDC's website at http://hunter.com/ CDC Pneumococcal Polysaccharide Vaccine VIS (06/25/13) This information is not intended to replace advice given to you by your health care provider. Make sure you discuss any questions you have with your health care provider. Document Released: 12/16/2005 Document Revised: 11/09/2015 Document Reviewed: 11/09/2015 Elsevier Interactive Patient Education  2017 [place]. Diabetes Mellitus and Food It is important for you to manage your blood sugar (glucose) level. Your blood glucose level can be greatly affected by what you eat. Eating healthier foods in the appropriate amounts throughout the day at about the same time each day  will help you control your blood glucose level. It can also help slow or prevent worsening of your diabetes mellitus. Healthy eating may even help you improve the level of your blood pressure and reach or maintain a healthy weight. General recommendations for healthful eating and cooking habits include:  Eating meals and snacks regularly. Avoid going long periods of time without eating to lose weight.  Eating a diet that consists mainly of plant-based foods, such as fruits, vegetables, nuts, legumes, and whole grains.  Using low-heat cooking methods, such as baking, instead of high-heat cooking methods, such as deep frying. Work with your dietitian to make sure you understand how to use the Nutrition Facts information on food labels. How can food affect me? Carbohydrates  Carbohydrates affect your blood glucose level more than any other type of food. Your dietitian will help you determine how many carbohydrates to eat at each meal and teach you how to count carbohydrates. Counting carbohydrates is important to keep your blood glucose at a healthy level, especially if you are using insulin or taking certain medicines for diabetes mellitus. Alcohol  Alcohol can cause sudden decreases in blood glucose (hypoglycemia), especially if you use insulin or take certain medicines for diabetes mellitus. Hypoglycemia can be a life-threatening condition. Symptoms of hypoglycemia (sleepiness, dizziness, and disorientation) are similar to symptoms of having too  much alcohol. If your health care provider has given you approval to drink alcohol, do so in moderation and use the following guidelines:  Women should not have more than one drink per day, and men should not have more than two drinks per day. One drink is equal to:  12 oz of beer.  5 oz of wine.  1 oz of hard liquor.  Do not drink on an empty stomach.  Keep yourself hydrated. Have water, diet soda, or unsweetened iced tea.  Regular soda, juice,  and other mixers might contain a lot of carbohydrates and should be counted. What foods are not recommended? As you make food choices, it is important to remember that all foods are not the same. Some foods have fewer nutrients per serving than other foods, even though they might have the same number of calories or carbohydrates. It is difficult to get your body what it needs when you eat foods with fewer nutrients. Examples of foods that you should avoid that are high in calories and carbohydrates but low in nutrients include:  Trans fats (most processed foods list trans fats on the Nutrition Facts label).  Regular soda.  Juice.  Candy.  Sweets, such as cake, pie, doughnuts, and cookies.  Fried foods. What foods can I eat? Eat nutrient-rich foods, which will nourish your body and keep you healthy. The food you should eat also will depend on several factors, including:  The calories you need.  The medicines you take.  Your weight.  Your blood glucose level.  Your blood pressure level.  Your cholesterol level. You should eat a variety of foods, including:  Protein.  Lean cuts of meat.  Proteins low in saturated fats, such as fish, egg whites, and beans. Avoid processed meats.  Fruits and vegetables.  Fruits and vegetables that may help control blood glucose levels, such as apples, mangoes, and yams.  Dairy products.  Choose fat-free or low-fat dairy products, such as milk, yogurt, and cheese.  Grains, bread, pasta, and rice.  Choose whole grain products, such as multigrain bread, whole oats, and brown rice. These foods may help control blood pressure.  Fats.  Foods containing healthful fats, such as nuts, avocado, olive oil, canola oil, and fish. Does everyone with diabetes mellitus have the same meal plan? Because every person with diabetes mellitus is different, there is not one meal plan that works for everyone. It is very important that you meet with a  dietitian who will help you create a meal plan that is just right for you. This information is not intended to replace advice given to you by your health care provider. Make sure you discuss any questions you have with your health care provider. Document Released: 11/15/2004 Document Revised: 07/27/2015 Document Reviewed: 01/15/2013 Elsevier Interactive Patient Education  2017 [place].  Diabetes Mellitus and Exercise Exercising regularly is important for your overall health, especially when you have diabetes (diabetes mellitus). Exercising is not only about losing weight. It has many health benefits, such as increasing muscle strength and bone density and reducing body fat and stress. This leads to improved fitness, flexibility, and endurance, all of which result in better overall health. Exercise has additional benefits for people with diabetes, including:  Reducing appetite.  Helping to lower and control blood glucose.  Lowering blood pressure.  Helping to control amounts of fatty substances (lipids) in the blood, such as cholesterol and triglycerides.  Helping the body to respond better to insulin (improving insulin sensitivity).  Reducing how much insulin the body needs.  Decreasing the risk for heart disease by:  Lowering cholesterol and triglyceride levels.  Increasing the levels of good cholesterol.  Lowering blood glucose levels. What is my activity plan? Your health care provider or certified diabetes educator can help you make a plan for the type and frequency of exercise (activity plan) that works for you. Make sure that you:  Do at least 150 minutes of moderate-intensity or vigorous-intensity exercise each week. This could be brisk walking, biking, or water aerobics.  Do stretching and strength exercises, such as yoga or weightlifting, at least 2 times a week.  Spread out your activity over at least 3 days of the week.  Get some form of physical activity every  day.  Do not go more than 2 days in a row without some kind of physical activity.  Avoid being inactive for more than 90 minutes at a time. Take frequent breaks to walk or stretch.  Choose a type of exercise or activity that you enjoy, and set realistic goals.  Start slowly, and gradually increase the intensity of your exercise over time. What do I need to know about managing my diabetes?  Check your blood glucose before and after exercising.  If your blood glucose is higher than 240 mg/dL (13.3 mmol/L) before you exercise, check your urine for ketones. If you have ketones in your urine, do not exercise until your blood glucose returns to normal.  Know the symptoms of low blood glucose (hypoglycemia) and how to treat it. Your risk for hypoglycemia increases during and after exercise. Common symptoms of hypoglycemia can include:  Hunger.  Anxiety.  Sweating and feeling clammy.  Confusion.  Dizziness or feeling light-headed.  Increased heart rate or palpitations.  Blurry vision.  Tingling or numbness around the mouth, lips, or tongue.  Tremors or shakes.  Irritability.  Keep a rapid-acting carbohydrate snack available before, during, and after exercise to help prevent or treat hypoglycemia.  Avoid injecting insulin into areas of the body that are going to be exercised. For example, avoid injecting insulin into:  The arms, when playing tennis.  The legs, when jogging.  Keep records of your exercise habits. Doing this can help you and your health care provider adjust your diabetes management plan as needed. Write down:  Food that you eat before and after you exercise.  Blood glucose levels before and after you exercise.  The type and amount of exercise you have done.  When your insulin is expected to peak, if you use insulin. Avoid exercising at times when your insulin is peaking.  When you start a new exercise or activity, work with your health care provider to  make sure the activity is safe for you, and to adjust your insulin, medicines, or food intake as needed.  Drink plenty of water while you exercise to prevent dehydration or heat stroke. Drink enough fluid to keep your urine clear or pale yellow. This information is not intended to replace advice given to you by your health care provider. Make sure you discuss any questions you have with your health care provider. Document Released: 05/11/2003 Document Revised: 09/08/2015 Document Reviewed: 07/31/2015 Elsevier Interactive Patient Education  2017 [place].  Blood Glucose Monitoring, Adult Monitoring your blood sugar (glucose) helps you manage your diabetes. It also helps you and your health care provider determine how well your diabetes management plan is working. Blood glucose monitoring involves checking your blood glucose as often as directed, and  keeping a record (log) of your results over time. Why should I monitor my blood glucose? Checking your blood glucose regularly can:  Help you understand how food, exercise, illnesses, and medicines affect your blood glucose.  Let you know what your blood glucose is at any time. You can quickly tell if you are having low blood glucose (hypoglycemia) or high blood glucose (hyperglycemia).  Help you and your health care provider adjust your medicines as needed. When should I check my blood glucose? Follow instructions from your health care provider about how often to check your blood glucose. This may depend on:  The type of diabetes you have.  How well-controlled your diabetes is.  Medicines you are taking. If you have type 1 diabetes:   Check your blood glucose at least 2 times a day.  Also check your blood glucose:  Before every insulin injection.  Before and after exercise.  Between meals.  2 hours after a meal.  Occasionally between 2:00 a.m. and 3:00 a.m., as directed.  Before potentially dangerous tasks, like driving or  using heavy machinery.  At bedtime.  You may need to check your blood glucose more often, up to 6-10 times a day:  If you use an insulin pump.  If you need multiple daily injections (MDI).  If your diabetes is not well-controlled.  If you are ill.  If you have a history of severe hypoglycemia.  If you have a history of not knowing when your blood glucose is getting low (hypoglycemia unawareness). If you have type 2 diabetes:   If you take insulin or other diabetes medicines, check your blood glucose at least 2 times a day.  If you are on intensive insulin therapy, check your blood glucose at least 4 times a day. Occasionally, you may also need to check between 2:00 a.m. and 3:00 a.m., as directed.  Also check your blood glucose:  Before and after exercise.  Before potentially dangerous tasks, like driving or using heavy machinery.  You may need to check your blood glucose more often if:  Your medicine is being adjusted.  Your diabetes is not well-controlled.  You are ill. What is a blood glucose log?  A blood glucose log is a record of your blood glucose readings. It helps you and your health care provider:  Look for patterns in your blood glucose over time.  Adjust your diabetes management plan as needed.  Every time you check your blood glucose, write down your result and notes about things that may be affecting your blood glucose, such as your diet and exercise for the day.  Most glucose meters store a record of glucose readings in the meter. Some meters allow you to download your records to a computer. How do I check my blood glucose? Follow these steps to get accurate readings of your blood glucose: Supplies needed    Blood glucose meter.  Test strips for your meter. Each meter has its own strips. You must use the strips that come with your meter.  A needle to prick your finger (lancet). Do not use lancets more than once.  A device that holds the lancet  (lancing device).  A journal or log book to write down your results. Procedure   Wash your hands with soap and water.  Prick the side of your finger (not the tip) with the lancet. Use a different finger each time.  Gently rub the finger until a small drop of blood appears.  Follow instructions that  come with your meter for inserting the test strip, applying blood to the strip, and using your blood glucose meter.  Write down your result and any notes. Alternative testing sites   Some meters allow you to use areas of your body other than your finger (alternative sites) to test your blood.  If you think you may have hypoglycemia, or if you have hypoglycemia unawareness, do not use alternative sites. Use your finger instead.  Alternative sites may not be as accurate as the fingers, because blood flow is slower in these areas. This means that the result you get may be delayed, and it may be different from the result that you would get from your finger.  The most common alternative sites are:  Forearm.  Thigh.  Palm of the hand. Additional tips   Always keep your supplies with you.  If you have questions or need help, all blood glucose meters have a 24-hour "hotline" number that you can call. You may also contact your health care provider.  After you use a few boxes of test strips, adjust (calibrate) your blood glucose meter by following instructions that came with your meter. This information is not intended to replace advice given to you by your health care provider. Make sure you discuss any questions you have with your health care provider. Document Released: 02/21/2003 Document Revised: 09/08/2015 Document Reviewed: 07/31/2015 Elsevier Interactive Patient Education  2017 [employer].

## 2016-08-01 LAB — HCV COMMENT:

## 2016-08-01 LAB — HEPATITIS C ANTIBODY (REFLEX): HCV Ab: <0.1 s/co ratio (ref 0.0–0.9)

## 2016-08-01 LAB — HIV ANTIBODY (ROUTINE TESTING W REFLEX): HIV SCREEN 4TH GENERATION: NONREACTIVE

## 2016-08-02 LAB — CERVICOVAGINAL ANCILLARY ONLY
CHLAMYDIA, DNA PROBE: NEGATIVE
Neisseria Gonorrhea: NEGATIVE
Wet Prep (BD Affirm): POSITIVE — AB

## 2016-08-02 MED FILL — DULoxetine HCL 30 MG CPEP: 30 | 30 days supply | Qty: 60 | Fill #3

## 2016-08-02 MED FILL — GABAPENTIN 400 MG CAPSULE: 400 | 30 days supply | Qty: 90 | Fill #1

## 2016-08-06 LAB — CYTOLOGY - PAP
Diagnosis: NEGATIVE
HPV: NOT DETECTED

## 2016-08-08 ENCOUNTER — Ambulatory Visit (INDEPENDENT_AMBULATORY_CARE_PROVIDER_SITE_OTHER): Payer: No Typology Code available for payment source | Admitting: Psychiatry

## 2016-08-08 VITALS — BP 131/90 | HR 100 | Ht 59.0 in | Wt 194.6 lb

## 2016-08-08 DIAGNOSIS — F320 Major depressive disorder, single episode, mild: Secondary | ICD-10-CM

## 2016-08-08 DIAGNOSIS — Z79899 Other long term (current) drug therapy: Secondary | ICD-10-CM

## 2016-08-08 DIAGNOSIS — Z7982 Long term (current) use of aspirin: Secondary | ICD-10-CM

## 2016-08-08 DIAGNOSIS — F4323 Adjustment disorder with mixed anxiety and depressed mood: Secondary | ICD-10-CM

## 2016-08-08 DIAGNOSIS — Z888 Allergy status to other drugs, medicaments and biological substances status: Secondary | ICD-10-CM

## 2016-08-08 DIAGNOSIS — F17200 Nicotine dependence, unspecified, uncomplicated: Secondary | ICD-10-CM

## 2016-08-08 DIAGNOSIS — Z811 Family history of alcohol abuse and dependence: Secondary | ICD-10-CM

## 2016-08-08 MED ORDER — TRAZODONE HCL 100 MG PO TABS: ORAL_TABLET | ORAL | 5 refills | Status: AC

## 2016-08-08 MED ORDER — DULOXETINE HCL 30 MG PO CPEP: ORAL_CAPSULE | ORAL | 3 refills | Status: AC

## 2016-08-08 MED ORDER — HYDROXYZINE PAMOATE 25 MG PO CAPS: ORAL_CAPSULE | ORAL | 5 refills | Status: AC
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
## 2016-08-14 MED FILL — FARXIGA 10 MG TABLET: 10 | 30 days supply | Qty: 30 | Fill #1

## 2016-08-14 MED FILL — HYDROCHLOROTHIAZIDE 25 MG T: 25 | 30 days supply | Qty: 30 | Fill #1

## 2016-08-14 MED FILL — metFORMIN HCL 1000 MG TABS: 1000 | 30 days supply | Qty: 60 | Fill #2

## 2016-08-14 MED FILL — glipiZIDE 10 MG TABS: 10 | 30 days supply | Qty: 60 | Fill #2

## 2016-08-14 MED FILL — traZODone HCL 100 MG TABS: 100 | 30 days supply | Qty: 90 | Fill #1

## 2016-08-14 MED FILL — ATORVASTATIN 10 MG TABLET: 10 | 30 days supply | Qty: 30 | Fill #2

## 2016-08-15 ENCOUNTER — Telehealth: Payer: Self-pay | Admitting: *Deleted

## 2016-08-15 NOTE — Telephone Encounter (Signed)
-----   Message from Tresa Garter, MD sent at 07/23/2016 10:33 AM EDT ----- Lab results are normal except for slightly low vitamin D. Please take your vitamin D supplement as prescribed.

## 2016-08-15 NOTE — Telephone Encounter (Signed)
Patient verified DOB Patient is aware of yeast being detected and patient was treated with vaginal gel. Patient is aware of vitamin d level being low and a supplement has been provided. Patient had no further questions at this time.

## 2016-09-03 ENCOUNTER — Telehealth (INDEPENDENT_AMBULATORY_CARE_PROVIDER_SITE_OTHER): Payer: Self-pay | Admitting: *Deleted

## 2016-09-03 NOTE — Telephone Encounter (Signed)
-----   Message from Tresa Garter, MD sent at 07/23/2016 10:33 AM EDT ----- Lab results are normal except for slightly low vitamin D. Please take your vitamin D supplement as prescribed.

## 2016-09-03 NOTE — Telephone Encounter (Signed)
She will have her daughter pick the hard copy up and mail it to her.

## 2016-09-03 NOTE — Telephone Encounter (Signed)
Patient verified DOB Patient is aware of vitamin d level being low.  Patient is no longer a resident of [place] and was treated in the new city.

## 2016-09-03 NOTE — Telephone Encounter (Signed)
Tramadol is not available for eprescription. Can she pick up the script?

## 2016-09-05 ENCOUNTER — Other Ambulatory Visit: Payer: Self-pay | Admitting: Internal Medicine

## 2016-09-05 MED ORDER — TRAMADOL HCL 50 MG PO TABS: 50.0000 mg | ORAL_TABLET | Freq: Three times a day (TID) | ORAL | 0 refills | Status: AC | PRN

## 2016-09-05 NOTE — Telephone Encounter (Signed)
Refilled this one time only. Thanks

## 2016-10-09 ENCOUNTER — Encounter (HOSPITAL_COMMUNITY): Payer: Self-pay | Admitting: Psychology

## 2016-10-09 NOTE — Progress Notes (Signed)
Joy Solis is a 51 y.o. female patient discharged from counseling as last seen on 04/11/16 .   Outpatient Therapist Discharge Summary  Joy Solis    Oct 01, 1965   Admission Date: 02/07/16   Discharge Date:  10/09/16 Reason for Discharge:  Not active Diagnosis:  MDD Comments:  Pt didn't return for services, 08/2016 psychiatrist note indicates she is moving out of state.  Jenne Campus, LPC

## 2017-02-16 IMAGING — DX DG KNEE COMPLETE 4+V*R*
4 series · 4 of 4 positions shown · non-contrast
Comparison: 06/03/2010

CLINICAL DATA: Right knee pain for 1 week. No known injury.
Difficulty walking.

EXAM:
RIGHT KNEE - COMPLETE 4+ VIEW

[t knee ap right]
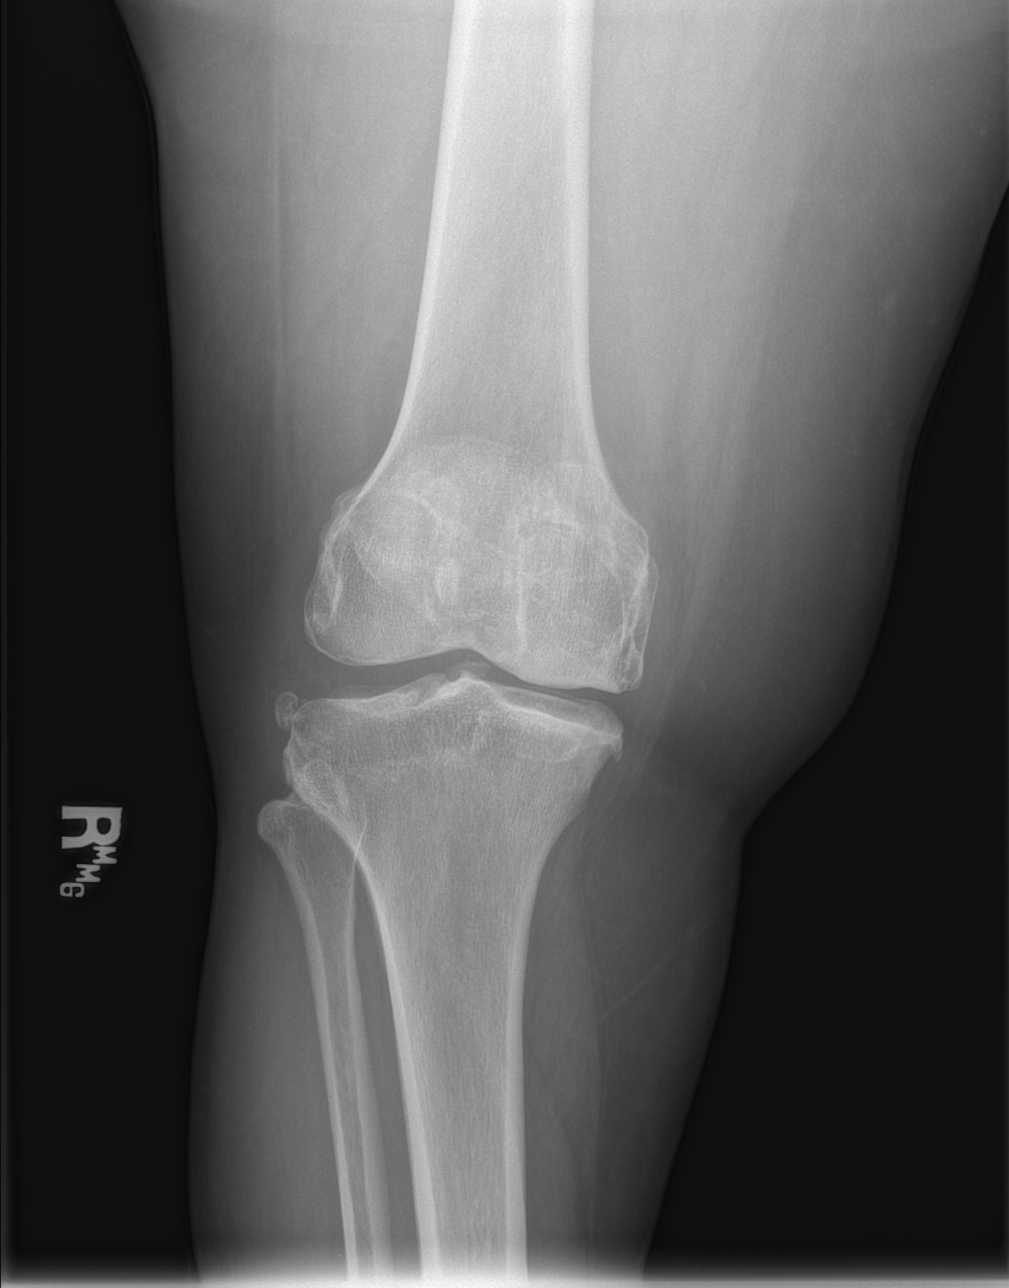

[t knee obl right]
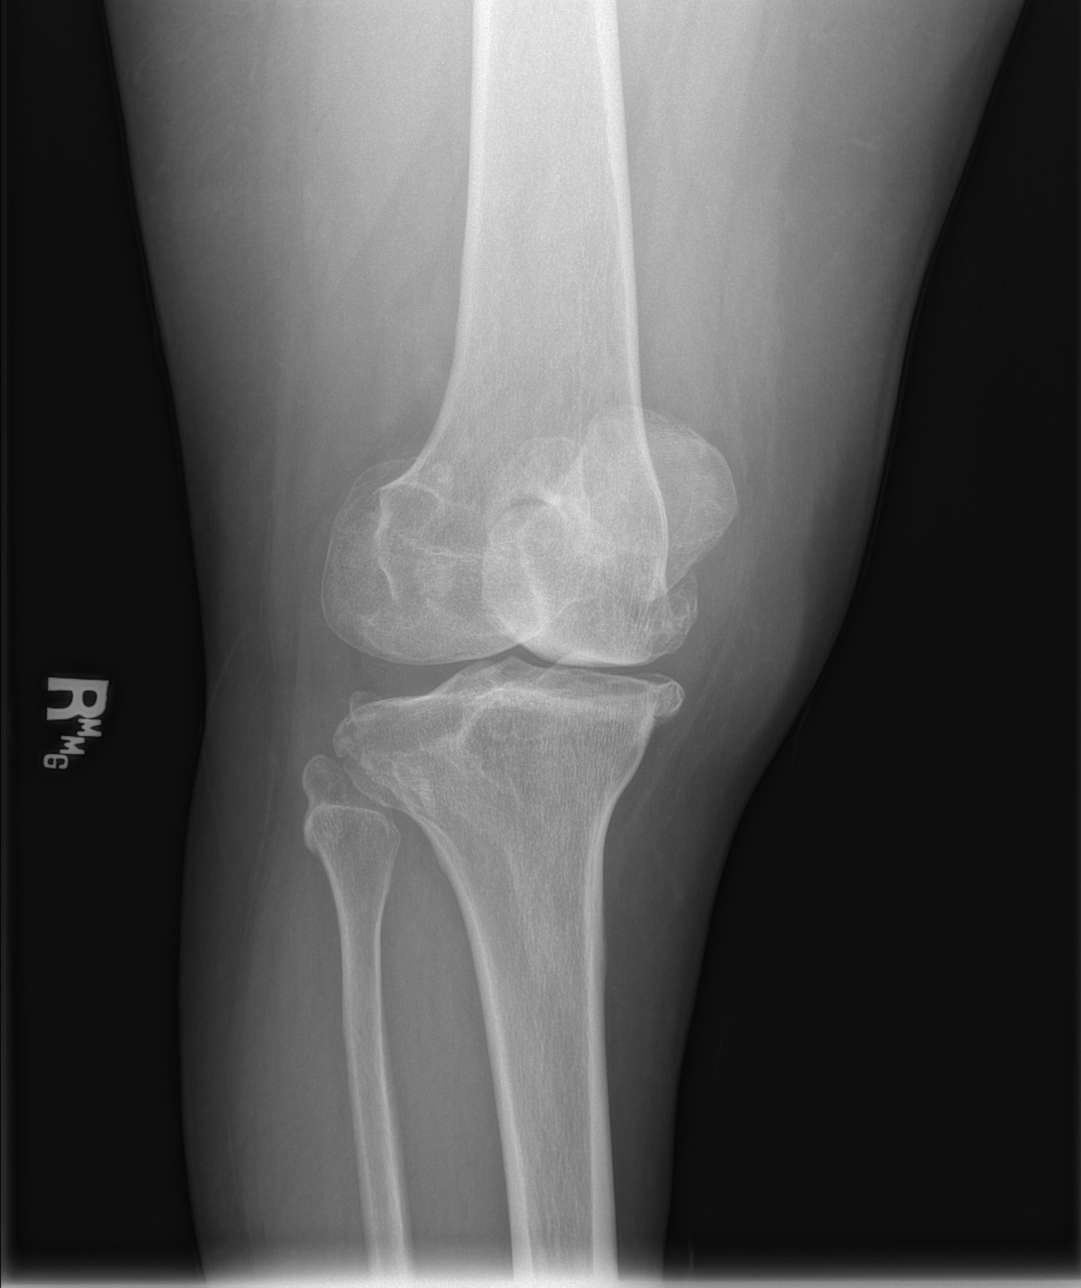

[t knee lat right (1 of 2)]
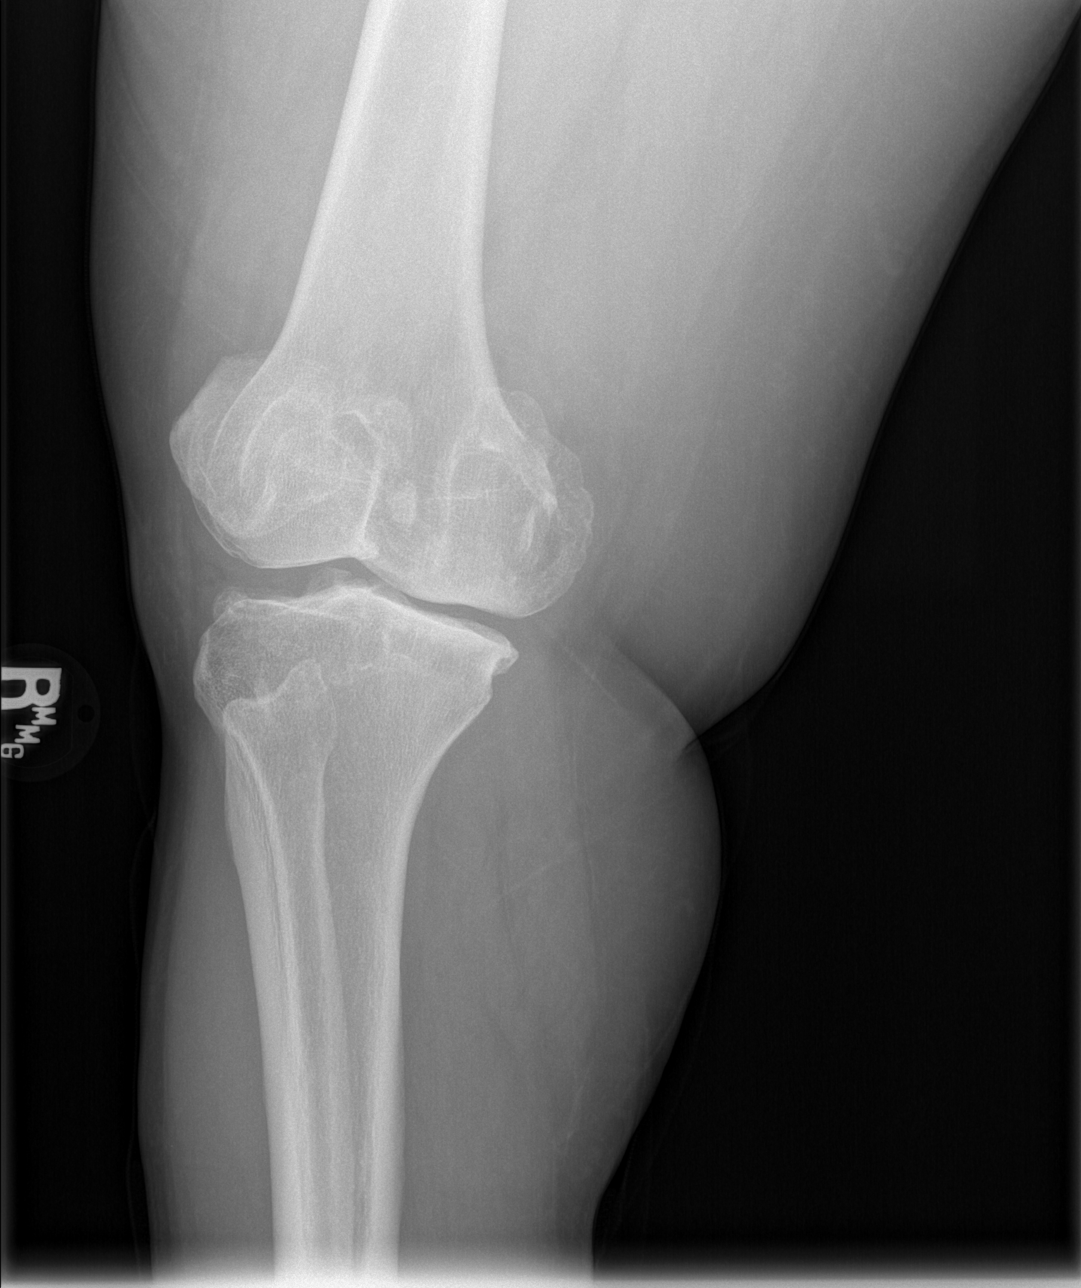

[t knee lat right (2 of 2)]
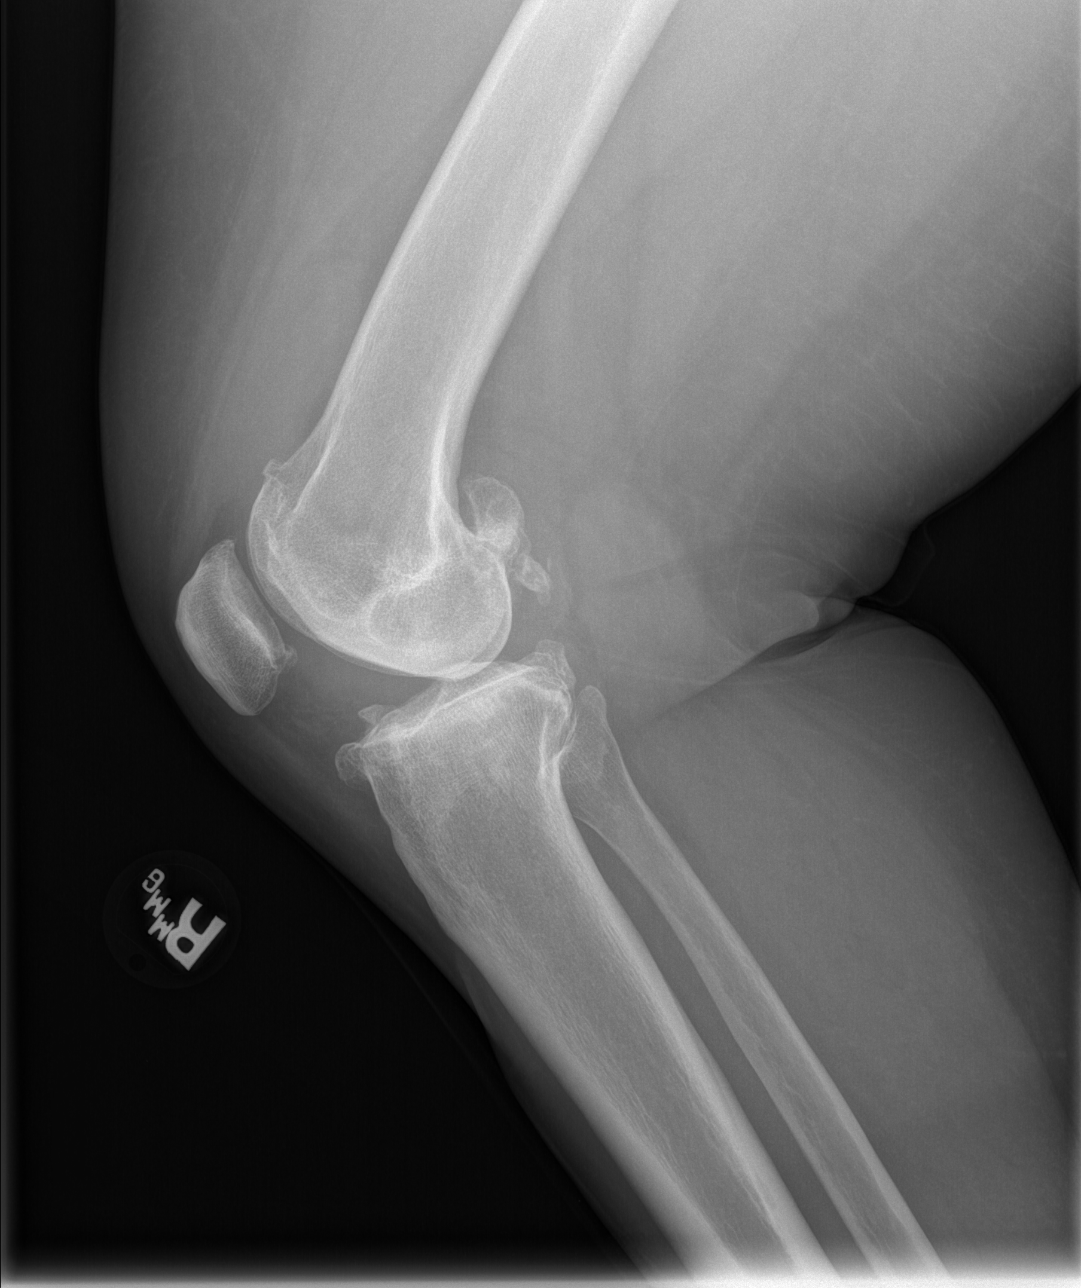

[4 of 4 positions shown; findings below may reference images not displayed]

FINDINGS: No fracture or dislocation. There is tricompartmental osteoarthritis
that has progressed from prior exam. Mild joint space narrowing of
the medial tibial femoral compartment with peripheral osteophytes.
Peripheral osteophytes involving the patellofemoral and lateral
tibial femoral compartment. Osseous densities posterior to the
femoral condyles may reflect fragments osteophyte versus
intra-articular bodies. There is a moderate joint effusion.
IMPRESSION: Moderate tricompartmental osteoarthritis without acute bony
abnormality. Moderate joint effusion.

## 2018-02-11 IMAGING — DX DG CHEST 2V
2 series · 2 of 2 positions shown · non-contrast
Comparison: 10/24/2013.

CLINICAL DATA: Positive PPD.

EXAM:
CHEST  2 VIEW

[chest pa]
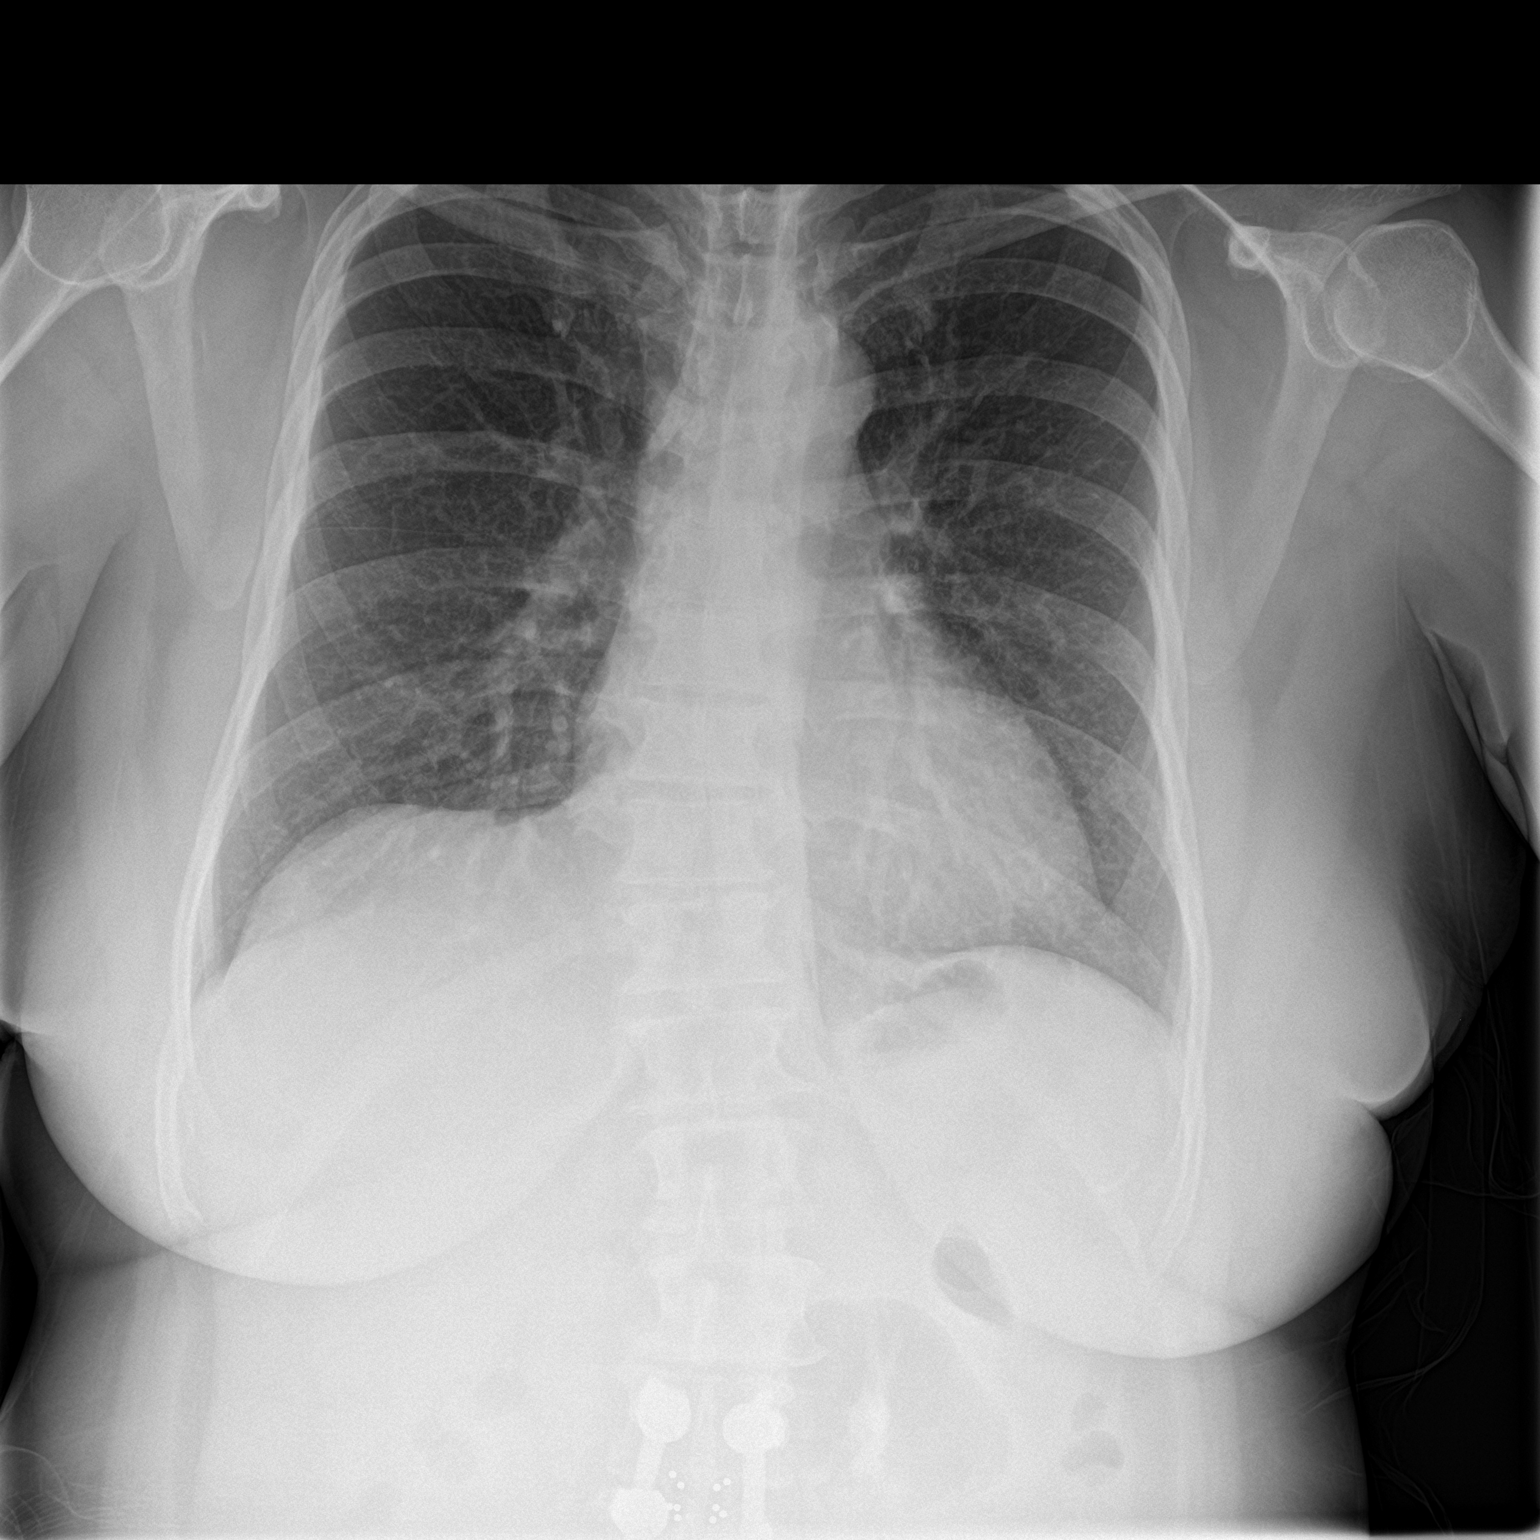

[chest lat]
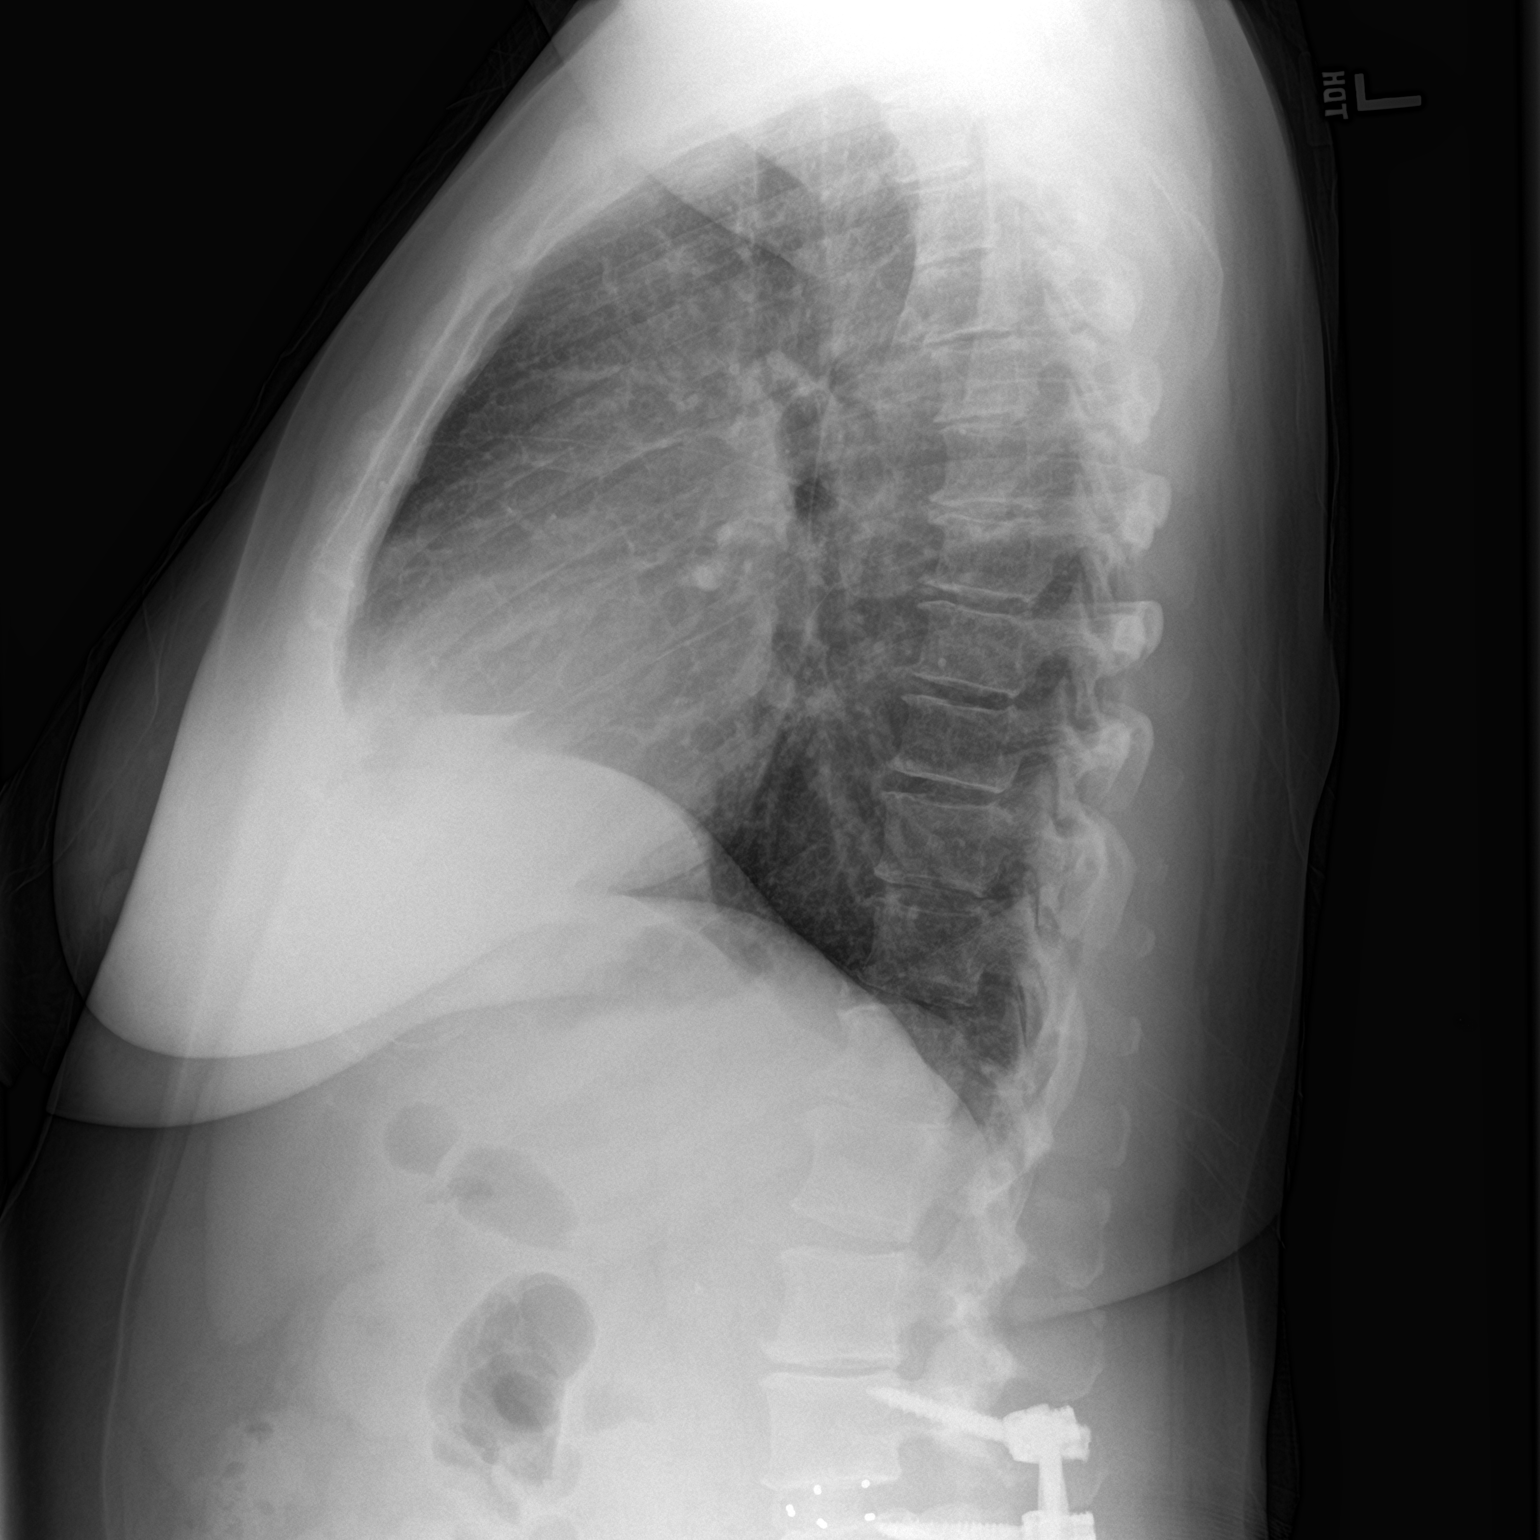

[2 of 2 positions shown; findings below may reference images not displayed]

FINDINGS: Mediastinum and hilar structures normal. Low lung volumes with mild
bibasilar atelectasis. Stable chronic mild interstitial prominence .
No pleural effusion or pneumothorax. Thoracic spine scoliosis. Prior
lumbar spine fusion .
IMPRESSION: Low lung volumes with mild bibasilar atelectasis. Stable chronic
mild interstitial prominence. No acute pulmonary infiltrates
present.

## 2021-01-30 ENCOUNTER — Encounter: Payer: Self-pay | Admitting: Internal Medicine
